# Patient Record
Sex: Male | Born: 1945 | Race: Black or African American | Hispanic: No | Marital: Single | Smoking: Former smoker
Health system: Southern US, Community
[De-identification: ages and names within clinical notes are randomized; demographics above are authoritative.]

## PROBLEM LIST (undated history)

## (undated) DIAGNOSIS — C801 Malignant (primary) neoplasm, unspecified: Secondary | ICD-10-CM

## (undated) DIAGNOSIS — E119 Type 2 diabetes mellitus without complications: Secondary | ICD-10-CM

## (undated) DIAGNOSIS — I10 Essential (primary) hypertension: Secondary | ICD-10-CM

## (undated) HISTORY — PX: HERNIA REPAIR: SHX51

---

## 2003-11-15 ENCOUNTER — Ambulatory Visit: Payer: Self-pay | Admitting: Family Medicine

## 2003-11-16 ENCOUNTER — Ambulatory Visit: Payer: Self-pay | Admitting: *Deleted

## 2003-12-10 ENCOUNTER — Ambulatory Visit: Payer: Self-pay | Admitting: *Deleted

## 2003-12-10 ENCOUNTER — Ambulatory Visit: Payer: Self-pay | Admitting: Family Medicine

## 2003-12-12 ENCOUNTER — Ambulatory Visit: Payer: Self-pay | Admitting: Family Medicine

## 2005-08-20 ENCOUNTER — Emergency Department (HOSPITAL_COMMUNITY): Admission: EM | Admit: 2005-08-20 | Discharge: 2005-08-20 | Payer: Self-pay | Admitting: Emergency Medicine

## 2006-01-07 ENCOUNTER — Emergency Department (HOSPITAL_COMMUNITY): Admission: EM | Admit: 2006-01-07 | Discharge: 2006-01-07 | Payer: Self-pay | Admitting: Emergency Medicine

## 2007-09-18 ENCOUNTER — Emergency Department (HOSPITAL_COMMUNITY): Admission: EM | Admit: 2007-09-18 | Discharge: 2007-09-18 | Payer: Self-pay | Admitting: Emergency Medicine

## 2014-11-23 ENCOUNTER — Emergency Department (HOSPITAL_COMMUNITY): Payer: No Typology Code available for payment source

## 2014-11-23 ENCOUNTER — Emergency Department (HOSPITAL_COMMUNITY)
Admission: EM | Admit: 2014-11-23 | Discharge: 2014-11-23 | Disposition: A | Discharge status: Home / Self Care | Payer: No Typology Code available for payment source | Attending: Emergency Medicine | Admitting: Emergency Medicine

## 2014-11-23 ENCOUNTER — Encounter (HOSPITAL_COMMUNITY): Payer: Self-pay | Admitting: Emergency Medicine

## 2014-11-23 DIAGNOSIS — Y998 Other external cause status: Secondary | ICD-10-CM | POA: Diagnosis not present

## 2014-11-23 DIAGNOSIS — E119 Type 2 diabetes mellitus without complications: Secondary | ICD-10-CM | POA: Diagnosis not present

## 2014-11-23 DIAGNOSIS — Y92410 Unspecified street and highway as the place of occurrence of the external cause: Secondary | ICD-10-CM | POA: Insufficient documentation

## 2014-11-23 DIAGNOSIS — S6991XA Unspecified injury of right wrist, hand and finger(s), initial encounter: Secondary | ICD-10-CM | POA: Diagnosis not present

## 2014-11-23 DIAGNOSIS — S161XXA Strain of muscle, fascia and tendon at neck level, initial encounter: Secondary | ICD-10-CM | POA: Diagnosis not present

## 2014-11-23 DIAGNOSIS — S199XXA Unspecified injury of neck, initial encounter: Secondary | ICD-10-CM | POA: Diagnosis present

## 2014-11-23 DIAGNOSIS — Z87891 Personal history of nicotine dependence: Secondary | ICD-10-CM | POA: Diagnosis not present

## 2014-11-23 DIAGNOSIS — Y9389 Activity, other specified: Secondary | ICD-10-CM | POA: Insufficient documentation

## 2014-11-23 DIAGNOSIS — S39012A Strain of muscle, fascia and tendon of lower back, initial encounter: Secondary | ICD-10-CM | POA: Insufficient documentation

## 2014-11-23 DIAGNOSIS — I10 Essential (primary) hypertension: Secondary | ICD-10-CM | POA: Insufficient documentation

## 2014-11-23 HISTORY — DX: Type 2 diabetes mellitus without complications: E11.9

## 2014-11-23 HISTORY — DX: Essential (primary) hypertension: I10

## 2014-11-23 MED ORDER — NAPROXEN 375 MG PO TABS: 375.0000 mg | ORAL_TABLET | Freq: Two times a day (BID) | ORAL | Status: DC

## 2014-11-23 MED ORDER — HYDROCODONE-ACETAMINOPHEN 5-325 MG PO TABS: 1.0000 | ORAL_TABLET | Freq: Four times a day (QID) | ORAL | Status: DC | PRN

## 2014-11-23 MED ORDER — CYCLOBENZAPRINE HCL 5 MG PO TABS: 5.0000 mg | ORAL_TABLET | Freq: Two times a day (BID) | ORAL | Status: DC | PRN
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
## 2014-11-23 NOTE — Discharge Instructions (Signed)
Do not drive when taking the muscle relaxant because it will make you sleepy

## 2014-11-23 NOTE — ED Notes (Signed)
Pt transported to xray 

## 2014-11-28 ENCOUNTER — Encounter (HOSPITAL_COMMUNITY): Payer: Self-pay | Admitting: Neurology

## 2014-11-28 ENCOUNTER — Emergency Department (HOSPITAL_COMMUNITY)
Admission: EM | Admit: 2014-11-28 | Discharge: 2014-11-28 | Disposition: A | Discharge status: Home / Self Care | Payer: No Typology Code available for payment source | Attending: Emergency Medicine | Admitting: Emergency Medicine

## 2014-11-28 DIAGNOSIS — E119 Type 2 diabetes mellitus without complications: Secondary | ICD-10-CM | POA: Diagnosis not present

## 2014-11-28 DIAGNOSIS — M542 Cervicalgia: Secondary | ICD-10-CM | POA: Diagnosis not present

## 2014-11-28 DIAGNOSIS — Z760 Encounter for issue of repeat prescription: Secondary | ICD-10-CM | POA: Diagnosis present

## 2014-11-28 DIAGNOSIS — I10 Essential (primary) hypertension: Secondary | ICD-10-CM | POA: Insufficient documentation

## 2014-11-28 NOTE — ED Notes (Signed)
Pt walked out saying to send him the bill.  left
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
## 2014-11-28 NOTE — ED Notes (Signed)
Not happy about waiting

## 2017-05-30 ENCOUNTER — Emergency Department (HOSPITAL_COMMUNITY): Payer: Non-veteran care

## 2017-05-30 ENCOUNTER — Inpatient Hospital Stay (HOSPITAL_COMMUNITY)
Admission: EM | Admit: 2017-05-30 | Discharge: 2017-07-06 | DRG: 011 | Disposition: A | Discharge status: Home Health | Payer: Non-veteran care | Attending: Otolaryngology | Admitting: Otolaryngology

## 2017-05-30 ENCOUNTER — Encounter (HOSPITAL_COMMUNITY)
Admission: EM | Disposition: A | Discharge status: Home Health | Payer: Self-pay | Source: Home / Self Care | Attending: Otolaryngology

## 2017-05-30 ENCOUNTER — Encounter (HOSPITAL_COMMUNITY): Payer: Self-pay | Admitting: Certified Registered Nurse Anesthetist

## 2017-05-30 ENCOUNTER — Emergency Department (HOSPITAL_COMMUNITY): Payer: Non-veteran care | Admitting: Certified Registered Nurse Anesthetist

## 2017-05-30 ENCOUNTER — Other Ambulatory Visit: Payer: Self-pay

## 2017-05-30 ENCOUNTER — Inpatient Hospital Stay (HOSPITAL_COMMUNITY): Payer: Non-veteran care

## 2017-05-30 ENCOUNTER — Encounter (HOSPITAL_COMMUNITY): Payer: Self-pay

## 2017-05-30 DIAGNOSIS — Z9002 Acquired absence of larynx: Secondary | ICD-10-CM

## 2017-05-30 DIAGNOSIS — Z87891 Personal history of nicotine dependence: Secondary | ICD-10-CM

## 2017-05-30 DIAGNOSIS — J387 Other diseases of larynx: Secondary | ICD-10-CM | POA: Diagnosis not present

## 2017-05-30 DIAGNOSIS — R451 Restlessness and agitation: Secondary | ICD-10-CM | POA: Diagnosis not present

## 2017-05-30 DIAGNOSIS — E876 Hypokalemia: Secondary | ICD-10-CM | POA: Diagnosis not present

## 2017-05-30 DIAGNOSIS — Z79891 Long term (current) use of opiate analgesic: Secondary | ICD-10-CM

## 2017-05-30 DIAGNOSIS — R1312 Dysphagia, oropharyngeal phase: Secondary | ICD-10-CM | POA: Diagnosis present

## 2017-05-30 DIAGNOSIS — R933 Abnormal findings on diagnostic imaging of other parts of digestive tract: Secondary | ICD-10-CM

## 2017-05-30 DIAGNOSIS — J9509 Other tracheostomy complication: Secondary | ICD-10-CM | POA: Diagnosis not present

## 2017-05-30 DIAGNOSIS — E1165 Type 2 diabetes mellitus with hyperglycemia: Secondary | ICD-10-CM | POA: Diagnosis not present

## 2017-05-30 DIAGNOSIS — K0530 Chronic periodontitis, unspecified: Secondary | ICD-10-CM | POA: Diagnosis present

## 2017-05-30 DIAGNOSIS — N400 Enlarged prostate without lower urinary tract symptoms: Secondary | ICD-10-CM | POA: Diagnosis present

## 2017-05-30 DIAGNOSIS — R440 Auditory hallucinations: Secondary | ICD-10-CM | POA: Diagnosis present

## 2017-05-30 DIAGNOSIS — R443 Hallucinations, unspecified: Secondary | ICD-10-CM | POA: Diagnosis present

## 2017-05-30 DIAGNOSIS — Z008 Encounter for other general examination: Secondary | ICD-10-CM | POA: Diagnosis not present

## 2017-05-30 DIAGNOSIS — K5900 Constipation, unspecified: Secondary | ICD-10-CM | POA: Diagnosis not present

## 2017-05-30 DIAGNOSIS — K029 Dental caries, unspecified: Secondary | ICD-10-CM | POA: Diagnosis present

## 2017-05-30 DIAGNOSIS — Z6820 Body mass index (BMI) 20.0-20.9, adult: Secondary | ICD-10-CM

## 2017-05-30 DIAGNOSIS — R441 Visual hallucinations: Secondary | ICD-10-CM | POA: Diagnosis present

## 2017-05-30 DIAGNOSIS — F4310 Post-traumatic stress disorder, unspecified: Secondary | ICD-10-CM | POA: Diagnosis present

## 2017-05-30 DIAGNOSIS — Y838 Other surgical procedures as the cause of abnormal reaction of the patient, or of later complication, without mention of misadventure at the time of the procedure: Secondary | ICD-10-CM | POA: Diagnosis not present

## 2017-05-30 DIAGNOSIS — Z930 Tracheostomy status: Secondary | ICD-10-CM | POA: Diagnosis not present

## 2017-05-30 DIAGNOSIS — J9690 Respiratory failure, unspecified, unspecified whether with hypoxia or hypercapnia: Secondary | ICD-10-CM

## 2017-05-30 DIAGNOSIS — J432 Centrilobular emphysema: Secondary | ICD-10-CM | POA: Diagnosis not present

## 2017-05-30 DIAGNOSIS — T819XXA Unspecified complication of procedure, initial encounter: Secondary | ICD-10-CM | POA: Diagnosis present

## 2017-05-30 DIAGNOSIS — J9601 Acute respiratory failure with hypoxia: Secondary | ICD-10-CM | POA: Diagnosis present

## 2017-05-30 DIAGNOSIS — J9691 Respiratory failure, unspecified with hypoxia: Secondary | ICD-10-CM | POA: Diagnosis present

## 2017-05-30 DIAGNOSIS — R1313 Dysphagia, pharyngeal phase: Secondary | ICD-10-CM | POA: Diagnosis not present

## 2017-05-30 DIAGNOSIS — R0902 Hypoxemia: Secondary | ICD-10-CM

## 2017-05-30 DIAGNOSIS — F602 Antisocial personality disorder: Secondary | ICD-10-CM | POA: Diagnosis present

## 2017-05-30 DIAGNOSIS — E119 Type 2 diabetes mellitus without complications: Secondary | ICD-10-CM | POA: Diagnosis not present

## 2017-05-30 DIAGNOSIS — R0603 Acute respiratory distress: Secondary | ICD-10-CM

## 2017-05-30 DIAGNOSIS — I10 Essential (primary) hypertension: Secondary | ICD-10-CM | POA: Diagnosis present

## 2017-05-30 DIAGNOSIS — E440 Moderate protein-calorie malnutrition: Secondary | ICD-10-CM | POA: Diagnosis present

## 2017-05-30 DIAGNOSIS — Z7401 Bed confinement status: Secondary | ICD-10-CM

## 2017-05-30 DIAGNOSIS — Z01818 Encounter for other preprocedural examination: Secondary | ICD-10-CM | POA: Diagnosis not present

## 2017-05-30 DIAGNOSIS — T17390A Other foreign object in larynx causing asphyxiation, initial encounter: Secondary | ICD-10-CM | POA: Diagnosis not present

## 2017-05-30 DIAGNOSIS — Z79899 Other long term (current) drug therapy: Secondary | ICD-10-CM

## 2017-05-30 DIAGNOSIS — E11649 Type 2 diabetes mellitus with hypoglycemia without coma: Secondary | ICD-10-CM | POA: Diagnosis not present

## 2017-05-30 DIAGNOSIS — Z430 Encounter for attention to tracheostomy: Secondary | ICD-10-CM | POA: Diagnosis present

## 2017-05-30 DIAGNOSIS — R0600 Dyspnea, unspecified: Secondary | ICD-10-CM

## 2017-05-30 DIAGNOSIS — J449 Chronic obstructive pulmonary disease, unspecified: Secondary | ICD-10-CM | POA: Diagnosis present

## 2017-05-30 DIAGNOSIS — J9600 Acute respiratory failure, unspecified whether with hypoxia or hypercapnia: Secondary | ICD-10-CM

## 2017-05-30 DIAGNOSIS — Z972 Presence of dental prosthetic device (complete) (partial): Secondary | ICD-10-CM

## 2017-05-30 DIAGNOSIS — Z923 Personal history of irradiation: Secondary | ICD-10-CM

## 2017-05-30 DIAGNOSIS — Z781 Physical restraint status: Secondary | ICD-10-CM

## 2017-05-30 DIAGNOSIS — E870 Hyperosmolality and hypernatremia: Secondary | ICD-10-CM | POA: Diagnosis present

## 2017-05-30 DIAGNOSIS — K449 Diaphragmatic hernia without obstruction or gangrene: Secondary | ICD-10-CM

## 2017-05-30 DIAGNOSIS — R34 Anuria and oliguria: Secondary | ICD-10-CM

## 2017-05-30 DIAGNOSIS — C329 Malignant neoplasm of larynx, unspecified: Secondary | ICD-10-CM | POA: Diagnosis present

## 2017-05-30 DIAGNOSIS — R1310 Dysphagia, unspecified: Secondary | ICD-10-CM

## 2017-05-30 DIAGNOSIS — J9500 Unspecified tracheostomy complication: Secondary | ICD-10-CM

## 2017-05-30 HISTORY — PX: TRACHEOSTOMY TUBE PLACEMENT: SHX814

## 2017-05-30 LAB — COMPREHENSIVE METABOLIC PANEL
ALBUMIN: 4.3 g/dL (ref 3.5–5.0)
ALT: 43 U/L (ref 17–63)
AST: 61 U/L — AB (ref 15–41)
Alkaline Phosphatase: 86 U/L (ref 38–126)
Anion gap: 11 (ref 5–15)
BUN: 17 mg/dL (ref 6–20)
CHLORIDE: 95 mmol/L — AB (ref 101–111)
CO2: 34 mmol/L — ABNORMAL HIGH (ref 22–32)
Calcium: 10.2 mg/dL (ref 8.9–10.3)
Creatinine, Ser: 1.45 mg/dL — ABNORMAL HIGH (ref 0.61–1.24)
GFR calc Af Amer: 54 mL/min — ABNORMAL LOW (ref >60)
GFR calc non Af Amer: 47 mL/min — ABNORMAL LOW (ref >60)
GLUCOSE: 240 mg/dL — AB (ref 65–99)
POTASSIUM: 3.9 mmol/L (ref 3.5–5.1)
SODIUM: 140 mmol/L (ref 135–145)
Total Bilirubin: 0.6 mg/dL (ref 0.3–1.2)
Total Protein: 8 g/dL (ref 6.5–8.1)

## 2017-05-30 LAB — RAPID URINE DRUG SCREEN, HOSP PERFORMED
Amphetamines: NOT DETECTED
Barbiturates: NOT DETECTED
Benzodiazepines: NOT DETECTED
Cocaine: NOT DETECTED
OPIATES: POSITIVE — AB
TETRAHYDROCANNABINOL: NOT DETECTED

## 2017-05-30 LAB — CBC WITH DIFFERENTIAL/PLATELET
BASOS ABS: 0 10*3/uL (ref 0.0–0.1)
Basophils Relative: 0 %
EOS ABS: 0 10*3/uL (ref 0.0–0.7)
Eosinophils Relative: 0 %
HCT: 42.1 % (ref 39.0–52.0)
HEMOGLOBIN: 14.3 g/dL (ref 13.0–17.0)
LYMPHS ABS: 0.6 10*3/uL — AB (ref 0.7–4.0)
LYMPHS PCT: 8 %
MCH: 27.7 pg (ref 26.0–34.0)
MCHC: 34 g/dL (ref 30.0–36.0)
MCV: 81.4 fL (ref 78.0–100.0)
Monocytes Absolute: 0.7 10*3/uL (ref 0.1–1.0)
Monocytes Relative: 9 %
NEUTROS PCT: 83 %
Neutro Abs: 6.5 10*3/uL (ref 1.7–7.7)
Platelets: 271 10*3/uL (ref 150–400)
RBC: 5.17 MIL/uL (ref 4.22–5.81)
RDW: 13.1 % (ref 11.5–15.5)
WBC: 7.8 10*3/uL (ref 4.0–10.5)

## 2017-05-30 LAB — GLUCOSE, CAPILLARY
Glucose-Capillary: 311 mg/dL — ABNORMAL HIGH (ref 65–99)
Glucose-Capillary: 248 mg/dL — ABNORMAL HIGH (ref 65–99)

## 2017-05-30 LAB — BLOOD GAS, ARTERIAL
Acid-Base Excess: 2.3 mmol/L — ABNORMAL HIGH (ref 0.0–2.0)
BICARBONATE: 28.7 mmol/L — AB (ref 20.0–28.0)
Drawn by: 232811
FIO2: 60
MECHVT: 620 mL
O2 SAT: 94.1 %
PATIENT TEMPERATURE: 98.1
PCO2 ART: 54.1 mmHg — AB (ref 32.0–48.0)
PEEP: 5 cmH2O
PH ART: 7.342 — AB (ref 7.350–7.450)
PO2 ART: 76 mmHg — AB (ref 83.0–108.0)
RATE: 18 resp/min

## 2017-05-30 LAB — ACETAMINOPHEN LEVEL: Acetaminophen (Tylenol), Serum: <10 ug/mL — ABNORMAL LOW (ref 10–30)

## 2017-05-30 LAB — URINALYSIS, ROUTINE W REFLEX MICROSCOPIC
Bilirubin Urine: NEGATIVE
Glucose, UA: >=500 mg/dL — AB
KETONES UR: 5 mg/dL — AB
NITRITE: NEGATIVE
Specific Gravity, Urine: 1.02 (ref 1.005–1.030)
pH: 5 (ref 5.0–8.0)

## 2017-05-30 LAB — I-STAT CG4 LACTIC ACID, ED: LACTIC ACID, VENOUS: 1.11 mmol/L (ref 0.5–1.9)

## 2017-05-30 LAB — SALICYLATE LEVEL

## 2017-05-30 LAB — I-STAT TROPONIN, ED: TROPONIN I, POC: 0.01 ng/mL (ref 0.00–0.08)

## 2017-05-30 LAB — ETHANOL: Alcohol, Ethyl (B): <10 mg/dL (ref <10)

## 2017-05-30 LAB — BRAIN NATRIURETIC PEPTIDE: B NATRIURETIC PEPTIDE 5: 56.8 pg/mL (ref 0.0–100.0)

## 2017-05-30 LAB — TRIGLYCERIDES: Triglycerides: 64 mg/dL (ref <150)

## 2017-05-30 LAB — MRSA PCR SCREENING: MRSA by PCR: NEGATIVE

## 2017-05-30 SURGERY — CREATION, TRACHEOSTOMY
Anesthesia: General | Site: Neck

## 2017-05-30 MED ORDER — FENTANYL CITRATE (PF) 100 MCG/2ML IJ SOLN: 50.0000 ug | INTRAMUSCULAR | Status: DC | PRN

## 2017-05-30 MED ORDER — DEXMEDETOMIDINE HCL 200 MCG/2ML IV SOLN
INTRAVENOUS | Status: DC | PRN
  Administered 2017-05-30: 6 ug via INTRAVENOUS
  Administered 2017-05-30: 2 ug via INTRAVENOUS

## 2017-05-30 MED ORDER — LACTATED RINGERS IV SOLN
INTRAVENOUS | Status: DC | PRN
  Administered 2017-05-30: 18:00:00 via INTRAVENOUS

## 2017-05-30 MED ORDER — CHLORHEXIDINE GLUCONATE 0.12% ORAL RINSE (MEDLINE KIT)
15.0000 mL | Freq: Two times a day (BID) | OROMUCOSAL | Status: DC
  Administered 2017-05-30 – 2017-06-23 (×43): 15 mL via OROMUCOSAL

## 2017-05-30 MED ORDER — LIDOCAINE-EPINEPHRINE 1 %-1:100000 IJ SOLN
INTRAMUSCULAR | Status: AC
  Filled 2017-05-30: qty 1

## 2017-05-30 MED ORDER — METHYLPREDNISOLONE SODIUM SUCC 125 MG IJ SOLR
125.0000 mg | Freq: Once | INTRAMUSCULAR | Status: AC
  Administered 2017-05-30: 125 mg via INTRAVENOUS
  Filled 2017-05-30: qty 2

## 2017-05-30 MED ORDER — PANTOPRAZOLE SODIUM 40 MG IV SOLR
40.0000 mg | INTRAVENOUS | Status: DC
  Administered 2017-05-30 – 2017-06-10 (×12): 40 mg via INTRAVENOUS
  Filled 2017-05-30 (×12): qty 40

## 2017-05-30 MED ORDER — ROCURONIUM BROMIDE 10 MG/ML (PF) SYRINGE
PREFILLED_SYRINGE | INTRAVENOUS | Status: AC
  Filled 2017-05-30: qty 10

## 2017-05-30 MED ORDER — KETAMINE HCL 10 MG/ML IJ SOLN: 1.0000 mg/kg | INTRAMUSCULAR | Status: DC | PRN

## 2017-05-30 MED ORDER — RACEPINEPHRINE HCL 2.25 % IN NEBU
INHALATION_SOLUTION | RESPIRATORY_TRACT | Status: AC
  Filled 2017-05-30: qty 0.5

## 2017-05-30 MED ORDER — LIDOCAINE-EPINEPHRINE 1 %-1:100000 IJ SOLN
INTRAMUSCULAR | Status: DC | PRN
  Administered 2017-05-30: 6 mL

## 2017-05-30 MED ORDER — IPRATROPIUM-ALBUTEROL 0.5-2.5 (3) MG/3ML IN SOLN
3.0000 mL | Freq: Once | RESPIRATORY_TRACT | Status: AC
  Administered 2017-05-30: 3 mL via RESPIRATORY_TRACT
  Filled 2017-05-30: qty 3

## 2017-05-30 MED ORDER — PHENYLEPHRINE HCL 10 MG/ML IJ SOLN
INTRAMUSCULAR | Status: DC | PRN
  Administered 2017-05-30: 100 ug via INTRAVENOUS
  Administered 2017-05-30: 80 ug via INTRAVENOUS
  Administered 2017-05-30: 100 ug via INTRAVENOUS

## 2017-05-30 MED ORDER — IPRATROPIUM-ALBUTEROL 0.5-2.5 (3) MG/3ML IN SOLN
RESPIRATORY_TRACT | Status: AC
  Filled 2017-05-30: qty 3

## 2017-05-30 MED ORDER — HALOPERIDOL LACTATE 5 MG/ML IJ SOLN
2.0000 mg | Freq: Once | INTRAMUSCULAR | Status: AC
  Administered 2017-05-30: 2 mg via INTRAVENOUS
  Filled 2017-05-30: qty 1

## 2017-05-30 MED ORDER — RACEPINEPHRINE HCL 2.25 % IN NEBU
0.5000 mL | INHALATION_SOLUTION | Freq: Once | RESPIRATORY_TRACT | Status: AC
  Administered 2017-05-30: 0.5 mL via RESPIRATORY_TRACT

## 2017-05-30 MED ORDER — PROPOFOL 1000 MG/100ML IV EMUL
5.0000 ug/kg/min | INTRAVENOUS | Status: DC
  Administered 2017-05-30: 20 ug/kg/min via INTRAVENOUS
  Administered 2017-05-31: 25 ug/kg/min via INTRAVENOUS
  Filled 2017-05-30 (×3): qty 100

## 2017-05-30 MED ORDER — IOPAMIDOL (ISOVUE-370) INJECTION 76%
INTRAVENOUS | Status: AC
  Administered 2017-05-30: 100 mL via INTRAVENOUS
  Filled 2017-05-30: qty 100

## 2017-05-30 MED ORDER — BUDESONIDE 0.5 MG/2ML IN SUSP
0.5000 mg | Freq: Two times a day (BID) | RESPIRATORY_TRACT | Status: DC
  Administered 2017-05-30 – 2017-06-23 (×48): 0.5 mg via RESPIRATORY_TRACT
  Filled 2017-05-30 (×48): qty 2

## 2017-05-30 MED ORDER — ARFORMOTEROL TARTRATE 15 MCG/2ML IN NEBU
15.0000 ug | INHALATION_SOLUTION | Freq: Two times a day (BID) | RESPIRATORY_TRACT | Status: DC
  Administered 2017-05-30 – 2017-06-23 (×48): 15 ug via RESPIRATORY_TRACT
  Filled 2017-05-30 (×48): qty 2

## 2017-05-30 MED ORDER — IPRATROPIUM-ALBUTEROL 0.5-2.5 (3) MG/3ML IN SOLN
3.0000 mL | Freq: Four times a day (QID) | RESPIRATORY_TRACT | Status: DC
  Administered 2017-05-30 – 2017-05-31 (×3): 3 mL via RESPIRATORY_TRACT
  Filled 2017-05-30 (×2): qty 3

## 2017-05-30 MED ORDER — KETAMINE HCL 10 MG/ML IJ SOLN
INTRAMUSCULAR | Status: DC | PRN
  Administered 2017-05-30: 10 mg via INTRAVENOUS
  Administered 2017-05-30: 5 mg via INTRAVENOUS

## 2017-05-30 MED ORDER — ROCURONIUM BROMIDE 100 MG/10ML IV SOLN
INTRAVENOUS | Status: DC | PRN
  Administered 2017-05-30: 30 mg via INTRAVENOUS
  Administered 2017-05-30: 20 mg via INTRAVENOUS

## 2017-05-30 MED ORDER — PROPOFOL 10 MG/ML IV BOLUS
INTRAVENOUS | Status: AC
  Filled 2017-05-30: qty 40

## 2017-05-30 MED ORDER — FENTANYL CITRATE (PF) 100 MCG/2ML IJ SOLN
50.0000 ug | INTRAMUSCULAR | Status: DC | PRN
  Administered 2017-05-31: 50 ug via INTRAVENOUS
  Filled 2017-05-30: qty 2

## 2017-05-30 MED ORDER — DEXMEDETOMIDINE HCL IN NACL 200 MCG/50ML IV SOLN
INTRAVENOUS | Status: AC
  Filled 2017-05-30: qty 50

## 2017-05-30 MED ORDER — MAGNESIUM SULFATE 2 GM/50ML IV SOLN
2.0000 g | Freq: Once | INTRAVENOUS | Status: AC
  Administered 2017-05-30: 2 g via INTRAVENOUS
  Filled 2017-05-30: qty 50

## 2017-05-30 MED ORDER — INSULIN ASPART 100 UNIT/ML ~~LOC~~ SOLN
0.0000 [IU] | SUBCUTANEOUS | Status: DC
  Administered 2017-05-30: 5 [IU] via SUBCUTANEOUS
  Administered 2017-05-30: 11 [IU] via SUBCUTANEOUS
  Administered 2017-05-31 – 2017-06-01 (×3): 2 [IU] via SUBCUTANEOUS
  Administered 2017-06-02: 3 [IU] via SUBCUTANEOUS
  Administered 2017-06-02 – 2017-06-04 (×7): 2 [IU] via SUBCUTANEOUS
  Administered 2017-06-04: 3 [IU] via SUBCUTANEOUS
  Administered 2017-06-04 – 2017-06-05 (×3): 2 [IU] via SUBCUTANEOUS
  Administered 2017-06-05 – 2017-06-06 (×8): 3 [IU] via SUBCUTANEOUS
  Administered 2017-06-06: 2 [IU] via SUBCUTANEOUS
  Administered 2017-06-06 – 2017-06-07 (×2): 3 [IU] via SUBCUTANEOUS
  Administered 2017-06-07: 2 [IU] via SUBCUTANEOUS
  Administered 2017-06-07: 5 [IU] via SUBCUTANEOUS
  Administered 2017-06-07: 3 [IU] via SUBCUTANEOUS
  Administered 2017-06-07: 5 [IU] via SUBCUTANEOUS
  Administered 2017-06-08: 2 [IU] via SUBCUTANEOUS
  Administered 2017-06-08 (×2): 3 [IU] via SUBCUTANEOUS
  Administered 2017-06-08: 2 [IU] via SUBCUTANEOUS
  Administered 2017-06-08: 3 [IU] via SUBCUTANEOUS
  Administered 2017-06-09: 2 [IU] via SUBCUTANEOUS
  Administered 2017-06-09 – 2017-06-10 (×7): 3 [IU] via SUBCUTANEOUS
  Administered 2017-06-10: 2 [IU] via SUBCUTANEOUS
  Administered 2017-06-10 (×2): 3 [IU] via SUBCUTANEOUS
  Administered 2017-06-10: 2 [IU] via SUBCUTANEOUS
  Administered 2017-06-11 (×5): 3 [IU] via SUBCUTANEOUS
  Administered 2017-06-11 – 2017-06-12 (×2): 2 [IU] via SUBCUTANEOUS
  Administered 2017-06-12: 3 [IU] via SUBCUTANEOUS
  Administered 2017-06-12 (×3): 2 [IU] via SUBCUTANEOUS
  Administered 2017-06-12: 3 [IU] via SUBCUTANEOUS
  Administered 2017-06-13 (×3): 5 [IU] via SUBCUTANEOUS
  Administered 2017-06-13 (×2): 3 [IU] via SUBCUTANEOUS
  Administered 2017-06-13 – 2017-06-14 (×2): 5 [IU] via SUBCUTANEOUS
  Administered 2017-06-14: 3 [IU] via SUBCUTANEOUS
  Administered 2017-06-14 (×2): 5 [IU] via SUBCUTANEOUS
  Administered 2017-06-14 (×2): 3 [IU] via SUBCUTANEOUS
  Administered 2017-06-14 – 2017-06-15 (×2): 5 [IU] via SUBCUTANEOUS
  Administered 2017-06-15: 3 [IU] via SUBCUTANEOUS
  Administered 2017-06-15: 2 [IU] via SUBCUTANEOUS
  Administered 2017-06-15 (×2): 3 [IU] via SUBCUTANEOUS
  Administered 2017-06-16: 2 [IU] via SUBCUTANEOUS
  Administered 2017-06-16 (×3): 3 [IU] via SUBCUTANEOUS
  Administered 2017-06-16: 5 [IU] via SUBCUTANEOUS
  Administered 2017-06-17: 2 [IU] via SUBCUTANEOUS
  Administered 2017-06-17: 3 [IU] via SUBCUTANEOUS
  Administered 2017-06-17: 2 [IU] via SUBCUTANEOUS
  Administered 2017-06-17: 3 [IU] via SUBCUTANEOUS
  Administered 2017-06-18: 2 [IU] via SUBCUTANEOUS
  Administered 2017-06-18: 3 [IU] via SUBCUTANEOUS
  Administered 2017-06-18: 2 [IU] via SUBCUTANEOUS
  Administered 2017-06-18: 3 [IU] via SUBCUTANEOUS
  Administered 2017-06-18: 8 [IU] via SUBCUTANEOUS
  Administered 2017-06-18: 3 [IU] via SUBCUTANEOUS
  Administered 2017-06-19: 5 [IU] via SUBCUTANEOUS
  Administered 2017-06-19: 8 [IU] via SUBCUTANEOUS
  Administered 2017-06-19: 5 [IU] via SUBCUTANEOUS
  Administered 2017-06-19 (×2): 3 [IU] via SUBCUTANEOUS
  Administered 2017-06-20: 8 [IU] via SUBCUTANEOUS
  Administered 2017-06-20 (×2): 5 [IU] via SUBCUTANEOUS
  Administered 2017-06-20: 8 [IU] via SUBCUTANEOUS
  Administered 2017-06-20: 3 [IU] via SUBCUTANEOUS
  Administered 2017-06-21: 5 [IU] via SUBCUTANEOUS
  Administered 2017-06-21: 8 [IU] via SUBCUTANEOUS
  Administered 2017-06-21: 5 [IU] via SUBCUTANEOUS
  Administered 2017-06-21: 3 [IU] via SUBCUTANEOUS
  Administered 2017-06-21: 11 [IU] via SUBCUTANEOUS
  Administered 2017-06-21: 8 [IU] via SUBCUTANEOUS
  Administered 2017-06-21: 3 [IU] via SUBCUTANEOUS
  Administered 2017-06-22: 5 [IU] via SUBCUTANEOUS
  Administered 2017-06-22: 2 [IU] via SUBCUTANEOUS
  Administered 2017-06-22: 3 [IU] via SUBCUTANEOUS
  Administered 2017-06-22: 5 [IU] via SUBCUTANEOUS
  Administered 2017-06-22: 8 [IU] via SUBCUTANEOUS
  Administered 2017-06-23: 2 [IU] via SUBCUTANEOUS
  Administered 2017-06-23: 3 [IU] via SUBCUTANEOUS
  Administered 2017-06-23 (×2): 8 [IU] via SUBCUTANEOUS
  Administered 2017-06-23: 5 [IU] via SUBCUTANEOUS
  Administered 2017-06-23: 3 [IU] via SUBCUTANEOUS
  Administered 2017-06-24: 8 [IU] via SUBCUTANEOUS
  Administered 2017-06-24: 15 [IU] via SUBCUTANEOUS
  Administered 2017-06-24 (×2): 3 [IU] via SUBCUTANEOUS
  Administered 2017-06-25 (×2): 8 [IU] via SUBCUTANEOUS
  Administered 2017-06-25 (×2): 3 [IU] via SUBCUTANEOUS
  Administered 2017-06-25: 8 [IU] via SUBCUTANEOUS
  Administered 2017-06-26 (×2): 5 [IU] via SUBCUTANEOUS
  Administered 2017-06-26: 2 [IU] via SUBCUTANEOUS
  Administered 2017-06-26 – 2017-06-27 (×4): 5 [IU] via SUBCUTANEOUS
  Administered 2017-06-28: 8 [IU] via SUBCUTANEOUS

## 2017-05-30 MED ORDER — SODIUM CHLORIDE 0.9 % IV SOLN
INTRAVENOUS | Status: DC | PRN
  Administered 2017-05-30: 18:00:00 via INTRAVENOUS

## 2017-05-30 MED ORDER — KETAMINE HCL 10 MG/ML IJ SOLN
INTRAMUSCULAR | Status: AC
  Filled 2017-05-30: qty 1

## 2017-05-30 MED ORDER — ALBUTEROL (5 MG/ML) CONTINUOUS INHALATION SOLN
15.0000 mg/h | INHALATION_SOLUTION | RESPIRATORY_TRACT | Status: DC
  Administered 2017-05-30: 15 mg/h via RESPIRATORY_TRACT
  Filled 2017-05-30: qty 20

## 2017-05-30 MED ORDER — PHENYLEPHRINE 40 MCG/ML (10ML) SYRINGE FOR IV PUSH (FOR BLOOD PRESSURE SUPPORT)
PREFILLED_SYRINGE | INTRAVENOUS | Status: AC
  Filled 2017-05-30: qty 10

## 2017-05-30 MED ORDER — ORAL CARE MOUTH RINSE
15.0000 mL | Freq: Four times a day (QID) | OROMUCOSAL | Status: DC
  Administered 2017-05-30 – 2017-06-30 (×102): 15 mL via OROMUCOSAL

## 2017-05-30 SURGICAL SUPPLY — 32 items
ATTRACTOMAT 16X20 MAGNETIC DRP (DRAPES) IMPLANT
BAG SPEC THK2 15X12 ZIP CLS (MISCELLANEOUS) ×1
BAG ZIPLOCK 12X15 (MISCELLANEOUS) ×3 IMPLANT
BLADE CLIPPER SURG (BLADE) IMPLANT
BLADE MINI RND TIP GREEN BEAV (BLADE) ×3 IMPLANT
BLADE SURG 15 STRL LF DISP TIS (BLADE) ×1 IMPLANT
BLADE SURG 15 STRL SS (BLADE) ×2
BLADE SURG SZ11 CARB STEEL (BLADE) ×3 IMPLANT
CLEANER TIP ELECTROSURG 2X2 (MISCELLANEOUS) ×3 IMPLANT
COVER SURGICAL LIGHT HANDLE (MISCELLANEOUS) ×3 IMPLANT
DISSECTOR ROUND CHERRY 3/8 STR (MISCELLANEOUS) IMPLANT
ELECT COATED BLADE 2.86 ST (ELECTRODE) ×3 IMPLANT
ELECT REM PT RETURN 15FT ADLT (MISCELLANEOUS) ×3 IMPLANT
GAUZE SPONGE 4X4 16PLY XRAY LF (GAUZE/BANDAGES/DRESSINGS) ×3 IMPLANT
GAUZE XEROFORM 5X9 LF (GAUZE/BANDAGES/DRESSINGS) IMPLANT
GLOVE ECLIPSE 7.5 STRL STRAW (GLOVE) ×3 IMPLANT
HOLDER TRACH TUBE VELCRO 19.5 (MISCELLANEOUS) ×3 IMPLANT
KIT BASIN OR (CUSTOM PROCEDURE TRAY) ×3 IMPLANT
NEEDLE HYPO 25X1 1.5 SAFETY (NEEDLE) ×3 IMPLANT
NS IRRIG 1000ML POUR BTL (IV SOLUTION) ×3 IMPLANT
PACK EENT SPLIT (PACKS) ×3 IMPLANT
PENCIL FOOT CONTROL (ELECTRODE) ×3 IMPLANT
SPONGE DRAIN TRACH 4X4 STRL 2S (GAUZE/BANDAGES/DRESSINGS) ×6 IMPLANT
SUT CHROMIC 2 0 SH (SUTURE) IMPLANT
SUT ETHILON 3 0 PS 1 (SUTURE) ×6 IMPLANT
SUT SILK 4-0 12X30IN (SUTURE) ×3 IMPLANT
SYR 10ML LL (SYRINGE) ×3 IMPLANT
SYR 20CC LL (SYRINGE) IMPLANT
SYR CONTROL 10ML LL (SYRINGE) ×3 IMPLANT
TUBE TRACH SHILEY  6 DIST  CUF (TUBING) ×3 IMPLANT
TUBE TRACH SHILEY 4 DIST CUF (TUBING) IMPLANT
YANKAUER SUCT BULB TIP 10FT TU (MISCELLANEOUS) ×3 IMPLANT

## 2017-05-30 NOTE — Consult Note (Signed)
  Asked to see this patient with stridor and respiratory distress.  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  There is not much other history available.  On examination, he has a facemask with oxygen in place.  He is sitting up having severe inspiratory stridor and airway obstruction.  He is unable to talk.  He is maintaining saturations but he is in extremis.  We will taken to the operating room immediately for emergent tracheostomy, laryngoscopy and biopsy.
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
## 2017-05-30 NOTE — Consult Note (Addendum)
Name: Vincent Hansen MRN: [id number] DOB: 06/17/1945    ADMISSION DATE:  05/30/2017 CONSULTATION DATE:  05/30/2017  REFERRING MD :  Dr. Constance Holster   CHIEF COMPLAINT:  Laryngeal Mass   HISTORY OF PRESENT ILLNESS:   72 year old male with PMH of HTN, COPD, DM  Presents to ED on 7/20 with Police. In the last 24 hours patient had called Police to the home 3-4 times with active hallucinations believing people were in the home trying to harm him. Upon arrival to the ED patient is noted to be hypoxic with inspiratory stridor. Given Solu-medrol, DuoNeb, and racemic epi with no improvement. CT neck with severe narrowing of the supraglottic airway related to mass-like circumferential soft tissue suspicious for laryngeal neoplasm. ENT consulted. Patient taken to OR for emergent tracheostomy. PCCM consulted.   SIGNIFICANT EVENTS  3/17 > Presents to ED   STUDIES:  CT Head 3/17 > . Normal for age non contrast CT appearance of the brain allowing for some motion artifact. 2. Mild left posterior convexity scalp hematoma suspected. No underlying skull fracture. CTA Chest 3/17 > 1. Supraglottic airway narrowing related to mass-like soft tissue which is circumferential but greater on the right. No lower cervical or thoracic inlet lymphadenopathy is evident. Recommend ENT consultation and see also CT Neck reported separately. 2. No acute pulmonary embolus identified although the bilateral lower lobe pulmonary arteries are not well evaluated due to motion. 3. Small volume of retained or aspirated secretions in the trachea and both mainstem bronchi, but the major airways remain patent. 4. Confluent dependent bilateral lower lobe opacity which is beyond that typical of atelectasis. In light of #3, pulmonary aspiration is possible. No pleural effusion. 5. Underlying Emphysema (ICD10-J43.9). CT Neck 3/17 > 1. Moderate to severe narrowing of the supraglottic airway (numerically estimated at 85%) appears related  to masslike circumferential soft tissue. The appearance is suspicious for a laryngeal neoplasm but supraglottic inflammation or other etiology remains possible. Motion artifact on this study is such that the finding may have been better demonstrated on the chest CTA today. 2. No lymphadenopathy is evident. And other neck soft tissues appear within normal limits when allowing for motion artifact.  PAST MEDICAL HISTORY :   has a past medical history of Diabetes mellitus without complication ([place]) and Hypertension.  has a past surgical history that includes Hernia repair. Prior to Admission medications   Medication Sig Start Date End Date Taking? Authorizing Provider  albuterol (PROVENTIL HFA;VENTOLIN HFA) 108 (90 Base) MCG/ACT inhaler Inhale 1 puff into the lungs every 4 (four) hours as needed for wheezing or shortness of breath.   Yes [provider]  HYDROcodone-acetaminophen (NORCO) 5-325 MG per tablet Take 1 tablet by mouth every 6 (six) hours as needed. Patient not taking: Reported on 05/30/2017 11/23/14   Ashley Murrain, NP   No Known Allergies  FAMILY HISTORY:  family history is not on file. SOCIAL HISTORY:  reports that he has quit smoking. he has never used smokeless tobacco. He reports that he does not drink alcohol.  REVIEW OF SYSTEMS:   Unable to review as patient is intubated and sedated   SUBJECTIVE:   VITAL SIGNS: Temp:  [98.2 F (36.8 C)] 98.2 F (36.8 C) (03/17 1256) Pulse Rate:  [85-134] 124 (03/17 1846) Resp:  [12-34] 18 (03/17 1846) BP: (78-177)/(51-102) 105/72 (03/17 1835) SpO2:  [84 %-100 %] 100 % (03/17 1846) FiO2 (%):  [60 %] 60 % (03/17 1846) Weight:  [91 kg (200 lb 9.9  oz)] 91 kg (200 lb 9.9 oz) (03/17 1713)  PHYSICAL EXAMINATION: General:  Sedated, not in acute distress Neuro:  No focal deficits, awake and responsive to painful stimuli HEENT:  Normocephalic, tracheostomy in place no active bleeding or swelling around surgical  site Cardiovascular:  S1 and S2 appreciated Lungs:  Coarse breath sounds bilaterally Abdomen:  Soft non distended abdomen with BS Musculoskeletal:  No gross deformities Skin:  Grossly intact  Recent Labs  Lab 05/30/17 1330  NA 140  K 3.9  CL 95*  CO2 34*  BUN 17  CREATININE 1.45*  GLUCOSE 240*   Recent Labs  Lab 05/30/17 1330  HGB 14.3  HCT 42.1  WBC 7.8  PLT 271   Ct Head Wo Contrast  Result Date: 05/30/2017 CLINICAL DATA:  72 year old male with hallucinations, shortness of breath, stridor. EXAM: CT HEAD WITHOUT CONTRAST TECHNIQUE: Contiguous axial images were obtained from the base of the skull through the vertex without intravenous contrast. COMPARISON:  No prior head CT. FINDINGS: Study is mildly degraded by motion artifact despite repeated imaging attempts. Brain: Cerebral volume is within normal limits for age. Incidental dural calcifications. No midline shift, ventriculomegaly, mass effect, evidence of mass lesion, intracranial hemorrhage or evidence of cortically based acute infarction. Gray-white matter differentiation is within normal limits throughout the brain. Vascular: Calcified atherosclerosis at the skull base. Skull: Negative. Sinuses/Orbits: Clear. Other: Visualized orbits soft tissues are within normal limits. Possible broad-based left posterior convexity scalp hematoma measuring up to 7 millimeters in thickness (series 8, image 19). Underlying calvarium appears intact. Other scalp soft tissues appear negative. IMPRESSION: 1. Normal for age non contrast CT appearance of the brain allowing for some motion artifact. 2. Mild left posterior convexity scalp hematoma suspected. No underlying skull fracture. Electronically Signed   By: Genevie Ann M.D.   On: 05/30/2017 17:12   Ct Soft Tissue Neck W Contrast  Addendum Date: 05/30/2017   ADDENDUM REPORT: 05/30/2017 17:44 ADDENDUM: Study discussed by telephone with Dr. Laverta Baltimore in the ED on 05/30/2017 at 1734 hours. Electronically  Signed   By: Genevie Ann M.D.   On: 05/30/2017 17:44   Result Date: 05/30/2017 CLINICAL DATA:  72 year old male with shortness of breath, stridor. EXAM: CT NECK WITH CONTRAST TECHNIQUE: Multidetector CT imaging of the neck was performed using the standard protocol following the bolus administration of intravenous contrast. CONTRAST:  100 milliliters ISOVUE-370 IOPAMIDOL (ISOVUE-370) INJECTION 76% in conjunction with contrast enhanced imaging of the chest reported separately. COMPARISON:  Chest CTA and noncontrast head CT today reported separately. FINDINGS: Study is moderately degraded by motion. Pharynx and larynx: There is pronounced motion artifact at the glottis, supraglottic larynx, hypopharynx and oropharynx. Subsequently, the supraglottic larynx was probably better demonstrated on the chest CTA today. Masslike circumferential intermediate density in the supraglottic larynx with an estimated 85% narrowing of the airway when considering cross-sectional area. As on the chest CT today this appears somewhat eccentric to the right. The laryngeal cartilages are not overtly destroyed. No definite para-laryngeal space invasion. There is only mild generalized thickening of the epiglottis. The hypopharynx and oropharynx are patent. The nasopharyngeal contour is within normal limits. Grossly negative parapharyngeal and retropharyngeal spaces. Salivary glands: Negative Lau in for motion artifact. Thyroid: Negative. Lymph nodes: No cervical lymphadenopathy is evident. Vascular: The bilateral carotid arteries and jugular veins appear patent to the skull base. The bilateral vertebral arteries are patent. There is calcified atherosclerosis in the neck and at the skull base. Limited intracranial: Negative. Visualized orbits: Negative.  Mastoids and visualized paranasal sinuses: Mild sinus mucosal thickening. Skeleton: Widespread severe cervical spine degeneration. No acute osseous abnormality identified. Upper chest: Reported  separately today. IMPRESSION: 1. Moderate to severe narrowing of the supraglottic airway (numerically estimated at 85%) appears related to masslike circumferential soft tissue. The appearance is suspicious for a laryngeal neoplasm but supraglottic inflammation or other etiology remains possible. Motion artifact on this study is such that the finding may have been better demonstrated on the chest CTA today. 2. No lymphadenopathy is evident. And other neck soft tissues appear within normal limits when allowing for motion artifact. Electronically Signed: By: Genevie Ann M.D. On: 05/30/2017 17:32   Ct Angio Chest Pe W/cm &/or Wo Cm  Result Date: 05/30/2017 CLINICAL DATA:  72 year old male with shortness of breath, stridor. EXAM: CT ANGIOGRAPHY CHEST WITH CONTRAST TECHNIQUE: Multidetector CT imaging of the chest was performed using the standard protocol during bolus administration of intravenous contrast. Multiplanar CT image reconstructions and MIPs were obtained to evaluate the vascular anatomy. CONTRAST:  100 milliliters ISOVUE-370 IOPAMIDOL (ISOVUE-370) INJECTION 76% COMPARISON:  Portable chest x-ray 1327 hours today. FINDINGS: Cardiovascular: Suboptimal contrast bolus timing in the pulmonary arterial tree. Superimposed lower lung respiratory motion artifact. There is no central or hilar pulmonary artery filling defect. The upper lobe pulmonary arteries also appear patent. The lower lobe pulmonary arteries are not well evaluated. Borderline to mild cardiomegaly. No pericardial effusion. Mild Calcified aortic atherosclerosis. Calcified coronary artery atherosclerosis is suspected. Mediastinum/Nodes: No mediastinal lymphadenopathy. Lungs/Pleura: The trachea is patent with globular retained secretions along the right lateral wall of the trachea and tracking into the right mainstem bronchus, but the right side airways remain patent. There is mild retained secretions in the left mainstem bronchus. Bilateral centrilobular  emphysema. The upper lobes are clear. Right middle lobe is clear aside from mild medial segment scarring or atelectasis. There is confluent bilateral dependent/peripheral lower lobe opacity which appears atypical for atelectasis. There is mild similar confluent opacity in the dependent lingula. No pleural effusion. Upper Abdomen: Negative visible liver, spleen, pancreas, and adrenal glands. The visible kidneys appear normal aside from upper pole simple appearing cyst. The visible stomach is distended. There is intermittent gas in the thoracic esophagus, but overall the esophagus remains within normal limits. Musculoskeletal: Advanced cervical spine degeneration. No acute osseous abnormality identified. Other findings: The subglottic larynx and vocal cord level appear within normal limits (series 2, image 21). There is masslike soft tissue thickening in the supraglottic larynx which is somewhat circumferential but more pronounced on the right (series 3, image 24). There is subsequent mild to moderate narrowing of the supraglottic airway. No lower cervical lymphadenopathy is evident. Review of the MIP images confirms the above findings. IMPRESSION: 1. Supraglottic airway narrowing related to mass-like soft tissue which is circumferential but greater on the right. No lower cervical or thoracic inlet lymphadenopathy is evident. Recommend ENT consultation and see also CT Neck reported separately. 2. No acute pulmonary embolus identified although the bilateral lower lobe pulmonary arteries are not well evaluated due to motion. 3. Small volume of retained or aspirated secretions in the trachea and both mainstem bronchi, but the major airways remain patent. 4. Confluent dependent bilateral lower lobe opacity which is beyond that typical of atelectasis. In light of #3, pulmonary aspiration is possible. No pleural effusion. 5. Underlying Emphysema (ICD10-J43.9). Electronically Signed   By: Genevie Ann M.D.   On: 05/30/2017 17:22    Dg Chest Port 1 View  Result Date: 05/30/2017 CLINICAL DATA:  Hypoxemia.  Ex smoker. EXAM: PORTABLE CHEST 1 VIEW COMPARISON:  None. FINDINGS: The heart is mildly enlarged. There is volume loss at the LEFT base, elevated hemidiaphragm, likely subsegmental atelectasis with small effusion. Early consolidation not excluded. RIGHT lung is clear. No visible bony abnormality. IMPRESSION: LEFT base process of uncertain etiology. Elevated LEFT hemidiaphragm, with LEFT effusion. Subsegmental atelectasis with or without consolidation is possible. Cardiomegaly. Electronically Signed   By: Staci Righter M.D.   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  The post cricoid mucosa was edematous but there is no obvious tumor.  The cords were not involved.  The tumor did not appear to cross the midline.  The subglottic larynx was completely clear.  Multiple biopsies were taken from the tumor and sent for pathologic evaluation. -F/U Pathology on Biopsies -Vent Support: FiO2 60% PEEP 5  -Trend CXR/ABG  -Pulmonary Hygiene  -? Emphysematous changes on imagine started on Duonebs  Hyperglycemia  H/O DM Plan  -Trend Glucose  -SSI  - Goal BG 180mg /dl  Acute Delirium with Hallucinations secondary to hypoxia   Sedative Needs  Plan  -RASS goal 0/-1 -not currently on Propofol  - start Propofol to achieve RASS and keep pt comfortable post procedure -PRN Fentanyl   CC TIME: 45 mins CODE STATUS: Full DISPOSITION; ICU PROGNOSIS: Guarded FAMILY: none at bedside at time of evaluation  Kandice Hams MD Pulmonary and [place]  05/30/2017, 7:27 PM

## 2017-05-30 NOTE — ED Provider Notes (Signed)
Blood pressure (!) 177/102, pulse (!) 120, temperature 98.2 F (36.8 C), temperature source Oral, resp. rate 18, SpO2 92 %.  Assuming care from Dr. Rex Kras. In short, Vincent Hansen is a 72 y.o. male with a chief complaint of Mental Health Problem .  Refer to the original H&P for additional details.  The current plan of care is to the ED under IVC from [facility] with confusion. Notable stridor here not responding to medical therapy. CT neck, chest, and head ordered by Dr. Rex Kras. Hypoxemia on arrival. Lower lip swelling found to be patient's baseline.   05:08 PM Spoke with Dr. Constance Holster regarding the CT neck findings. Radiology read pending. He will look over CT and call back. Patient is delirious but maintaining airway at this time. He is pulling out IV with some confusion so have ordered soft wrist restraints. Nursing is able to redirect. Continues to have stridor. Will not provide additional sedation given concern for airway.   05:35 PM Spoke with Radiology regarding the CT results. These correlate well clinically. Explained results to patient. ENT on the way to the ED. Airway equipment to bedside. Ketamine at bedside as needed. Plan to go to OR for definitive airway mgmt.   05:45 PM Patient rolling up with ENT and anesthesia. Appreciate assistance with this difficult case.   CRITICAL CARE Performed by: Margette Fast Total critical care time: 35 minutes Critical care time was exclusive of separately billable procedures and treating other patients. Critical care was necessary to treat or prevent imminent or life-threatening deterioration. Critical care was time spent personally by me on the following activities: development of treatment plan with patient and/or surrogate as well as nursing, discussions with consultants, evaluation of patient's response to treatment, examination of patient, obtaining history from patient or surrogate, ordering and performing treatments and interventions, ordering  and review of laboratory studies, ordering and review of radiographic studies, pulse oximetry and re-evaluation of patient's condition.  Nanda Quinton, MD Emergency Medicine    Mallory Schaad, [facility], MD 05/30/17 [phone]

## 2017-05-30 NOTE — ED Provider Notes (Addendum)
[facility] DEPT Provider Note   CSN: [id number] Arrival date & time: 05/30/17  1222     History   Chief Complaint Chief Complaint  Patient presents with  . Mental Health Problem    HPI Vincent Hansen is a 72 y.o. male.  72yo M w/ PMH including HTN, COPD and T2DM who p/w sedations in IVC.  Police report that the patient call them to the home 3-4 times in the past 24 hours each time, they noted that he seemed to be responding to internal stimuli, talking to people who were not actually there and he appeared to be having hallucinations.  He referred to a "Mr. Wynetta Emery who is trying to harm me" and was up all night.  Today at the last call out, they contacted the crisis line, who did assessment and IVC'd patient for auditory and visual hallucinations, concern for safety.   On arrival here, pt noted to be hypoxic. He states he has been short of breath for a while. He denies cough or chest pain. He states he always has a prominent lower lip and denies any sensation of facial swelling.  LEVEL 5 CAVEAT DUE TO PSYCHIATRIC ILLNESS   The history is provided by the police.  Mental Health Problem    Past Medical History:  Diagnosis Date  . Diabetes mellitus without complication ([place])   . Hypertension     There are no active problems to display for this patient.   Past Surgical History:  Procedure Laterality Date  . HERNIA REPAIR         Home Medications    Prior to Admission medications   Medication Sig Start Date End Date Taking? Authorizing Provider  albuterol (PROVENTIL HFA;VENTOLIN HFA) 108 (90 Base) MCG/ACT inhaler Inhale 1 puff into the lungs every 4 (four) hours as needed for wheezing or shortness of breath.   Yes [provider]  HYDROcodone-acetaminophen (NORCO) 5-325 MG per tablet Take 1 tablet by mouth every 6 (six) hours as needed. Patient not taking: Reported on 05/30/2017 11/23/14   Ashley Murrain, NP    Family History No  family history on file.  Social History Social History   Tobacco Use  . Smoking status: Former Research scientist (life sciences)  . Smokeless tobacco: Never Used  Substance Use Topics  . Alcohol use: No  . Drug use: Not on file     Allergies   Patient has no known allergies.   Review of Systems Review of Systems  Unable to perform ROS: Psychiatric disorder     Physical Exam Updated Vital Signs BP (!) 154/92 (BP Location: Left Arm)   Pulse (!) 128   Temp 98.2 F (36.8 C) (Oral)   Resp (!) 30   SpO2 95%   Physical Exam  Constitutional: He appears well-developed and well-nourished.  Stridulous and in mild respiratory distress  HENT:  Head: Normocephalic and atraumatic.  Moist mucous membranes Prominent lower lip, no tongue or oropharyngeal swelling  Eyes: Conjunctivae are normal.  Neck: Neck supple. No tracheal deviation present.  Cardiovascular: Regular rhythm and normal heart sounds. Tachycardia present.  No murmur heard. Pulmonary/Chest:  Increased WOB with inspiratory stridor, difficult to appreciate lung sounds due to referred noise from upper airways  Abdominal: Soft. Bowel sounds are normal. He exhibits no distension. There is no tenderness.  Musculoskeletal: He exhibits no edema.  Neurological: He is alert.  Fluent speech, moving all 4 ext  Skin: Skin is warm and dry.  Psychiatric:  Bizarre affect, occasionally  making hand motions which appear to be response to visual hallucinations  Nursing note and vitals reviewed.    ED Treatments / Results  Labs (all labs ordered are listed, but only abnormal results are displayed) Labs Reviewed  COMPREHENSIVE METABOLIC PANEL - Abnormal; Notable for the following components:      Result Value   Chloride 95 (*)    CO2 34 (*)    Glucose, Bld 240 (*)    Creatinine, Ser 1.45 (*)    AST 61 (*)    GFR calc non Af Amer 47 (*)    GFR calc Af Amer 54 (*)    All other components within normal limits  ACETAMINOPHEN LEVEL - Abnormal; Notable  for the following components:   Acetaminophen (Tylenol), Serum <10 (*)    All other components within normal limits  CBC WITH DIFFERENTIAL/PLATELET - Abnormal; Notable for the following components:   Lymphs Abs 0.6 (*)    All other components within normal limits  BLOOD GAS, VENOUS - Abnormal; Notable for the following components:   pCO2, Ven 66.9 (*)    Bicarbonate 33.9 (*)    Acid-Base Excess 5.9 (*)    All other components within normal limits  RAPID URINE DRUG SCREEN, HOSP PERFORMED - Abnormal; Notable for the following components:   Opiates POSITIVE (*)    All other components within normal limits  URINALYSIS, ROUTINE W REFLEX MICROSCOPIC - Abnormal; Notable for the following components:   Glucose, UA >=500 (*)    Hgb urine dipstick MODERATE (*)    Ketones, ur 5 (*)    Protein, ur >=300 (*)    Leukocytes, UA TRACE (*)    Bacteria, UA FEW (*)    Squamous Epithelial / LPF 0-5 (*)    All other components within normal limits  ETHANOL  SALICYLATE LEVEL  BRAIN NATRIURETIC PEPTIDE  BLOOD GAS, VENOUS  I-STAT TROPONIN, ED  I-STAT CG4 LACTIC ACID, ED    EKG  EKG Interpretation None       Radiology Ct Head Wo Contrast  Result Date: 05/30/2017 CLINICAL DATA:  72 year old male with hallucinations, shortness of breath, stridor. EXAM: CT HEAD WITHOUT CONTRAST TECHNIQUE: Contiguous axial images were obtained from the base of the skull through the vertex without intravenous contrast. COMPARISON:  No prior head CT. FINDINGS: Study is mildly degraded by motion artifact despite repeated imaging attempts. Brain: Cerebral volume is within normal limits for age. Incidental dural calcifications. No midline shift, ventriculomegaly, mass effect, evidence of mass lesion, intracranial hemorrhage or evidence of cortically based acute infarction. Gray-white matter differentiation is within normal limits throughout the brain. Vascular: Calcified atherosclerosis at the skull base. Skull: Negative.  Sinuses/Orbits: Clear. Other: Visualized orbits soft tissues are within normal limits. Possible broad-based left posterior convexity scalp hematoma measuring up to 7 millimeters in thickness (series 8, image 19). Underlying calvarium appears intact. Other scalp soft tissues appear negative. IMPRESSION: 1. Normal for age non contrast CT appearance of the brain allowing for some motion artifact. 2. Mild left posterior convexity scalp hematoma suspected. No underlying skull fracture. Electronically Signed   By: Genevie Ann M.D.   On: 05/30/2017 17:12   Dg Chest Port 1 View  Result Date: 05/30/2017 CLINICAL DATA:  Hypoxemia.  Ex smoker. EXAM: PORTABLE CHEST 1 VIEW COMPARISON:  None. FINDINGS: The heart is mildly enlarged. There is volume loss at the LEFT base, elevated hemidiaphragm, likely subsegmental atelectasis with small effusion. Early consolidation not excluded. RIGHT lung is clear. No visible bony abnormality.  IMPRESSION: LEFT base process of uncertain etiology. Elevated LEFT hemidiaphragm, with LEFT effusion. Subsegmental atelectasis with or without consolidation is possible. Cardiomegaly. Electronically Signed   By: Staci Righter M.D.   On: 05/30/2017 14:29    Procedures .Critical Care Performed by: Sharlett Iles, MD Authorized by: Sharlett Iles, MD   Critical care provider statement:    Critical care time (minutes):  45   Critical care time was exclusive of:  Separately billable procedures and treating other patients   Critical care was necessary to treat or prevent imminent or life-threatening deterioration of the following conditions:  Respiratory failure   Critical care was time spent personally by me on the following activities:  Development of treatment plan with patient or surrogate, evaluation of patient's response to treatment, examination of patient, obtaining history from patient or surrogate, ordering and performing treatments and interventions, ordering and review of  laboratory studies, ordering and review of radiographic studies, re-evaluation of patient's condition and review of old charts   (including critical care time)  Medications Ordered in ED Medications  Racepinephrine HCl 2.25 % nebulizer solution (  Not Given 05/30/17 1414)  albuterol (PROVENTIL,VENTOLIN) solution continuous neb (0 mg/hr Nebulization Stopped 05/30/17 1544)  ipratropium-albuterol (DUONEB) 0.5-2.5 (3) MG/3ML nebulizer solution 3 mL (3 mLs Nebulization Given 05/30/17 1330)  methylPREDNISolone sodium succinate (SOLU-MEDROL) 125 mg/2 mL injection 125 mg (125 mg Intravenous Given 05/30/17 1331)  Racepinephrine HCl 2.25 % nebulizer solution 0.5 mL (0.5 mLs Nebulization Given 05/30/17 1358)  magnesium sulfate IVPB 2 g 50 mL (0 g Intravenous Stopped 05/30/17 1558)  iopamidol (ISOVUE-370) 76 % injection (100 mLs Intravenous Contrast Given 05/30/17 1638)  haloperidol lactate (HALDOL) injection 2 mg (2 mg Intravenous Given 05/30/17 1631)     Initial Impression / Assessment and Plan / ED Course  I have reviewed the triage vital signs and the nursing notes.  Pertinent labs & imaging results that were available during my care of the patient were reviewed by me and considered in my medical decision making (see chart for details).     PT hypoxic to 84% on arrival. He had inspiratory stridor, no V is swelling in mouth.  No rash or other symptoms suggestive of anaphylaxis.  He does have history of COPD.  Gave Solu-Medrol with DuoNeb, no improvement initially.  Tried a racemic epinephrine breathing treatment which did not change symptoms.  On reassessment, he seemed to have some subtle wheezes thus placed on continuous albuterol. Pt does not appear to be bothered by his breathing problems. I have considered the possibility that his upper airway noises are volitional but given hypoxia, obtained CTA chest after CXR showed L pleural effusion w/ cardiomegaly.   V pH 7.33 CO2 67; normal CBC, AST 61, glucose  240, Cr 1.45, BUN 17, normal AG. Lactate and trop normal. Obtained soft tissue neck CT to evaluate for mass and head CT to r/o intracranial process.  I have signed the patient out to the oncoming provider, Dr. Laverta Baltimore.  Patient remains on supplemental oxygen and will need ENT evaluation, Dr. Constance Holster has been contacted and is reviewing scans. I anticipate admission. Final Clinical Impressions(s) / ED Diagnoses   Final diagnoses:  None    ED Discharge Orders    None       Damari Hiltz, Wenda Overland, MD 05/30/17 1719    Gwyndolyn Guilford, Wenda Overland, MD 05/30/17 1719

## 2017-05-30 NOTE — ED Triage Notes (Signed)
He is brought to [place] by [facility] With IVC papers indication pt. Is hallucinating and is paranoid; repeatedly stating that people "are coming out of my walls to get me", etc. He arrives here in no distress, somewhat short of breath with a ProAir (albuterol) inhaler, which he states is his only medication. When I ask why he is here, he tells me "I guess 'cause I'm scary-lookin'" [sic]. His initial SPO2 is 86%.

## 2017-05-30 NOTE — Progress Notes (Signed)
eLink Physician-Brief Progress Note Patient Name: Vincent Hansen DOB: [birth year] MRN: [id number]   Date of Service  05/30/2017  HPI/Events of Note  72 yo male with unknown PMH admitted to ICU s/p emergent trach for airway obstruction with #6 Shiley trach by ENT. VSS. Ground team notified of admission.   eICU Interventions  Will order: 1. Ventilator orders: 60%/PRVC 18/TV 620/P 5. 2. ABG at 7:30 PM. 3. Portable CXR STAT. 4. Propofol IV infusion. Titrate to RASS = 0 to -1.      Intervention Category Evaluation Type: New Patient Evaluation  Lysle Dingwall 05/30/2017, 7:25 PM

## 2017-05-30 NOTE — ED Notes (Signed)
Walked into pt's room to find pt had unhooked himself from monitor, IV, and breathing treatment. Pt redirected and placed back on monitor and IV restarted. MD informed .

## 2017-05-30 NOTE — ED Notes (Signed)
After breathing treatment, no change noted. Pt continues with srtidor

## 2017-05-30 NOTE — ED Notes (Signed)
pts belongings in cabinet for RES A while pt went to The OR!

## 2017-05-30 NOTE — ED Notes (Signed)
Bed: [facility] Expected date:  Expected time:  Means of arrival:  Comments: 

## 2017-05-30 NOTE — Op Note (Signed)
05/30/2017 6:28 PM   PATIENT:  Vincent Hansen, 72 y.o. male  PRE-OPERATIVE DIAGNOSIS:  AIRWAY DISTRESS  POST-OPERATIVE DIAGNOSIS:  AIRWAY DISTRESS AND LARYNGEAL MASS   PROCEDURE:  Procedure(s): Awake tracheostomy, emergency Direct laryngoscopy with biopsy  SURGEON:  Surgeon(s): Beckie Salts, MD  ASSISTANTS: none   ANESTHESIA:   Local, followed by general  EBL: 20 cc  DRAINS: none   LOCAL MEDICATIONS USED: 1% Xylocaine with epinephrine  COUNTS CORRECT:  YES  PROCEDURE DETAILS: Patient was taken to the operating room and placed on the operating table in the supine position.  Intravenous sedation was administered.  The neck was prepped and draped in a standard fashion. A vertical incision was created just above the sternal notch using electrocautery. The midline fascia was divided. The isthmus of the thyroid was reflected superiorly and the upper trachea was exposed. A tracheotomy was created between the first and second tracheal rings in a horizontal fashion.  A #6 Shiley tracheostomy tube was placed without difficulty and the cuff was inflated. The shield was secured to the neck using a Velcro straps and nylon suture.  Once the airway was established, general anesthesia was administered.  The table was turned 90 degrees.  A Jako laryngoscope was entered into the oral cavity and used to inspect the larynx.  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  The post cricoid mucosa was edematous but there is no obvious tumor.  The cords were not involved.  The tumor did not appear to cross the midline.  The subglottic larynx was completely clear.  Multiple biopsies were taken from the tumor and sent for pathologic evaluation.  The patient was then transferred to the intensive care unit in critical condition.  PLAN OF CARE: Transfer to ICU  PATIENT DISPOSITION:  ICU - hemodynamically stable.

## 2017-05-31 ENCOUNTER — Other Ambulatory Visit: Payer: Self-pay

## 2017-05-31 ENCOUNTER — Encounter (HOSPITAL_COMMUNITY): Payer: Self-pay

## 2017-05-31 DIAGNOSIS — J432 Centrilobular emphysema: Secondary | ICD-10-CM

## 2017-05-31 DIAGNOSIS — J9691 Respiratory failure, unspecified with hypoxia: Secondary | ICD-10-CM | POA: Diagnosis present

## 2017-05-31 DIAGNOSIS — J387 Other diseases of larynx: Secondary | ICD-10-CM

## 2017-05-31 LAB — CBC
HEMATOCRIT: 36.2 % — AB (ref 39.0–52.0)
Hemoglobin: 11.9 g/dL — ABNORMAL LOW (ref 13.0–17.0)
MCH: 27 pg (ref 26.0–34.0)
MCHC: 32.9 g/dL (ref 30.0–36.0)
MCV: 82.3 fL (ref 78.0–100.0)
PLATELETS: 242 10*3/uL (ref 150–400)
RBC: 4.4 MIL/uL (ref 4.22–5.81)
RDW: 13.3 % (ref 11.5–15.5)
WBC: 6.3 10*3/uL (ref 4.0–10.5)

## 2017-05-31 LAB — BASIC METABOLIC PANEL
Anion gap: 11 (ref 5–15)
BUN: 22 mg/dL — ABNORMAL HIGH (ref 6–20)
CHLORIDE: 101 mmol/L (ref 101–111)
CO2: 28 mmol/L (ref 22–32)
CREATININE: 1.48 mg/dL — AB (ref 0.61–1.24)
Calcium: 9.3 mg/dL (ref 8.9–10.3)
GFR calc non Af Amer: 46 mL/min — ABNORMAL LOW (ref >60)
GFR, EST AFRICAN AMERICAN: 53 mL/min — AB (ref >60)
Glucose, Bld: 142 mg/dL — ABNORMAL HIGH (ref 65–99)
POTASSIUM: 3.9 mmol/L (ref 3.5–5.1)
Sodium: 140 mmol/L (ref 135–145)

## 2017-05-31 LAB — GLUCOSE, CAPILLARY
GLUCOSE-CAPILLARY: 112 mg/dL — AB (ref 65–99)
GLUCOSE-CAPILLARY: 140 mg/dL — AB (ref 65–99)
GLUCOSE-CAPILLARY: 74 mg/dL (ref 65–99)
Glucose-Capillary: 116 mg/dL — ABNORMAL HIGH (ref 65–99)
Glucose-Capillary: 133 mg/dL — ABNORMAL HIGH (ref 65–99)

## 2017-05-31 LAB — BLOOD GAS, VENOUS
Acid-Base Excess: 5.9 mmol/L — ABNORMAL HIGH (ref 0.0–2.0)
Bicarbonate: 33.9 mmol/L — ABNORMAL HIGH (ref 20.0–28.0)
O2 SAT: 71.5 %
PCO2 VEN: 66.9 mmHg — AB (ref 44.0–60.0)
PO2 VEN: 43.2 mmHg (ref 32.0–45.0)
Patient temperature: 37
pH, Ven: 7.325 (ref 7.250–7.430)

## 2017-05-31 LAB — MAGNESIUM: Magnesium: 1.4 mg/dL — ABNORMAL LOW (ref 1.7–2.4)

## 2017-05-31 LAB — PHOSPHORUS: Phosphorus: 2.3 mg/dL — ABNORMAL LOW (ref 2.5–4.6)

## 2017-05-31 MED ORDER — FENTANYL CITRATE (PF) 100 MCG/2ML IJ SOLN
50.0000 ug | INTRAMUSCULAR | Status: DC | PRN
  Administered 2017-05-31 – 2017-06-02 (×6): 100 ug via INTRAVENOUS
  Administered 2017-06-03 – 2017-06-04 (×3): 50 ug via INTRAVENOUS
  Administered 2017-06-05 (×2): 100 ug via INTRAVENOUS
  Administered 2017-06-05 (×2): 50 ug via INTRAVENOUS
  Administered 2017-06-06 (×2): 100 ug via INTRAVENOUS
  Administered 2017-06-06: 50 ug via INTRAVENOUS
  Administered 2017-06-06 (×3): 100 ug via INTRAVENOUS
  Administered 2017-06-06: 50 ug via INTRAVENOUS
  Administered 2017-06-07: 100 ug via INTRAVENOUS
  Administered 2017-06-07: 50 ug via INTRAVENOUS
  Administered 2017-06-08 (×3): 100 ug via INTRAVENOUS
  Administered 2017-06-08: 50 ug via INTRAVENOUS
  Administered 2017-06-09 – 2017-06-11 (×11): 100 ug via INTRAVENOUS
  Filled 2017-05-31 (×38): qty 2

## 2017-05-31 MED ORDER — IPRATROPIUM-ALBUTEROL 0.5-2.5 (3) MG/3ML IN SOLN: 3.0000 mL | RESPIRATORY_TRACT | Status: DC | PRN

## 2017-05-31 NOTE — Progress Notes (Signed)
Name: Vincent Hansen MRN: [id number] DOB: Oct 31, 1945    ADMISSION DATE:  05/30/2017 CONSULTATION DATE:  05/30/2017  REFERRING MD :  Dr. Constance Holster   CHIEF COMPLAINT:  Laryngeal Mass   HISTORY OF PRESENT ILLNESS:   72 yo male former smoker brought to ER by police after being found hallucinating of people trying to harm him.  Noted to have stridor in ER.  CT neck showed supraglottic narrowing from circumferential mass.  Had emergent trach by ENT.  UDS positive for opiates.  PMHx of COPD, HTN, DM.  SUBJECTIVE:  Doesn't remember what happened yesterday.  Wants to eat.  VITAL SIGNS: BP 120/88   Pulse (!) 105   Temp 97.9 F (36.6 C) (Oral)   Resp 18   Ht 6' (1.829 m)   Wt 168 lb 10.4 oz (76.5 kg)   SpO2 99%   BMI 22.87 kg/m   INTAKE/OUPT: I/O last 3 completed shifts: In: 1361.7 [I.V.:1311.7; IV Piggyback:50] Out: 400 [Urine:400]  VENT SETTINGS: Vent Mode: PRVC FiO2 (%):  [40 %-60 %] 40 % Set Rate:  [18 bmp] 18 bmp Vt Set:  [620 mL] 620 mL PEEP:  [5 cmH20] 5 cmH20 Plateau Pressure:  [11 cmH20-14 cmH20] 11 cmH20  PHYSICAL EXAMINATION:  General - alert Eyes - pupils reactive ENT - trach site clean Cardiac - regular, tachycardic no murmur Chest - no wheeze, rales Abd - soft, non tender Ext - no edema Skin - no rashes Neuro - follows simple commands   LABS: CBC Recent Labs    05/30/17 1330 05/31/17 0404  WBC 7.8 6.3  HGB 14.3 11.9*  HCT 42.1 36.2*  PLT 271 242    Coag's No results for input(s): APTT, INR in the last 72 hours.  BMET Recent Labs    05/30/17 1330 05/31/17 0404  NA 140 140  K 3.9 3.9  CL 95* 101  CO2 34* 28  BUN 17 22*  CREATININE 1.45* 1.48*  GLUCOSE 240* 142*    Electrolytes Recent Labs    05/30/17 1330 05/31/17 0404  CALCIUM 10.2 9.3  MG  --  1.4*  PHOS  --  2.3*    Sepsis Markers No results for input(s): PROCALCITON, O2SATVEN in the last 72 hours.  Invalid input(s): LACTICACIDVEN  ABG Recent Labs    05/30/17 1946    PHART 7.342*  PCO2ART 54.1*  PO2ART 76.0*    Liver Enzymes Recent Labs    05/30/17 1330  AST 61*  ALT 43  ALKPHOS 86  BILITOT 0.6  ALBUMIN 4.3    Cardiac Enzymes No results for input(s): TROPONINI, PROBNP in the last 72 hours.  Glucose Recent Labs    05/30/17 1949 05/30/17 2316 05/31/17 0315 05/31/17 0736  GLUCAP 311* 248* 140* 74    Imaging Ct Head Wo Contrast  Result Date: 05/30/2017 CLINICAL DATA:  72 year old male with hallucinations, shortness of breath, stridor. EXAM: CT HEAD WITHOUT CONTRAST TECHNIQUE: Contiguous axial images were obtained from the base of the skull through the vertex without intravenous contrast. COMPARISON:  No prior head CT. FINDINGS: Study is mildly degraded by motion artifact despite repeated imaging attempts. Brain: Cerebral volume is within normal limits for age. Incidental dural calcifications. No midline shift, ventriculomegaly, mass effect, evidence of mass lesion, intracranial hemorrhage or evidence of cortically based acute infarction. Gray-white matter differentiation is within normal limits throughout the brain. Vascular: Calcified atherosclerosis at the skull base. Skull: Negative. Sinuses/Orbits: Clear. Other: Visualized orbits soft tissues are within normal limits. Possible broad-based left posterior  convexity scalp hematoma measuring up to 7 millimeters in thickness (series 8, image 19). Underlying calvarium appears intact. Other scalp soft tissues appear negative. IMPRESSION: 1. Normal for age non contrast CT appearance of the brain allowing for some motion artifact. 2. Mild left posterior convexity scalp hematoma suspected. No underlying skull fracture. Electronically Signed   By: Genevie Ann M.D.   On: 05/30/2017 17:12   Ct Soft Tissue Neck W Contrast  Addendum Date: 05/30/2017   ADDENDUM REPORT: 05/30/2017 17:44 ADDENDUM: Study discussed by telephone with Dr. Laverta Baltimore in the ED on 05/30/2017 at 1734 hours. Electronically Signed   By: Genevie Ann  M.D.   On: 05/30/2017 17:44   Result Date: 05/30/2017 CLINICAL DATA:  72 year old male with shortness of breath, stridor. EXAM: CT NECK WITH CONTRAST TECHNIQUE: Multidetector CT imaging of the neck was performed using the standard protocol following the bolus administration of intravenous contrast. CONTRAST:  100 milliliters ISOVUE-370 IOPAMIDOL (ISOVUE-370) INJECTION 76% in conjunction with contrast enhanced imaging of the chest reported separately. COMPARISON:  Chest CTA and noncontrast head CT today reported separately. FINDINGS: Study is moderately degraded by motion. Pharynx and larynx: There is pronounced motion artifact at the glottis, supraglottic larynx, hypopharynx and oropharynx. Subsequently, the supraglottic larynx was probably better demonstrated on the chest CTA today. Masslike circumferential intermediate density in the supraglottic larynx with an estimated 85% narrowing of the airway when considering cross-sectional area. As on the chest CT today this appears somewhat eccentric to the right. The laryngeal cartilages are not overtly destroyed. No definite para-laryngeal space invasion. There is only mild generalized thickening of the epiglottis. The hypopharynx and oropharynx are patent. The nasopharyngeal contour is within normal limits. Grossly negative parapharyngeal and retropharyngeal spaces. Salivary glands: Negative Lau in for motion artifact. Thyroid: Negative. Lymph nodes: No cervical lymphadenopathy is evident. Vascular: The bilateral carotid arteries and jugular veins appear patent to the skull base. The bilateral vertebral arteries are patent. There is calcified atherosclerosis in the neck and at the skull base. Limited intracranial: Negative. Visualized orbits: Negative. Mastoids and visualized paranasal sinuses: Mild sinus mucosal thickening. Skeleton: Widespread severe cervical spine degeneration. No acute osseous abnormality identified. Upper chest: Reported separately today.  IMPRESSION: 1. Moderate to severe narrowing of the supraglottic airway (numerically estimated at 85%) appears related to masslike circumferential soft tissue. The appearance is suspicious for a laryngeal neoplasm but supraglottic inflammation or other etiology remains possible. Motion artifact on this study is such that the finding may have been better demonstrated on the chest CTA today. 2. No lymphadenopathy is evident. And other neck soft tissues appear within normal limits when allowing for motion artifact. Electronically Signed: By: Genevie Ann M.D. On: 05/30/2017 17:32   Ct Angio Chest Pe W/cm &/or Wo Cm  Result Date: 05/30/2017 CLINICAL DATA:  72 year old male with shortness of breath, stridor. EXAM: CT ANGIOGRAPHY CHEST WITH CONTRAST TECHNIQUE: Multidetector CT imaging of the chest was performed using the standard protocol during bolus administration of intravenous contrast. Multiplanar CT image reconstructions and MIPs were obtained to evaluate the vascular anatomy. CONTRAST:  100 milliliters ISOVUE-370 IOPAMIDOL (ISOVUE-370) INJECTION 76% COMPARISON:  Portable chest x-ray 1327 hours today. FINDINGS: Cardiovascular: Suboptimal contrast bolus timing in the pulmonary arterial tree. Superimposed lower lung respiratory motion artifact. There is no central or hilar pulmonary artery filling defect. The upper lobe pulmonary arteries also appear patent. The lower lobe pulmonary arteries are not well evaluated. Borderline to mild cardiomegaly. No pericardial effusion. Mild Calcified aortic atherosclerosis. Calcified coronary artery  atherosclerosis is suspected. Mediastinum/Nodes: No mediastinal lymphadenopathy. Lungs/Pleura: The trachea is patent with globular retained secretions along the right lateral wall of the trachea and tracking into the right mainstem bronchus, but the right side airways remain patent. There is mild retained secretions in the left mainstem bronchus. Bilateral centrilobular emphysema. The  upper lobes are clear. Right middle lobe is clear aside from mild medial segment scarring or atelectasis. There is confluent bilateral dependent/peripheral lower lobe opacity which appears atypical for atelectasis. There is mild similar confluent opacity in the dependent lingula. No pleural effusion. Upper Abdomen: Negative visible liver, spleen, pancreas, and adrenal glands. The visible kidneys appear normal aside from upper pole simple appearing cyst. The visible stomach is distended. There is intermittent gas in the thoracic esophagus, but overall the esophagus remains within normal limits. Musculoskeletal: Advanced cervical spine degeneration. No acute osseous abnormality identified. Other findings: The subglottic larynx and vocal cord level appear within normal limits (series 2, image 21). There is masslike soft tissue thickening in the supraglottic larynx which is somewhat circumferential but more pronounced on the right (series 3, image 24). There is subsequent mild to moderate narrowing of the supraglottic airway. No lower cervical lymphadenopathy is evident. Review of the MIP images confirms the above findings. IMPRESSION: 1. Supraglottic airway narrowing related to mass-like soft tissue which is circumferential but greater on the right. No lower cervical or thoracic inlet lymphadenopathy is evident. Recommend ENT consultation and see also CT Neck reported separately. 2. No acute pulmonary embolus identified although the bilateral lower lobe pulmonary arteries are not well evaluated due to motion. 3. Small volume of retained or aspirated secretions in the trachea and both mainstem bronchi, but the major airways remain patent. 4. Confluent dependent bilateral lower lobe opacity which is beyond that typical of atelectasis. In light of #3, pulmonary aspiration is possible. No pleural effusion. 5. Underlying Emphysema (ICD10-J43.9). Electronically Signed   By: Genevie Ann M.D.   On: 05/30/2017 17:22   Dg Chest  Port 1 View  Result Date: 05/30/2017 CLINICAL DATA:  72 year old male with history of acute respiratory failure. Former smoker. EXAM: PORTABLE CHEST 1 VIEW COMPARISON:  Chest x-ray 05/30/2017. FINDINGS: Tracheostomy tube in position with tip terminating 7 cm above the carina. Lung volumes are low. Bibasilar linear opacities favored to reflect areas of subsegmental atelectasis and/or scarring. No acute consolidative airspace disease. No pleural effusions. No suspicious appearing pulmonary nodules or masses. Heart size is normal. The patient is rotated to the right on today's exam, resulting in distortion of the mediastinal contours and reduced diagnostic sensitivity and specificity for mediastinal pathology. IMPRESSION: 1. Low lung volumes with areas of scarring and/or subsegmental atelectasis in the lower lobes of the lungs bilaterally. Electronically Signed   By: Vinnie Langton M.D.   On: 05/30/2017 20:33   Dg Chest Port 1 View  Result Date: 05/30/2017 CLINICAL DATA:  Hypoxemia.  Ex smoker. EXAM: PORTABLE CHEST 1 VIEW COMPARISON:  None. FINDINGS: The heart is mildly enlarged. There is volume loss at the LEFT base, elevated hemidiaphragm, likely subsegmental atelectasis with small effusion. Early consolidation not excluded. RIGHT lung is clear. No visible bony abnormality. IMPRESSION: LEFT base process of uncertain etiology. Elevated LEFT hemidiaphragm, with LEFT effusion. Subsegmental atelectasis with or without consolidation is possible. Cardiomegaly. Electronically Signed   By: Staci Righter M.D.   On: 05/30/2017 14:29    STUDIES: CT head 3/17 >> Lt posterior scalp hematome CT neck 3/17 >> mass in supraglottic larynx with narrowing of airway  CT angio chest 3/17 >> emphysema, atelectasis  EVENTS: 3/17 Admit, To OR for tracheostomy and direct laryngoscopy with biopsy  LINES/TUBES: Lurline Idol 3/17 >>   DISCUSSION: Will transition off vent and try to maintain him off vent.  Will get speech to  assess.  F/u biopsy results.  Try to mobilize out of bed also.  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 - tracheostomy care per ENT - wean off vent and to trach collar - speech therapy to assess for PM valve and swallowing - f/u Bx results from 3/17  COPD with emphysema. - pulmicort, duoneb  Hallucinations with UDS positive for opiates. - could also be related to hypoxia - monitor mental status  DM type II. - SSI  DVT prophylaxis - SCDs SUP - protonix Nutrition - NPO Goals of care - full code  Chesley Mires, MD [place] 05/31/2017, 10:48 AM Pager:  [phone] After 3pm call: [phone]

## 2017-05-31 NOTE — Progress Notes (Signed)
Patient ID: Vincent Hansen, male   DOB: [birth year], 72 y.o.   MRN: [id number]  Patient seen on rounds today.  He is breathing a lot better.  He is awake and alert and able to communicate by writing.  He has a significant amount of thick discolored secretions.  I was able to obtain additional information today.   He has had trouble with his throat for a few months.    According to the patient, he has been evaluated at both [facility] and [facility] for this but I cannot find anything in the computer system to verify that.  We will await the results of the biopsy and discuss further plans following that.

## 2017-05-31 NOTE — Progress Notes (Signed)
Initial Nutrition Assessment  DOCUMENTATION CODES:   Non-severe (moderate) malnutrition in context of chronic illness  INTERVENTION:  - If pt unable to consume PO nutrition within the next 24-48 hours, recommend small bore NGT placement. - If TF needed, recommend Vital AF 1.2 @ 20 mL/hr to advance by 10 mL every 12 hours to reach goal rate of Vital AF 1.2 @ 60 mL/hr. At goal rate, this regimen will provide 1728 kcal, 108 grams of protein, and 1168 mL free water.  - Will monitor Propofol and will adjust TF recommendations if needed based on this.   Monitor magnesium, potassium, and phosphorus daily for at least 3 days, MD to replete as needed, as pt is at risk for refeeding syndrome given moderate malnutrition, unknown PO intakes PTA, positive UDS, current hypophosphatemia and hypomagnesemia.   NUTRITION DIAGNOSIS:   Moderate Malnutrition related to chronic illness(COPD, Type 2 DM) as evidenced by moderate muscle depletion, moderate fat depletion.  GOAL:   Patient will meet greater than or equal to 90% of their needs  MONITOR:   Vent status, Weight trends, Labs, I & O's  REASON FOR ASSESSMENT:   Ventilator, Low Braden, Malnutrition Screening Tool  ASSESSMENT:   72yo M w/ PMH including HTN, COPD and T2DM. Pt brough in by police who report that the patient called them to the home 3-4 times in the 24 hours PTA.  Each time they noted that he seemed to be responding to internal stimuli, talking to people who were not actually there and he appeared to be having hallucinations. On 4/09 the police contacted the crisis line, who did assessment and IVC'd patient for auditory and visual hallucinations, concern for safety.   BMI indicates normal weight. No family/visitors present at bedside. Lurline Idol was done yesterday at around 6:30 PM and pt is current on the vent via trach. No OGT/NGT in place. Unable to obtain relevant information from pt. Pt states several times that "there is a body inside of  me. You think I am crazy." No recent weight hx available in the chart.  Per Dr. Juanetta Gosling note this AM: CT neck showed supraglottic narrowing d/t a mass and he had emergent trach by ENT, biopsy taken and pending results, USD positive for opiates, plan to wean to trach collar and SLP to evaluate for [facility] and swallowing.  Patient is currently intubated on ventilator support via trach. MV: 10.6 L/min Temp (24hrs), Avg:97.8 F (36.6 C), Min:97.6 F (36.4 C), Max:98.2 F (36.8 C) Propofol: 9.2 ml/hr (243 kcal) BP: 120/69 and MAP: 85   Medications reviewed; sliding scale Novolog, 2 g IV Mg sulfate x1 run yesterday, 125 mg Solu-medrol x1 dose yesterday. Labs reviewed; CBG: 140 mg/dL this AM, BUN: 22 mg/dL, creatinine: 1.48 mg/dL, Phos: 2.3 mg/dL, Mg: 1.4 mg/dL, GFR: 53 mL/min.  Drip: Propofol @ 20 mcg/kg/min.     NUTRITION - FOCUSED PHYSICAL EXAM:    Most Recent Value  Orbital Region  Unable to assess  Upper Arm Region  Moderate depletion  Thoracic and Lumbar Region  Unable to assess  Buccal Region  Unable to assess  Temple Region  Mild depletion  Clavicle Bone Region  Mild depletion  Clavicle and Acromion Bone Region  Moderate depletion  Scapular Bone Region  Unable to assess  Dorsal Hand  Unable to assess  Patellar Region  Moderate depletion  Anterior Thigh Region  Moderate depletion  Posterior Calf Region  Moderate depletion  Edema (RD Assessment)  None  Hair  Reviewed  Eyes  Reviewed  Mouth  Reviewed  Skin  Reviewed  Nails  Unable to assess       Diet Order:  No diet orders on file  EDUCATION NEEDS:   No education needs have been identified at this time  Skin:  Skin Assessment: Reviewed RN Assessment  Last BM:  PTA/unknown  Height:   Ht Readings from Last 1 Encounters:  05/30/17 6' (1.829 m)    Weight:   Wt Readings from Last 1 Encounters:  05/30/17 168 lb 10.4 oz (76.5 kg)    Ideal Body Weight:  80.91 kg  BMI:  Body mass index is 22.87  kg/m.  Estimated Nutritional Needs:   Kcal:  1763  Protein:  92-115 grams (1.2-1.5 grams/kg)  Fluid:  >/= 1.8 L/day      Jarome Matin, MS, RD, LDN, [facility] Inpatient Clinical Dietitian Pager # [phone] After hours/weekend pager # [phone]

## 2017-05-31 NOTE — Progress Notes (Signed)
Placed Pt on ATC at 40% per doctors orders, SATS 98, HR 102 and BP 120/69. Pt has a lot of dark thick blood coming from his trach. The Pt has a strong cough and is able to clear his secretions. RT will continue to monitor.

## 2017-05-31 NOTE — Progress Notes (Signed)
Called emergency contact number and it stated the number is no longer in service. Also called the pts home number and received his answering machine. Informed day nurse of these events. Will also update charge nurse so someone can try to get a contact person.

## 2017-06-01 ENCOUNTER — Inpatient Hospital Stay (HOSPITAL_COMMUNITY): Payer: Non-veteran care

## 2017-06-01 ENCOUNTER — Encounter (HOSPITAL_COMMUNITY): Payer: Self-pay | Admitting: Otolaryngology

## 2017-06-01 DIAGNOSIS — E440 Moderate protein-calorie malnutrition: Secondary | ICD-10-CM | POA: Diagnosis present

## 2017-06-01 LAB — BASIC METABOLIC PANEL
Anion gap: 13 (ref 5–15)
BUN: 20 mg/dL (ref 6–20)
CHLORIDE: 100 mmol/L — AB (ref 101–111)
CO2: 30 mmol/L (ref 22–32)
Calcium: 9 mg/dL (ref 8.9–10.3)
Creatinine, Ser: 1.37 mg/dL — ABNORMAL HIGH (ref 0.61–1.24)
GFR calc Af Amer: 58 mL/min — ABNORMAL LOW (ref >60)
GFR calc non Af Amer: 50 mL/min — ABNORMAL LOW (ref >60)
Glucose, Bld: 143 mg/dL — ABNORMAL HIGH (ref 65–99)
POTASSIUM: 3.8 mmol/L (ref 3.5–5.1)
Sodium: 143 mmol/L (ref 135–145)

## 2017-06-01 LAB — GLUCOSE, CAPILLARY
Glucose-Capillary: 105 mg/dL — ABNORMAL HIGH (ref 65–99)
Glucose-Capillary: 112 mg/dL — ABNORMAL HIGH (ref 65–99)
Glucose-Capillary: 112 mg/dL — ABNORMAL HIGH (ref 65–99)
Glucose-Capillary: 122 mg/dL — ABNORMAL HIGH (ref 65–99)
Glucose-Capillary: 134 mg/dL — ABNORMAL HIGH (ref 65–99)
Glucose-Capillary: 143 mg/dL — ABNORMAL HIGH (ref 65–99)

## 2017-06-01 LAB — CBC
HEMATOCRIT: 39.9 % (ref 39.0–52.0)
Hemoglobin: 12.6 g/dL — ABNORMAL LOW (ref 13.0–17.0)
MCH: 26.8 pg (ref 26.0–34.0)
MCHC: 31.6 g/dL (ref 30.0–36.0)
MCV: 84.7 fL (ref 78.0–100.0)
Platelets: 228 10*3/uL (ref 150–400)
RBC: 4.71 MIL/uL (ref 4.22–5.81)
RDW: 13.8 % (ref 11.5–15.5)
WBC: 10.3 10*3/uL (ref 4.0–10.5)

## 2017-06-01 MED ORDER — VITAMINS A & D EX OINT
TOPICAL_OINTMENT | CUTANEOUS | Status: AC
  Administered 2017-06-01: 1
  Filled 2017-06-01: qty 5

## 2017-06-01 MED ORDER — HYDROGEN PEROXIDE 3 % EX SOLN
CUTANEOUS | Status: AC
  Filled 2017-06-01: qty 473

## 2017-06-01 MED ORDER — GLYCOPYRROLATE 0.2 MG/ML IJ SOLN
0.1000 mg | Freq: Once | INTRAMUSCULAR | Status: AC
  Administered 2017-06-01: 0.1 mg via INTRAVENOUS
  Filled 2017-06-01: qty 1

## 2017-06-01 NOTE — Addendum Note (Signed)
Addendum  created 06/01/17 0828 by Suzette Battiest, MD   Intraprocedure Event edited, Review and Sign - Ready for Procedure, Sign clinical note, SmartForm saved

## 2017-06-01 NOTE — Evaluation (Signed)
SLP Cancellation Note  Patient Details Name: Jamario Colina MRN: [id number] DOB: 10/20/1945   Cancelled treatment:       Reason Eval/Treat Not Completed: Other (comment)(pt now with excessive secretions, asked RN to page me when/if secretions better to allow evaluation)   Luanna Salk, [facility] SLP [phone]

## 2017-06-01 NOTE — Evaluation (Addendum)
Passy-Muir Speaking Valve - Evaluation Patient Details  Name: Vincent Hansen MRN: [id number] Date of Birth: 03-22-1945  Today's Date: 06/01/2017 Time: 4098-1191 SLP Time Calculation (min) (ACUTE ONLY): 24 min  Past Medical History:  Past Medical History:  Diagnosis Date  . Diabetes mellitus without complication ([place])   . Hypertension    Past Surgical History:  Past Surgical History:  Procedure Laterality Date  . HERNIA REPAIR    . 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  Pt is s/p emergent trach and order for swallow/PMSV evaluations received. CXR showed Low lung volumes with areas of scarring and/or subsegmental atelectasis in the lower lobes of the lungs bilaterally.     Pt denies h/o dysphagia and admits to dysphonia.    Assessment / Plan / Recommendation Clinical Impression  Pt presents with clinical indications concerning for pharyngeal dysphagia given his supraglottic mass s/p trach tube placement and copious secretions.  He was able to orally manipulate water - to swish and expectorate- which provides him comfort with maximal airway protection.  When given ice chips to swallow with PMSV in place, pt with immediate and delayed coughing with tracheal expectoration of viscous yellow/blood tinged secretions.  Pt will benefit from instrumental swallow evaluation prior to po administration due to multiple risk factors for silent nature of dysphagia.  Recommend pt be allowed to swish and expectorate water for comfort and proceed with MBS next am *with PMSV in place.  Pt agreeable to plan.  SLP Visit Diagnosis: Dysphagia, pharyngeal phase (R13.13)    SLP Assessment  Patient needs  continued Speech Lanaguage Pathology Services    Follow Up Recommendations  Home health SLP;Outpatient SLP    Frequency and Duration min 2x/week  2 weeks    PMSV Trial PMSV was placed for: phonation abilities Able to redirect subglottic air through upper airway: Yes Able to Attain Phonation: Yes Voice Quality: Hoarse;Low vocal intensity Able to Expectorate Secretions: Yes Level of Secretion Expectoration with PMSV: Tracheal Breath Support for Phonation: Mildly decreased Intelligibility: Intelligibility reduced(pt with rapid rate of imprecise speech, cues to slow rate effective to improve clarity) Word: 75-100% accurate Phrase: 75-100% accurate Sentence: 75-100% accurate Conversation: 50-74% accurate Respirations During Trial: 26(22-27) SpO2 During Trial: 95 %(94-98) Pulse During Trial: 478([phone]) Behavior: Alert;Cooperative;Expresses self well;Good eye contact   Tracheostomy Tube  Additional Tracheostomy Tube Assessment Level of Secretion Expectoration: Tracheal    Vent Dependency  Vent Dependent: No FiO2 (%): 40 %    Cuff Deflation Trial  GO Tolerated Cuff Deflation: Yes Length of Time for Cuff Deflation Trial: approximately 55 minutes Behavior: Alert;Expresses self well;Other (comment);Cooperative;Responsive to questions(rapid rate of speech)        [name] 06/01/2017, 4:40 PM Luanna Salk, [facility] SLP [phone]

## 2017-06-01 NOTE — Evaluation (Signed)
Clinical/Bedside Swallow Evaluation Patient Details  Name: Vincent Hansen MRN: [id number] Date of Birth: November 30, 1945  Today's Date: 06/01/2017 Time: SLP Start Time (ACUTE ONLY): 1536 SLP Stop Time (ACUTE ONLY): 1600 SLP Time Calculation (min) (ACUTE ONLY): 24 min  Past Medical History:  Past Medical History:  Diagnosis Date  . Diabetes mellitus without complication ([place])   . Hypertension    Past Surgical History:  Past Surgical History:  Procedure Laterality Date  . HERNIA REPAIR    . 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  Pt is s/p emergent trach and order for swallow/PMSV evaluations received. CXR showed Low lung volumes with areas of scarring and/or subsegmental atelectasis in the lower lobes of the lungs bilaterally.     Pt denies h/o dysphagia and admits to dysphonia.    Assessment / Plan / Recommendation Clinical Impression  Pt presents with clinical indications concerning for pharyngeal dysphagia given his supraglottic mass s/p trach tube placement and copious secretions.  He was able to orally manipulate water - to swish and expectorate- which provides him comfort with maximal airway protection.  When given ice chips to swallow with PMSV in place, pt with immediate and delayed coughing with tracheal expectoration of viscous yellow/blood tinged secretions.  Pt will benefit from instrumental swallow evaluation prior to po administration due to multiple risk factors for silent nature of dysphagia.  Recommend pt be allowed to swish and expectorate water for comfort and proceed with MBS next am *with PMSV in place.  Pt agreeable to plan.  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  Pt is s/p emergent trach and order for swallow/PMSV evaluations received. CXR showed Low lung volumes with areas of scarring and/or subsegmental atelectasis in the lower lobes of the lungs bilaterally.     Pt denies h/o dysphagia and admits to dysphonia.  Type of Study: Bedside Swallow Evaluation Previous Swallow Assessment: none Diet Prior to this Study: NPO Temperature Spikes Noted: (99.1) Respiratory Status: Trach;Trach Collar Trach Size and Type: Cuff;#6;With PMSV in place;Deflated History of Recent Intubation: No Behavior/Cognition: Alert;Cooperative;Pleasant mood Oral Cavity Assessment: Dry Oral Care Completed by SLP: Yes(assisted pt) Oral Cavity - Dentition: Dentures, top;Other (Comment)(lower dentition) Vision: Functional for self-feeding Self-Feeding Abilities: Able to feed self Patient Positioning: Upright in bed Baseline Vocal Quality: Hoarse;Low vocal intensity Volitional Cough: Strong Volitional Swallow: Able to elicit    Oral/Motor/Sensory Function Overall Oral Motor/Sensory Function: Within functional limits   Ice Chips Ice chips: Impaired Presentation: Spoon Pharyngeal Phase Impairments: Cough -  Immediate;Cough - Delayed Other Comments: both immediate and delayed coughing with tracheal expectoration of viscous yellow tinged  secretions mixed with blood  (SLP has to remove PMSV due to pt's respiratory issues)   Thin Liquid Thin Liquid: Not tested    Nectar Thick Nectar Thick Liquid: Not tested   Honey Thick Honey Thick Liquid: Not tested   Puree Puree: Not tested   Solid   GO   Solid: Not tested        [name] 06/01/2017,4:38 PM  Luanna Salk, [facility] SLP [phone]

## 2017-06-01 NOTE — Transfer of Care (Signed)
Immediate Anesthesia Transfer of Care Note  Patient: Vincent Hansen  Procedure(s) Performed: TRACHEOSTOMY, LARYNGEAL MASS BIOPSY (N/A Neck)  Patient Location: ICU  Anesthesia Type:MAC and General  Level of Consciousness: sedated  Airway & Oxygen Therapy: Patient placed on Ventilator (see vital sign flow sheet for setting)  Post-op Assessment: Report given to RN  Post vital signs: Reviewed  Last Vitals:  Vitals:   06/01/17 0600 06/01/17 0800  BP: 125/72   Pulse: 94   Resp: 19   Temp:  36.6 C  SpO2: 98%     Last Pain:  Vitals:   06/01/17 0800  TempSrc: Oral  PainSc:          Complications: No apparent anesthesia complications

## 2017-06-01 NOTE — Anesthesia Preprocedure Evaluation (Signed)
Anesthesia Evaluation  Patient identified by MRN, date of birth, ID band Patient confused    Reviewed: Unable to perform ROS - Chart review onlyPreop documentation limited or incomplete due to emergent nature of procedure.  Airway Mallampati: II  TM Distance: >3 FB     Dental  (+) Dental Advisory Given   Pulmonary former smoker,  Subglottic mass- airway distress    + decreased breath sounds      Cardiovascular hypertension,  Rhythm:Regular Rate:Tachycardia     Neuro/Psych    GI/Hepatic   Endo/Other  diabetes  Renal/GU      Musculoskeletal   Abdominal   Peds  Hematology   Anesthesia Other Findings   Reproductive/Obstetrics                             Anesthesia Physical Anesthesia Plan  ASA: V and emergent  Anesthesia Plan: MAC   Post-op Pain Management:    Induction: Intravenous  PONV Risk Score and Plan: 1 and Ondansetron and Treatment may vary due to age or medical condition  Airway Management Planned: Natural Airway and Mask  Additional Equipment:   Intra-op Plan:   Post-operative Plan: Post-operative intubation/ventilation  Informed Consent: I have reviewed the patients History and Physical, chart, labs and discussed the procedure including the risks, benefits and alternatives for the proposed anesthesia with the patient or authorized representative who has indicated his/her understanding and acceptance.   Dental advisory given  Plan Discussed with: CRNA  Anesthesia Plan Comments: (Pt brought emergently to OR for emergency trach. Pt in respiratory distress from obstructing airway mass. Plan for awake trach with Dr. Constance Holster.)        Anesthesia Quick Evaluation

## 2017-06-01 NOTE — Progress Notes (Signed)
Name: Vincent Hansen MRN: [id number] DOB: [birth year]    ADMISSION DATE:  05/30/2017 CONSULTATION DATE:  05/30/2017  REFERRING MD :  Dr. Constance Holster   CHIEF COMPLAINT:  Laryngeal Mass   HISTORY OF PRESENT ILLNESS:   72 yo male former smoker brought to ER by police after being found hallucinating of people trying to harm him.  Noted to have stridor in ER.  CT neck showed supraglottic narrowing from circumferential mass.  Had emergent trach by ENT.  UDS positive for opiates.  PMHx of COPD, HTN, DM.  SUBJECTIVE:  Off vent since yesterday morning.  Still has respiratory secretions.  VITAL SIGNS: BP 137/75   Pulse (!) 101   Temp 97.8 F (36.6 C) (Oral)   Resp (!) 27   Ht 6' (1.829 m)   Wt 168 lb 10.4 oz (76.5 kg)   SpO2 97%   BMI 22.87 kg/m   INTAKE/OUPT: I/O last 3 completed shifts: In: 317.2 [I.V.:207.2; Other:110] Out: 1000 [Urine:1000]  VENT SETTINGS: FiO2 (%):  [40 %] 40 %  PHYSICAL EXAMINATION:  General - pleasant Eyes - pupils reactive ENT - trach site with clear to yellow secretions Cardiac - regular, no murmur Chest - no wheeze, rales Abd - soft, non tender Ext - no edema Skin - no rashes Neuro - normal strength Psych - normal mood   LABS: CBC Recent Labs    05/30/17 1330 05/31/17 0404 06/01/17 0342  WBC 7.8 6.3 10.3  HGB 14.3 11.9* 12.6*  HCT 42.1 36.2* 39.9  PLT 271 242 228    Coag's No results for input(s): APTT, INR in the last 72 hours.  BMET Recent Labs    05/30/17 1330 05/31/17 0404 06/01/17 0342  NA 140 140 143  K 3.9 3.9 3.8  CL 95* 101 100*  CO2 34* 28 30  BUN 17 22* 20  CREATININE 1.45* 1.48* 1.37*  GLUCOSE 240* 142* 143*    Electrolytes Recent Labs    05/30/17 1330 05/31/17 0404 06/01/17 0342  CALCIUM 10.2 9.3 9.0  MG  --  1.4*  --   PHOS  --  2.3*  --     Sepsis Markers No results for input(s): PROCALCITON, O2SATVEN in the last 72 hours.  Invalid input(s): LACTICACIDVEN  ABG Recent Labs    05/30/17 1946    PHART 7.342*  PCO2ART 54.1*  PO2ART 76.0*    Liver Enzymes Recent Labs    05/30/17 1330  AST 61*  ALT 43  ALKPHOS 86  BILITOT 0.6  ALBUMIN 4.3    Cardiac Enzymes No results for input(s): TROPONINI, PROBNP in the last 72 hours.  Glucose Recent Labs    05/31/17 0736 05/31/17 1258 05/31/17 1743 05/31/17 2331 06/01/17 0319 06/01/17 0808  GLUCAP 74 116* 133* 112* 143* 112*    Imaging Ct Head Wo Contrast  Result Date: 05/30/2017 CLINICAL DATA:  72 year old male with hallucinations, shortness of breath, stridor. EXAM: CT HEAD WITHOUT CONTRAST TECHNIQUE: Contiguous axial images were obtained from the base of the skull through the vertex without intravenous contrast. COMPARISON:  No prior head CT. FINDINGS: Study is mildly degraded by motion artifact despite repeated imaging attempts. Brain: Cerebral volume is within normal limits for age. Incidental dural calcifications. No midline shift, ventriculomegaly, mass effect, evidence of mass lesion, intracranial hemorrhage or evidence of cortically based acute infarction. Gray-white matter differentiation is within normal limits throughout the brain. Vascular: Calcified atherosclerosis at the skull base. Skull: Negative. Sinuses/Orbits: Clear. Other: Visualized orbits soft tissues are within normal  limits. Possible broad-based left posterior convexity scalp hematoma measuring up to 7 millimeters in thickness (series 8, image 19). Underlying calvarium appears intact. Other scalp soft tissues appear negative. IMPRESSION: 1. Normal for age non contrast CT appearance of the brain allowing for some motion artifact. 2. Mild left posterior convexity scalp hematoma suspected. No underlying skull fracture. Electronically Signed   By: Genevie Ann M.D.   On: 05/30/2017 17:12   Ct Soft Tissue Neck W Contrast  Addendum Date: 05/30/2017   ADDENDUM REPORT: 05/30/2017 17:44 ADDENDUM: Study discussed by telephone with Dr. Laverta Baltimore in the ED on 05/30/2017 at 1734  hours. Electronically Signed   By: Genevie Ann M.D.   On: 05/30/2017 17:44   Result Date: 05/30/2017 CLINICAL DATA:  72 year old male with shortness of breath, stridor. EXAM: CT NECK WITH CONTRAST TECHNIQUE: Multidetector CT imaging of the neck was performed using the standard protocol following the bolus administration of intravenous contrast. CONTRAST:  100 milliliters ISOVUE-370 IOPAMIDOL (ISOVUE-370) INJECTION 76% in conjunction with contrast enhanced imaging of the chest reported separately. COMPARISON:  Chest CTA and noncontrast head CT today reported separately. FINDINGS: Study is moderately degraded by motion. Pharynx and larynx: There is pronounced motion artifact at the glottis, supraglottic larynx, hypopharynx and oropharynx. Subsequently, the supraglottic larynx was probably better demonstrated on the chest CTA today. Masslike circumferential intermediate density in the supraglottic larynx with an estimated 85% narrowing of the airway when considering cross-sectional area. As on the chest CT today this appears somewhat eccentric to the right. The laryngeal cartilages are not overtly destroyed. No definite para-laryngeal space invasion. There is only mild generalized thickening of the epiglottis. The hypopharynx and oropharynx are patent. The nasopharyngeal contour is within normal limits. Grossly negative parapharyngeal and retropharyngeal spaces. Salivary glands: Negative Lau in for motion artifact. Thyroid: Negative. Lymph nodes: No cervical lymphadenopathy is evident. Vascular: The bilateral carotid arteries and jugular veins appear patent to the skull base. The bilateral vertebral arteries are patent. There is calcified atherosclerosis in the neck and at the skull base. Limited intracranial: Negative. Visualized orbits: Negative. Mastoids and visualized paranasal sinuses: Mild sinus mucosal thickening. Skeleton: Widespread severe cervical spine degeneration. No acute osseous abnormality identified.  Upper chest: Reported separately today. IMPRESSION: 1. Moderate to severe narrowing of the supraglottic airway (numerically estimated at 85%) appears related to masslike circumferential soft tissue. The appearance is suspicious for a laryngeal neoplasm but supraglottic inflammation or other etiology remains possible. Motion artifact on this study is such that the finding may have been better demonstrated on the chest CTA today. 2. No lymphadenopathy is evident. And other neck soft tissues appear within normal limits when allowing for motion artifact. Electronically Signed: By: Genevie Ann M.D. On: 05/30/2017 17:32   Ct Angio Chest Pe W/cm &/or Wo Cm  Result Date: 05/30/2017 CLINICAL DATA:  72 year old male with shortness of breath, stridor. EXAM: CT ANGIOGRAPHY CHEST WITH CONTRAST TECHNIQUE: Multidetector CT imaging of the chest was performed using the standard protocol during bolus administration of intravenous contrast. Multiplanar CT image reconstructions and MIPs were obtained to evaluate the vascular anatomy. CONTRAST:  100 milliliters ISOVUE-370 IOPAMIDOL (ISOVUE-370) INJECTION 76% COMPARISON:  Portable chest x-ray 1327 hours today. FINDINGS: Cardiovascular: Suboptimal contrast bolus timing in the pulmonary arterial tree. Superimposed lower lung respiratory motion artifact. There is no central or hilar pulmonary artery filling defect. The upper lobe pulmonary arteries also appear patent. The lower lobe pulmonary arteries are not well evaluated. Borderline to mild cardiomegaly. No pericardial effusion. Mild Calcified  aortic atherosclerosis. Calcified coronary artery atherosclerosis is suspected. Mediastinum/Nodes: No mediastinal lymphadenopathy. Lungs/Pleura: The trachea is patent with globular retained secretions along the right lateral wall of the trachea and tracking into the right mainstem bronchus, but the right side airways remain patent. There is mild retained secretions in the left mainstem bronchus.  Bilateral centrilobular emphysema. The upper lobes are clear. Right middle lobe is clear aside from mild medial segment scarring or atelectasis. There is confluent bilateral dependent/peripheral lower lobe opacity which appears atypical for atelectasis. There is mild similar confluent opacity in the dependent lingula. No pleural effusion. Upper Abdomen: Negative visible liver, spleen, pancreas, and adrenal glands. The visible kidneys appear normal aside from upper pole simple appearing cyst. The visible stomach is distended. There is intermittent gas in the thoracic esophagus, but overall the esophagus remains within normal limits. Musculoskeletal: Advanced cervical spine degeneration. No acute osseous abnormality identified. Other findings: The subglottic larynx and vocal cord level appear within normal limits (series 2, image 21). There is masslike soft tissue thickening in the supraglottic larynx which is somewhat circumferential but more pronounced on the right (series 3, image 24). There is subsequent mild to moderate narrowing of the supraglottic airway. No lower cervical lymphadenopathy is evident. Review of the MIP images confirms the above findings. IMPRESSION: 1. Supraglottic airway narrowing related to mass-like soft tissue which is circumferential but greater on the right. No lower cervical or thoracic inlet lymphadenopathy is evident. Recommend ENT consultation and see also CT Neck reported separately. 2. No acute pulmonary embolus identified although the bilateral lower lobe pulmonary arteries are not well evaluated due to motion. 3. Small volume of retained or aspirated secretions in the trachea and both mainstem bronchi, but the major airways remain patent. 4. Confluent dependent bilateral lower lobe opacity which is beyond that typical of atelectasis. In light of #3, pulmonary aspiration is possible. No pleural effusion. 5. Underlying Emphysema (ICD10-J43.9). Electronically Signed   By: Genevie Ann M.D.    On: 05/30/2017 17:22   Dg Chest Port 1 View  Result Date: 06/01/2017 CLINICAL DATA:  Respiratory failure. EXAM: PORTABLE CHEST 1 VIEW COMPARISON:  05/30/2017. FINDINGS: Tracheostomy tube in stable position. Heart size stable. Improved aeration right lung base. Persistent mild atelectasis/pleuroparenchymal scarring left lung base. Small left pleural effusion no pneumothorax. IMPRESSION: 1.  Tracheostomy tube in stable position. 2. Improved aeration right lung base. Persistent mild atelectasis/pleuroparenchymal scarring left lung base. Electronically Signed   By: Marcello Moores  Register   On: 06/01/2017 07:07   Dg Chest Port 1 View  Result Date: 05/30/2017 CLINICAL DATA:  72 year old male with history of acute respiratory failure. Former smoker. EXAM: PORTABLE CHEST 1 VIEW COMPARISON:  Chest x-ray 05/30/2017. FINDINGS: Tracheostomy tube in position with tip terminating 7 cm above the carina. Lung volumes are low. Bibasilar linear opacities favored to reflect areas of subsegmental atelectasis and/or scarring. No acute consolidative airspace disease. No pleural effusions. No suspicious appearing pulmonary nodules or masses. Heart size is normal. The patient is rotated to the right on today's exam, resulting in distortion of the mediastinal contours and reduced diagnostic sensitivity and specificity for mediastinal pathology. IMPRESSION: 1. Low lung volumes with areas of scarring and/or subsegmental atelectasis in the lower lobes of the lungs bilaterally. Electronically Signed   By: Vinnie Langton M.D.   On: 05/30/2017 20:33   Dg Chest Port 1 View  Result Date: 05/30/2017 CLINICAL DATA:  Hypoxemia.  Ex smoker. EXAM: PORTABLE CHEST 1 VIEW COMPARISON:  None. FINDINGS: The heart is  mildly enlarged. There is volume loss at the LEFT base, elevated hemidiaphragm, likely subsegmental atelectasis with small effusion. Early consolidation not excluded. RIGHT lung is clear. No visible bony abnormality. IMPRESSION: LEFT  base process of uncertain etiology. Elevated LEFT hemidiaphragm, with LEFT effusion. Subsegmental atelectasis with or without consolidation is possible. Cardiomegaly. Electronically Signed   By: Staci Righter M.D.   On: 05/30/2017 14:29    STUDIES: CT head 3/17 >> Lt posterior scalp hematome CT neck 3/17 >> mass in supraglottic larynx with narrowing of airway CT angio chest 3/17 >> emphysema, atelectasis  EVENTS: 3/17 Admit, To OR for tracheostomy and direct laryngoscopy with biopsy 3/18 Off vent 3/19 Change to SDU status  LINES/TUBES: Trach 3/17 >>   DISCUSSION: Respiratory secretions barrier to PM valve.  Awaiting bx results.  Has maintained himself off vent.  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 - tracheostomy care per ENT - trach collar - f/u with speech therapy - add robinul for respiratory secretions - f/u Bx results from 3/17  COPD with emphysema. - pulmicort, brovana, and prn duoneb  Hallucinations from hypoxia and opiate use. - much improved - monitor mental status  DM type II. - SSI  DVT prophylaxis - SCDs SUP - protonix Nutrition - NPO Goals of care - full code  PCCM will sign off.  Can consult hospitalist if additional medical management needed while he is in hospital.  Chesley Mires, MD [place] 06/01/2017, 9:23 AM Pager:  [phone] After 3pm call: [phone]

## 2017-06-01 NOTE — Anesthesia Postprocedure Evaluation (Signed)
Anesthesia Post Note  Patient: Vincent Hansen  Procedure(s) Performed: TRACHEOSTOMY, LARYNGEAL MASS BIOPSY (N/A Neck)     Patient location during evaluation: PACU Anesthesia Type: General Level of consciousness: awake and alert Pain management: pain level controlled Vital Signs Assessment: post-procedure vital signs reviewed and stable Respiratory status: spontaneous breathing, nonlabored ventilation, respiratory function stable and patient connected to nasal cannula oxygen Cardiovascular status: blood pressure returned to baseline and stable Postop Assessment: no apparent nausea or vomiting Anesthetic complications: no    Last Vitals:  Vitals:   06/01/17 0600 06/01/17 0800  BP: 125/72   Pulse: 94   Resp: 19   Temp:  36.6 C  SpO2: 98%     Last Pain:  Vitals:   06/01/17 0800  TempSrc: Oral  PainSc:                  Tiajuana Amass

## 2017-06-02 ENCOUNTER — Ambulatory Visit
Admit: 2017-06-02 | Discharge: 2017-06-02 | Disposition: A | Discharge status: Home / Self Care | Payer: Self-pay | Attending: Radiation Oncology | Admitting: Radiation Oncology

## 2017-06-02 ENCOUNTER — Inpatient Hospital Stay (HOSPITAL_COMMUNITY): Payer: Non-veteran care

## 2017-06-02 LAB — GLUCOSE, CAPILLARY
GLUCOSE-CAPILLARY: 104 mg/dL — AB (ref 65–99)
GLUCOSE-CAPILLARY: 124 mg/dL — AB (ref 65–99)
GLUCOSE-CAPILLARY: 138 mg/dL — AB (ref 65–99)
Glucose-Capillary: 173 mg/dL — ABNORMAL HIGH (ref 65–99)
Glucose-Capillary: 113 mg/dL — ABNORMAL HIGH (ref 65–99)
Glucose-Capillary: 117 mg/dL — ABNORMAL HIGH (ref 65–99)

## 2017-06-02 MED ORDER — LIP MEDEX EX OINT
TOPICAL_OINTMENT | CUTANEOUS | Status: AC
  Administered 2017-06-02: 1
  Filled 2017-06-02: qty 7

## 2017-06-02 NOTE — Progress Notes (Addendum)
Nutrition Follow-up  DOCUMENTATION CODES:   Non-severe (moderate) malnutrition in context of chronic illness  INTERVENTION:  - Will monitor for results of MBS. - Will provide appropriate interventions at follow-up.   *Based on addendum: recommend PEG placement with 30 mL Prostat once/day Jevity 1.5 @ 15 mL/hr to advance by 10 mL every 12 hours to reach goal rate of Jevity 1.5 @ 55 mL/hr. At goal rate, this regimen will provide 2080 kcal, 99 grams of protein, and 1008 mL free water.  *Noted hx of DM, current CBGs, and need for sliding scale Novolog. Will need to trial standard TF formula and monitor CBGs on this before being able to order specialty formula (such as Glucerna) for home use for insurance purposes.   NUTRITION DIAGNOSIS:   Moderate Malnutrition related to chronic illness(COPD, Type 2 DM) as evidenced by moderate muscle depletion, moderate fat depletion. -ongoing  GOAL:   Patient will meet greater than or equal to 90% of their needs -unmet at this time.   MONITOR:   Diet advancement, Weight trends, Labs, Skin  ASSESSMENT:   72yo M w/ PMH including HTN, COPD and T2DM. Pt brough in by police who report that the patient called them to the home 3-4 times in the 24 hours PTA.  Each time they noted that he seemed to be responding to internal stimuli, talking to people who were not actually there and he appeared to be having hallucinations. On 8/85 the police contacted the crisis line, who did assessment and IVC'd patient for auditory and visual hallucinations, concern for safety.   Weight trending down (-9 lbs/4.3 kg) since admission on 3/17. Estimated nutrition needs updated based on current weight. Pt with trach, no NGT/OGT during admission. Transitioned from vent via trach to trach collar yesterday.  SLP saw pt yesterday. Note states "concern for pharyngeal dysphagia d/t supraglottic mass s/p trach and copious secretions." Plan for MBS this AM with PMSV in place. Pt currently  out of the room in diagnostic radiology for this test.  Medications reviewed; sliding scale Novolog, 40 mg Protonix/day. Labs reviewed; CBGs: 173 and 113 mg/dL this AM, Cl: 100 mmol/L, creatinine: 1.37 mg/dL, GFR: 58 mL/min.     ADDENDUM: Spoke with SLP on the phone a few minutes ago. She alerted RD to the fact that pt is grossly aspirating and will require TF (recommendation for PEG). Outlined TF recommendations above. Will continue to follow per protocol.     Diet Order:  No diet orders on file  EDUCATION NEEDS:   No education needs have been identified at this time  Skin:  Skin Assessment: Reviewed RN Assessment  Last BM:  PTA/unknown  Height:   Ht Readings from Last 1 Encounters:  05/30/17 6' (1.829 m)    Weight:   Wt Readings from Last 1 Encounters:  06/02/17 159 lb 2.8 oz (72.2 kg)    Ideal Body Weight:  80.91 kg  BMI:  Body mass index is 21.59 kg/m.  Estimated Nutritional Needs:   Kcal:  1805-2025 (25-28 kcal/kg)  Protein:  87-101 grams (1.2-1.4 grams/kg)  Fluid:  >/= 1.8 L/day      Jarome Matin, MS, RD, LDN, [facility] Inpatient Clinical Dietitian Pager # [phone] After hours/weekend pager # [phone]

## 2017-06-02 NOTE — Consult Note (Signed)
Radiation Oncology         [phone] ________________________________  Name: Vincent Hansen        MRN: [id number]  Date of Service: 06/02/17              DOB: [birth year]  [id number], No Pcp Per  No ref. provider found     REFERRING PHYSICIAN: Dr. Constance Holster  DIAGNOSIS: The primary encounter diagnosis was Acute respiratory failure with hypoxia ([place]). Diagnoses of Hallucinations, Acute respiratory failure ([place]), and Respiratory failure ([place]) were also pertinent to this visit.   HISTORY OF PRESENT ILLNESS: Vincent Hansen is a 72 y.o. male seen at the request of Dr. Constance Holster for a newly diagnosed laryngeal cancer. The patient has a difficult history due to probable psychosis at the time of presenting to the ED after being taken by police in for evaluation. Apparently he was having hallucinations and when brought to the ED was found to be in respiratory distress with stridor. Additional imaging revealed a concern for a mass in the supraglottic larynx. His case has been difficult to fully outline due to the need to proceed to emergent tracheostomy. In reviewing the records and in discussing his case with Dr. 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 He was found to have invasive disease in 2012 and received radiotherapy at the [facility]. He has been followed by ENT since and was recently found to have in situ disease again and was being set up for a laser ablation procedure that he missed an appointment for this week. While undergoing emergent tracheostomy, Dr. Constance Holster noted a large friable mass in the supraglottic region and a biopsy was obtained, this returned invasive squamous cell carcinoma. His case was discussed in Head and Neck Oncology Conference this morning. He needs additional work up with a repeat CT neck due to motion artifact when this was performed on 05/30/17, as well as an outpatient PET scan. We're asked to see him to discuss options  of possible radiotherapy treatment.     PREVIOUS RADIATION THERAPY: Yes   Details unknown, but call is in to [facility].   PAST MEDICAL HISTORY:  Past Medical History:  Diagnosis Date  . Diabetes mellitus without complication ([place])   . Hypertension        PAST SURGICAL HISTORY: Past Surgical History:  Procedure Laterality Date  . HERNIA REPAIR    . TRACHEOSTOMY TUBE PLACEMENT N/A 05/30/2017   Procedure: TRACHEOSTOMY, LARYNGEAL MASS BIOPSY;  Surgeon: [name], MD;  Location: [facility];  Service: ENT;  Laterality: N/A;      FAMILY HISTORY:  Family History  Problem Relation Age of Onset  . Cancer Neg Hx      SOCIAL HISTORY:  reports that he has quit smoking. He has never used smokeless tobacco. He reports that he does not drink alcohol. The patient is single. He lives in [place], and reports he does not have any family members who he's close with that he would consider sharing his medical information with. He denies any friendships or relationships he'd share information with. 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 He also reports he's from [place], [place], and enjoys making moonshine.    ALLERGIES: Patient has  no known allergies.   MEDICATIONS:  Current Facility-Administered Medications  Medication Dose Route Frequency Provider Last Rate Last Dose  . arformoterol (BROVANA) nebulizer solution 15 mcg  15 mcg Nebulization BID Omar Person, NP   15 mcg at 06/02/17 2002  . budesonide (PULMICORT) nebulizer solution 0.5 mg  0.5 mg Nebulization BID Hayden Pedro M, NP   0.5 mg at 06/02/17 2002  . chlorhexidine gluconate (MEDLINE KIT) (PERIDEX) 0.12 % solution 15 mL  15 mL Mouth Rinse BID Anders Simmonds, MD   15 mL at 06/02/17 2110  . fentaNYL  (SUBLIMAZE) injection 50-100 mcg  50-100 mcg Intravenous Q2H PRN Chesley Mires, MD   100 mcg at 06/02/17 1817  . insulin aspart (novoLOG) injection 0-15 Units  0-15 Units Subcutaneous Q4H Omar Person, NP   2 Units at 06/03/17 0030  . ipratropium-albuterol (DUONEB) 0.5-2.5 (3) MG/3ML nebulizer solution 3 mL  3 mL Nebulization Q4H PRN Chesley Mires, MD      . MEDLINE mouth rinse  15 mL Mouth Rinse QID Anders Simmonds, MD   15 mL at 06/02/17 1700  . pantoprazole (PROTONIX) injection 40 mg  40 mg Intravenous Q24H Hayden Pedro M, NP   40 mg at 06/02/17 2230     REVIEW OF SYSTEMS: On review of systems, the patient is unable to audibly speak. He tries but his secretions limit his ability to speak. He writes all of his responses. We spent greater than 40 minutes trying to coordinate his care by calling the [facility] on his cell phone. He was irritated with me when we didn't get his PCP on the line at that moment. I assured him I would communicate with her. No other complaints were noted at the time.   PHYSICAL EXAM:  Wt Readings from Last 3 Encounters:  06/02/17 159 lb 2.8 oz (72.2 kg)  11/28/14 202 lb (91.6 kg)  11/23/14 202 lb (91.6 kg)   Temp Readings from Last 3 Encounters:  06/03/17 97.6 F (36.4 C) (Oral)  11/28/14 98.1 F (36.7 C) (Oral)  11/23/14 97.3 F (36.3 C) (Oral)   BP Readings from Last 3 Encounters:  06/03/17 (!) 148/91  11/28/14 110/78  11/23/14 149/92   Pulse Readings from Last 3 Encounters:  06/03/17 (!) 41  11/28/14 89  11/23/14 71   Pain Assessment Pain Score: 0-No pain/10  In general this is a stable but still ill appearing African American male in no acute distress. He is alert and oriented x4 and appropriate throughout the examination. HEENT reveals that the patient is normocephalic, atraumatic. EOMs are intact. PERRLA. Skin is intact without any evidence of gross lesions. His tracheostomy site is intact and with coughing he brings up thick rope like phlegm.  Cardiopulmonary assessment is negative for acute distress and he exhibits normal effort.     ECOG = 4  0 - Asymptomatic (Fully active, able to carry on all predisease activities without restriction)  1 - Symptomatic but completely ambulatory (Restricted in physically strenuous activity but ambulatory and able to carry out work of a light or sedentary nature. For example, light housework, office work)  2 - Symptomatic, <50% in bed during the day (Ambulatory and capable of all self care but unable to carry out any work activities. Up and about more than 50% of waking hours)  3 - Symptomatic, >50% in bed, but not bedbound (Capable of only limited self-care, confined to bed or chair 50% or more of waking hours)  4 - Bedbound (  Completely disabled. Cannot carry on any self-care. Totally confined to bed or chair)  5 - Death   Eustace Pen MM, Creech RH, Tormey DC, et al. [phone]). "Toxicity and response criteria of the [facility]". [place] Oncol. 5 (6): 649-55    LABORATORY DATA:  Lab Results  Component Value Date   WBC 10.3 06/01/2017   HGB 12.6 (L) 06/01/2017   HCT 39.9 06/01/2017   MCV 84.7 06/01/2017   PLT 228 06/01/2017   Lab Results  Component Value Date   NA 143 06/01/2017   K 3.8 06/01/2017   CL 100 (L) 06/01/2017   CO2 30 06/01/2017   Lab Results  Component Value Date   ALT 43 05/30/2017   AST 61 (H) 05/30/2017   ALKPHOS 86 05/30/2017   BILITOT 0.6 05/30/2017      RADIOGRAPHY: Ct Head Wo Contrast  Result Date: 05/30/2017 CLINICAL DATA:  72 year old male with hallucinations, shortness of breath, stridor. EXAM: CT HEAD WITHOUT CONTRAST TECHNIQUE: Contiguous axial images were obtained from the base of the skull through the vertex without intravenous contrast. COMPARISON:  No prior head CT. FINDINGS: Study is mildly degraded by motion artifact despite repeated imaging attempts. Brain: Cerebral volume is within normal limits for age. Incidental dural  calcifications. No midline shift, ventriculomegaly, mass effect, evidence of mass lesion, intracranial hemorrhage or evidence of cortically based acute infarction. Gray-white matter differentiation is within normal limits throughout the brain. Vascular: Calcified atherosclerosis at the skull base. Skull: Negative. Sinuses/Orbits: Clear. Other: Visualized orbits soft tissues are within normal limits. Possible broad-based left posterior convexity scalp hematoma measuring up to 7 millimeters in thickness (series 8, image 19). Underlying calvarium appears intact. Other scalp soft tissues appear negative. IMPRESSION: 1. Normal for age non contrast CT appearance of the brain allowing for some motion artifact. 2. Mild left posterior convexity scalp hematoma suspected. No underlying skull fracture. Electronically Signed   By: Genevie Ann M.D.   On: 05/30/2017 17:12   Ct Soft Tissue Neck W Contrast  Addendum Date: 05/30/2017   ADDENDUM REPORT: 05/30/2017 17:44 ADDENDUM: Study discussed by telephone with Dr. Laverta Baltimore in the ED on 05/30/2017 at 1734 hours. Electronically Signed   By: Genevie Ann M.D.   On: 05/30/2017 17:44   Result Date: 05/30/2017 CLINICAL DATA:  72 year old male with shortness of breath, stridor. EXAM: CT NECK WITH CONTRAST TECHNIQUE: Multidetector CT imaging of the neck was performed using the standard protocol following the bolus administration of intravenous contrast. CONTRAST:  100 milliliters ISOVUE-370 IOPAMIDOL (ISOVUE-370) INJECTION 76% in conjunction with contrast enhanced imaging of the chest reported separately. COMPARISON:  Chest CTA and noncontrast head CT today reported separately. FINDINGS: Study is moderately degraded by motion. Pharynx and larynx: There is pronounced motion artifact at the glottis, supraglottic larynx, hypopharynx and oropharynx. Subsequently, the supraglottic larynx was probably better demonstrated on the chest CTA today. Masslike circumferential intermediate density in the  supraglottic larynx with an estimated 85% narrowing of the airway when considering cross-sectional area. As on the chest CT today this appears somewhat eccentric to the right. The laryngeal cartilages are not overtly destroyed. No definite para-laryngeal space invasion. There is only mild generalized thickening of the epiglottis. The hypopharynx and oropharynx are patent. The nasopharyngeal contour is within normal limits. Grossly negative parapharyngeal and retropharyngeal spaces. Salivary glands: Negative Lau in for motion artifact. Thyroid: Negative. Lymph nodes: No cervical lymphadenopathy is evident. Vascular: The bilateral carotid arteries and jugular veins appear patent to the skull base.  The bilateral vertebral arteries are patent. There is calcified atherosclerosis in the neck and at the skull base. Limited intracranial: Negative. Visualized orbits: Negative. Mastoids and visualized paranasal sinuses: Mild sinus mucosal thickening. Skeleton: Widespread severe cervical spine degeneration. No acute osseous abnormality identified. Upper chest: Reported separately today. IMPRESSION: 1. Moderate to severe narrowing of the supraglottic airway (numerically estimated at 85%) appears related to masslike circumferential soft tissue. The appearance is suspicious for a laryngeal neoplasm but supraglottic inflammation or other etiology remains possible. Motion artifact on this study is such that the finding may have been better demonstrated on the chest CTA today. 2. No lymphadenopathy is evident. And other neck soft tissues appear within normal limits when allowing for motion artifact. Electronically Signed: By: Genevie Ann M.D. On: 05/30/2017 17:32   Ct Angio Chest Pe W/cm &/or Wo Cm  Result Date: 05/30/2017 CLINICAL DATA:  72 year old male with shortness of breath, stridor. EXAM: CT ANGIOGRAPHY CHEST WITH CONTRAST TECHNIQUE: Multidetector CT imaging of the chest was performed using the standard protocol during bolus  administration of intravenous contrast. Multiplanar CT image reconstructions and MIPs were obtained to evaluate the vascular anatomy. CONTRAST:  100 milliliters ISOVUE-370 IOPAMIDOL (ISOVUE-370) INJECTION 76% COMPARISON:  Portable chest x-ray 1327 hours today. FINDINGS: Cardiovascular: Suboptimal contrast bolus timing in the pulmonary arterial tree. Superimposed lower lung respiratory motion artifact. There is no central or hilar pulmonary artery filling defect. The upper lobe pulmonary arteries also appear patent. The lower lobe pulmonary arteries are not well evaluated. Borderline to mild cardiomegaly. No pericardial effusion. Mild Calcified aortic atherosclerosis. Calcified coronary artery atherosclerosis is suspected. Mediastinum/Nodes: No mediastinal lymphadenopathy. Lungs/Pleura: The trachea is patent with globular retained secretions along the right lateral wall of the trachea and tracking into the right mainstem bronchus, but the right side airways remain patent. There is mild retained secretions in the left mainstem bronchus. Bilateral centrilobular emphysema. The upper lobes are clear. Right middle lobe is clear aside from mild medial segment scarring or atelectasis. There is confluent bilateral dependent/peripheral lower lobe opacity which appears atypical for atelectasis. There is mild similar confluent opacity in the dependent lingula. No pleural effusion. Upper Abdomen: Negative visible liver, spleen, pancreas, and adrenal glands. The visible kidneys appear normal aside from upper pole simple appearing cyst. The visible stomach is distended. There is intermittent gas in the thoracic esophagus, but overall the esophagus remains within normal limits. Musculoskeletal: Advanced cervical spine degeneration. No acute osseous abnormality identified. Other findings: The subglottic larynx and vocal cord level appear within normal limits (series 2, image 21). There is masslike soft tissue thickening in the  supraglottic larynx which is somewhat circumferential but more pronounced on the right (series 3, image 24). There is subsequent mild to moderate narrowing of the supraglottic airway. No lower cervical lymphadenopathy is evident. Review of the MIP images confirms the above findings. IMPRESSION: 1. Supraglottic airway narrowing related to mass-like soft tissue which is circumferential but greater on the right. No lower cervical or thoracic inlet lymphadenopathy is evident. Recommend ENT consultation and see also CT Neck reported separately. 2. No acute pulmonary embolus identified although the bilateral lower lobe pulmonary arteries are not well evaluated due to motion. 3. Small volume of retained or aspirated secretions in the trachea and both mainstem bronchi, but the major airways remain patent. 4. Confluent dependent bilateral lower lobe opacity which is beyond that typical of atelectasis. In light of #3, pulmonary aspiration is possible. No pleural effusion. 5. Underlying Emphysema (ICD10-J43.9). Electronically Signed  By: Genevie Ann M.D.   On: 05/30/2017 17:22   Dg Chest Port 1 View  Result Date: 06/01/2017 CLINICAL DATA:  Respiratory failure. EXAM: PORTABLE CHEST 1 VIEW COMPARISON:  05/30/2017. FINDINGS: Tracheostomy tube in stable position. Heart size stable. Improved aeration right lung base. Persistent mild atelectasis/pleuroparenchymal scarring left lung base. Small left pleural effusion no pneumothorax. IMPRESSION: 1.  Tracheostomy tube in stable position. 2. Improved aeration right lung base. Persistent mild atelectasis/pleuroparenchymal scarring left lung base. Electronically Signed   By: Marcello Moores  Register   On: 06/01/2017 07:07   Dg Chest Port 1 View  Result Date: 05/30/2017 CLINICAL DATA:  72 year old male with history of acute respiratory failure. Former smoker. EXAM: PORTABLE CHEST 1 VIEW COMPARISON:  Chest x-ray 05/30/2017. FINDINGS: Tracheostomy tube in position with tip terminating 7 cm  above the carina. Lung volumes are low. Bibasilar linear opacities favored to reflect areas of subsegmental atelectasis and/or scarring. No acute consolidative airspace disease. No pleural effusions. No suspicious appearing pulmonary nodules or masses. Heart size is normal. The patient is rotated to the right on today's exam, resulting in distortion of the mediastinal contours and reduced diagnostic sensitivity and specificity for mediastinal pathology. IMPRESSION: 1. Low lung volumes with areas of scarring and/or subsegmental atelectasis in the lower lobes of the lungs bilaterally. Electronically Signed   By: Vinnie Langton M.D.   On: 05/30/2017 20:33   Dg Chest Port 1 View  Result Date: 05/30/2017 CLINICAL DATA:  Hypoxemia.  Ex smoker. EXAM: PORTABLE CHEST 1 VIEW COMPARISON:  None. FINDINGS: The heart is mildly enlarged. There is volume loss at the LEFT base, elevated hemidiaphragm, likely subsegmental atelectasis with small effusion. Early consolidation not excluded. RIGHT lung is clear. No visible bony abnormality. IMPRESSION: LEFT base process of uncertain etiology. Elevated LEFT hemidiaphragm, with LEFT effusion. Subsegmental atelectasis with or without consolidation is possible. Cardiomegaly. Electronically Signed   By: Staci Righter M.D.   On: 05/30/2017 14:29   Dg Swallowing Func-speech Pathology  Result Date: 06/02/2017 Objective Swallowing Evaluation: Type of Study: Bedside Swallow Evaluation  Patient Details Name: Deondrick Searls MRN: [id number] Date of Birth: Oct 03, 1945 Today's Date: 06/02/2017 Time: SLP Start Time (ACUTE ONLY): 0815 -SLP Stop Time (ACUTE ONLY): 0845 SLP Time Calculation (min) (ACUTE ONLY): 30 min Past Medical History: Past Medical History: Diagnosis Date . Diabetes mellitus without complication ([place])  . Hypertension  Past Surgical History: Past Surgical History: Procedure Laterality Date . HERNIA REPAIR   . TRACHEOSTOMY TUBE PLACEMENT N/A 05/30/2017  Procedure: TRACHEOSTOMY,  LARYNGEAL MASS BIOPSY;  Surgeon: [name], MD;  Location: [facility];  Service: ENT;  Laterality: N/A; HPI: pt is a 72 yo adm to hospital with AMS, found to have a supraglottic laryngeal mass with narrowing up to 85%, mild thickening of epiglottis but other structures patent.  Pt is s/p emergent trach and order for swallow/PMSV evaluations received. CXR showed Low lung volumes with areas of scarring and/or subsegmental atelectasis in the lower lobes of the lungs bilaterally.     Pt denies h/o dysphagia and admits to dysphonia.  Subjective: pt awake in chair Assessment / Plan / Recommendation CHL IP CLINICAL IMPRESSIONS 06/02/2017 Clinical Impression  Patient presents with gross pharyngo-cervical esophageal dysphagia with very poor motility and absent clearance of barium into esophagus.  Pt is overtly aspirating barium with secretions with poor clearance despite cued coughing.  Head turn left nor right with cued dry swallow not helpful and head turn left actually worsened airway protection and dumped pyriform sinus residuals  into open airway.  Pt did cough and expectorate small portion of barium mixed with secretions via trach without pmsv and orally with pmsv in place with cues. However he did not adequately clear pharynx/larynx/trachea.   Did not test beyond 2 tsps of liquids total *1 thin, 1 nectar* due to severity of dysphagia.   Recommend npo and anticipate pt will benefit from consideration for long term alternative means of nutrition given severity of dysphagia.   Recommend to allow pt to swish and expectorate with water.  Using live video, educated pt to findings/recommendations.    SLP Visit Diagnosis Dysphagia, pharyngoesophageal phase (R13.14) Attention and concentration deficit following -- Frontal lobe and executive function deficit following -- Impact on safety and function Severe aspiration risk;Risk for inadequate nutrition/hydration   CHL IP TREATMENT RECOMMENDATION 06/02/2017 Treatment  Recommendations Therapy as outlined in treatment plan below   Prognosis 06/02/2017 Prognosis for Safe Diet Advancement Guarded Barriers to Reach Goals Severity of deficits Barriers/Prognosis Comment -- CHL IP DIET RECOMMENDATION 06/02/2017 SLP Diet Recommendations Other (Comment);NPO Liquid Administration via -- Medication Administration Via alternative means Compensations -- Postural Changes Remain semi-upright after after feeds/meals (Comment);Seated upright at 90 degrees   CHL IP OTHER RECOMMENDATIONS 06/02/2017 Recommended Consults -- Oral Care Recommendations Oral care QID Other Recommendations --   CHL IP FOLLOW UP RECOMMENDATIONS 06/01/2017 Follow up Recommendations Home health SLP;Outpatient SLP   CHL IP FREQUENCY AND DURATION 06/01/2017 Speech Therapy Frequency (ACUTE ONLY) min 2x/week Treatment Duration 2 weeks      CHL IP ORAL PHASE 06/02/2017 Oral Phase WFL Oral - Pudding Teaspoon -- Oral - Pudding Cup -- Oral - Honey Teaspoon -- Oral - Honey Cup -- Oral - Nectar Teaspoon -- Oral - Nectar Cup WFL Oral - Nectar Straw -- Oral - Thin Teaspoon WFL Oral - Thin Cup -- Oral - Thin Straw -- Oral - Puree -- Oral - Mech Soft -- Oral - Regular -- Oral - Multi-Consistency -- Oral - Pill -- Oral Phase - Comment --  CHL IP PHARYNGEAL PHASE 06/02/2017 Pharyngeal Phase Impaired Pharyngeal- Pudding Teaspoon -- Pharyngeal -- Pharyngeal- Pudding Cup -- Pharyngeal -- Pharyngeal- Honey Teaspoon -- Pharyngeal -- Pharyngeal- Honey Cup -- Pharyngeal -- Pharyngeal- Nectar Teaspoon Reduced epiglottic inversion;Reduced tongue base retraction;Significant aspiration (Amount);Pharyngeal residue - pyriform;Pharyngeal residue - valleculae;Penetration/Aspiration during swallow;Penetration/Apiration after swallow;Reduced laryngeal elevation;Reduced airway/laryngeal closure;Reduced pharyngeal peristalsis;Lateral channel residue Pharyngeal Material enters airway, passes BELOW cords without attempt by patient to eject out (silent  aspiration);Material enters airway, passes BELOW cords and not ejected out despite cough attempt by patient Pharyngeal- Nectar Cup -- Pharyngeal -- Pharyngeal- Nectar Straw -- Pharyngeal -- Pharyngeal- Thin Teaspoon Significant aspiration (Amount);Reduced epiglottic inversion;Reduced anterior laryngeal mobility;Reduced laryngeal elevation;Reduced airway/laryngeal closure;Reduced tongue base retraction;Penetration/Aspiration during swallow;Penetration/Apiration after swallow;Reduced pharyngeal peristalsis;Lateral channel residue Pharyngeal Material enters airway, passes BELOW cords without attempt by patient to eject out (silent aspiration);Material enters airway, passes BELOW cords and not ejected out despite cough attempt by patient Pharyngeal- Thin Cup -- Pharyngeal -- Pharyngeal- Thin Straw -- Pharyngeal -- Pharyngeal- Puree -- Pharyngeal -- Pharyngeal- Mechanical Soft -- Pharyngeal -- Pharyngeal- Regular -- Pharyngeal -- Pharyngeal- Multi-consistency -- Pharyngeal -- Pharyngeal- Pill -- Pharyngeal -- Pharyngeal Comment head turn right/left, cued dry swallows not effective to help clear residuals, pt did clear portion of secretions/barium through tracheostomy tube without pmsv in place and in oral cavity with pmsv in place  CHL IP CERVICAL ESOPHAGEAL PHASE 06/02/2017 Cervical Esophageal Phase Impaired Pudding Teaspoon -- Pudding Cup -- Honey Teaspoon -- Honey Cup --  Nectar Teaspoon Reduced cricopharyngeal relaxation Nectar Cup -- Nectar Straw -- Thin Teaspoon Reduced cricopharyngeal relaxation Thin Cup -- Thin Straw -- Puree -- Mechanical Soft -- Regular -- Multi-consistency -- Pill -- Cervical Esophageal Comment -- No flowsheet data found. [name] 06/02/2017, 9:07 AM Luanna Salk, MS [facility] SLP [phone]                  IMPRESSION/PLAN: 1. 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 This is a very difficult situation given the  clinical scenario as well as with his prior history. His PCP, Dr. 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 He has not had recent radiotherapy but it appears he was recently found to have in situ disease again and was being set up for laser ablation of some type. This was supposed to happen this week or next. I have a call into the radiation department at the [facility] to obtain his records, and after talking with Dr. Katheran James, she indicated that there are two social workers at the [place], [facility], and [place] (last name unknown) who will be trying to reach out to the [facility] staff on the ICU floor. The patient is in agreement for SNF placement at the time of discharge as he does not have family or friends he would trust to help care for him or provide transportation for his care. At this point it is difficult to say what treatment he would receive without knowing his prior therapy. We will follow along, and anticipate he would come back to see Dr. Isidore Moos as an outpatient. 2. Possible psychosis at admission. I'm not certain he has any underlying issues suggesting psychosis. His train of thought in our interaction seemed to be that he was just desperate to share his concerns with [place]. I'll follow up with him as well. I spoke with his PCP regarding this, and she's cared for him since 2009 and does not recall a time where he's ever exhibited concerns for this. She does note that he's had PTSD from his time in the [place] as well as a prior history of antisocial personality disorder documented by another provider in 2005, but not any episodes she's witnessed to document this herself.   In a visit lasting 120 minutes, greater than 50% of the time was spent face to face and in floor time coordinating  the patient's history and care.    Carola Rhine, PAC

## 2017-06-02 NOTE — Progress Notes (Signed)
Patient ID: Vincent Hansen, male   DOB: 09-Nov-1945, 72 y.o.   MRN: [id number] Patient discussed at the head and neck tumor board this morning.  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  Staging is either T2, T3, or T4.  I was unable to assess the mobility of the vocal cord during the procedure because of the anesthetic.  There was too much motion artifact on the CT scan that was done in the emergency department to identify paraglottic, preepiglottic, or extralaryngeal involvement.  We talked about an additional CT scan to further stage.  We also discussed what treatment options are available given his psychiatric problems.  Treatment would either involve a total laryngectomy, partial laryngectomy, radiation therapy, or chemotherapy/radiation therapy, all depending on the staging.  Medical and radiation oncology are going to evaluate the patient in the next day or 2.

## 2017-06-02 NOTE — Progress Notes (Signed)
Modified Barium Swallow Progress Note  Patient Details  Name: Vincent Hansen MRN: [id number] Date of Birth: [birth year]  Today's Date: 06/02/2017  Modified Barium Swallow completed.  Full report located under Chart Review in the Imaging Section.  Brief recommendations include the following:  Clinical Impression  Patient presents with gross pharyngo-cervical esophageal dysphagia with very poor motility and absent clearance of barium into esophagus.  Pt is overtly aspirating barium with secretions with poor clearance despite cued coughing.  Head turn left nor right with cued dry swallow not helpful and head turn left actually worsened airway protection and dumped pyriform sinus residuals into open airway.  Pt did cough and expectorate small portion of barium mixed with secretions via trach without pmsv and orally with pmsv in place with cues. However he did not adequately clear pharynx/larynx/trachea.   Did not test beyond 2 tsps of liquids total *1 thin, 1 nectar* due to severity of dysphagia.    Recommend npo and anticipate pt will benefit from consideration for long term alternative means of nutrition given severity of dysphagia.   Recommend to allow pt to swish and expectorate with water.  Using live video, educated pt to findings/recommendations.     Swallow Evaluation Recommendations       SLP Diet Recommendations: Other (Comment);NPO(swish and expectorate with water)  Frequent oral care as pt is grossly aspirating secretions without ability to adequately clear       Medication Administration: Via alternative means           Postural Changes: Remain semi-upright after after feeds/meals (Comment);Seated upright at 90 degrees   Oral Care Recommendations: Oral care QID       Luanna Salk, MS [facility] SLP [phone]  [name] 06/02/2017,9:04 AM

## 2017-06-03 ENCOUNTER — Encounter: Payer: Self-pay | Admitting: *Deleted

## 2017-06-03 ENCOUNTER — Ambulatory Visit
Admit: 2017-06-03 | Discharge: 2017-06-03 | Disposition: A | Discharge status: Home / Self Care | Payer: Self-pay | Attending: Radiation Oncology | Admitting: Radiation Oncology

## 2017-06-03 ENCOUNTER — Other Ambulatory Visit: Payer: Self-pay | Admitting: Hematology and Oncology

## 2017-06-03 ENCOUNTER — Encounter (HOSPITAL_COMMUNITY): Payer: Self-pay | Admitting: Radiation Oncology

## 2017-06-03 DIAGNOSIS — R0902 Hypoxemia: Secondary | ICD-10-CM

## 2017-06-03 DIAGNOSIS — C329 Malignant neoplasm of larynx, unspecified: Secondary | ICD-10-CM | POA: Insufficient documentation

## 2017-06-03 DIAGNOSIS — E119 Type 2 diabetes mellitus without complications: Secondary | ICD-10-CM

## 2017-06-03 DIAGNOSIS — Z87891 Personal history of nicotine dependence: Secondary | ICD-10-CM

## 2017-06-03 DIAGNOSIS — I10 Essential (primary) hypertension: Secondary | ICD-10-CM

## 2017-06-03 LAB — GLUCOSE, CAPILLARY
GLUCOSE-CAPILLARY: 138 mg/dL — AB (ref 65–99)
GLUCOSE-CAPILLARY: 109 mg/dL — AB (ref 65–99)
GLUCOSE-CAPILLARY: 134 mg/dL — AB (ref 65–99)
GLUCOSE-CAPILLARY: 127 mg/dL — AB (ref 65–99)
Glucose-Capillary: 120 mg/dL — ABNORMAL HIGH (ref 65–99)
Glucose-Capillary: 133 mg/dL — ABNORMAL HIGH (ref 65–99)

## 2017-06-03 NOTE — Progress Notes (Signed)
06/03/17  1355  Per Ebony Hail, [place] Notified Juliann Pulse, CM that patient has CM at the [facility] and their names are [facility] and [facility], so she can follow up with them as needed.

## 2017-06-03 NOTE — Progress Notes (Addendum)
Oncology Nurse Navigator Documentation  Met with New Referral pt Vincent Hansen, [facility] 1230. Introduced self as H&N Navigator that works with Drs. Ginny Forth to whom he has been referred by ENT Dr. Constance Holster.   I explained I will be following him during this admission and as he proceeds with tmt at [facility], provided contact information.   We discussed benefit of PEG, I explained I will be offering him PEG education/support as he proceeds with tmt.   He voiced understanding of information provided. Will continue to follow during this admission.  Gayleen Orem, RN, BSN Head & Neck Oncology Nurse [place] [facility] [place] [phone]

## 2017-06-03 NOTE — Care Management Note (Signed)
Case Management Note  Patient Details  Name: Vincent Hansen MRN: [id number] Date of Birth: [birth year]  Subjective/Objective:  Received call about CM needs-after several calls to find out where the patient was following for pcp-confirmed [facility]([place])-tel#[phone]-provided our CSW(Nicole) with info to f/u on SNF-will need PT cons-nsg notified.                   Action/Plan:d/c plan SNF.   Expected Discharge Date:  (unknown)               Expected Discharge Plan:  Skilled Nursing Facility  In-House Referral:  Clinical Social Work  Discharge planning Services  CM Consult  Post Acute Care Choice:    Choice offered to:     DME Arranged:    DME Agency:     HH Arranged:    [place] Agency:     Status of Service:  In process, will continue to follow  If discussed at Long Length of Stay Meetings, dates discussed:    Additional Comments:  Dessa Phi, RN 06/03/2017, 3:24 PM

## 2017-06-03 NOTE — Plan of Care (Signed)
  Problem: Pain Managment: Goal: General experience of comfort will improve Outcome: Progressing   Problem: Safety: Goal: Ability to remain free from injury will improve Outcome: Progressing   Problem: Skin Integrity: Goal: Risk for impaired skin integrity will decrease Outcome: Progressing   

## 2017-06-03 NOTE — Consult Note (Signed)
New Hematology/Oncology Consult   Referral MD: Dr [name]    Reason for Referral: Squamous cell carcinoma of the larynx  HPI:  Vincent Hansen is a 72 y.o. male who was brought to the hospital on 33/54/56 by [employer] due to confusional state while being at home.  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  In the emergency room, patient found to be hypoxic with accompanying symptoms of shortness of breath for a long period of time without associated chest pain, or cough.  Cerumen examination revealed presence of stridor and imaging of the neck was obtained demonstrating a large laryngeal mass obstructing the airway.  Patient underwent emergency placement of tracheostomy by Dr Constance Holster on 05/30/17.  Tissue sample obtained during the procedure returned back positive for presence of squamous cell carcinoma.  Subsequent to tracheostomy placement, patient's respiratory status has been gradually improving.  At this time, patient finds it difficult to speak secondary to tracheostomy, but reports minimal amount of pain following the procedure and no pain in the upper neck.  Denies nausea, vomiting, abdominal pain, diarrhea, or constipation.  He reports that he has been unable to take any food for the previous 6-8 days.  Patient reports being hungry and is inquiring whether he can start eating again.       Past Medical History:  Diagnosis Date  . Diabetes mellitus without complication ([place])   . Hypertension   :  Past Surgical History:  Procedure Laterality Date  . HERNIA REPAIR    . TRACHEOSTOMY TUBE PLACEMENT N/A 05/30/2017   Procedure: TRACHEOSTOMY, LARYNGEAL MASS BIOPSY;  Surgeon: [name], MD;  Location: [facility];  Service: ENT;  Laterality: N/A;  :   Current Facility-Administered Medications:  .  arformoterol (BROVANA) nebulizer  solution 15 mcg, 15 mcg, Nebulization, BID, Dewaine Oats, Katalina M, NP, 15 mcg at 06/02/17 2002 .  budesonide (PULMICORT) nebulizer solution 0.5 mg, 0.5 mg, Nebulization, BID, Eubanks, Katalina M, NP, 0.5 mg at 06/02/17 2002 .  chlorhexidine gluconate (MEDLINE KIT) (PERIDEX) 0.12 % solution 15 mL, 15 mL, Mouth Rinse, BID, Anders Simmonds, MD, 15 mL at 06/02/17 2110 .  fentaNYL (SUBLIMAZE) injection 50-100 mcg, 50-100 mcg, Intravenous, Q2H PRN, Chesley Mires, MD, 100 mcg at 06/02/17 1817 .  insulin aspart (novoLOG) injection 0-15 Units, 0-15 Units, Subcutaneous, Q4H, Omar Person, NP, 2 Units at 06/03/17 0030 .  ipratropium-albuterol (DUONEB) 0.5-2.5 (3) MG/3ML nebulizer solution 3 mL, 3 mL, Nebulization, Q4H PRN, Chesley Mires, MD .  MEDLINE mouth rinse, 15 mL, Mouth Rinse, QID, Anders Simmonds, MD, 15 mL at 06/02/17 1700 .  pantoprazole (PROTONIX) injection 40 mg, 40 mg, Intravenous, Q24H, Eubanks, Katalina M, NP, 40 mg at 06/02/17 2230:  . arformoterol  15 mcg Nebulization BID  . budesonide (PULMICORT) nebulizer solution  0.5 mg Nebulization BID  . chlorhexidine gluconate (MEDLINE KIT)  15 mL Mouth Rinse BID  . insulin aspart  0-15 Units Subcutaneous Q4H  . mouth rinse  15 mL Mouth Rinse QID  . pantoprazole (PROTONIX) IV  40 mg Intravenous Q24H  :  No Known Allergies:   Family History  Problem Relation Age of Onset  . Cancer Neg Hx    Social History   Socioeconomic History  . Marital status: Single    Spouse name: Not on file  . Number of children: Not on file  .  Years of education: Not on file  . Highest education level: Not on file  Occupational History  . Not on file  Social Needs  . Financial resource strain: Not on file  . Food insecurity:    Worry: Not on file    Inability: Not on file  . Transportation needs:    Medical: Not on file    Non-medical: Not on file  Tobacco Use  . Smoking status: Former Research scientist (life sciences)  . Smokeless tobacco: Never Used  Substance and Sexual  Activity  . Alcohol use: No  . Drug use: Not on file  . Sexual activity: Not on file  Lifestyle  . Physical activity:    Days per week: Not on file    Minutes per session: Not on file  . Stress: Not on file  Relationships  . Social connections:    Talks on phone: Not on file    Gets together: Not on file    Attends religious service: Not on file    Active member of club or organization: Not on file    Attends meetings of clubs or organizations: Not on file    Relationship status: Not on file  . Intimate partner violence:    Fear of current or ex partner: Not on file    Emotionally abused: Not on file    Physically abused: Not on file    Forced sexual activity: Not on file  Other Topics Concern  . Not on file  Social History Narrative  . Not on file   Review of Systems: As per HPI.  All other systems are negative.  Physical Exam:  Blood pressure (!) 148/91, pulse (!) 41, temperature 97.6 F (36.4 C), temperature source Oral, resp. rate (!) 23, height 6' (1.829 m), weight 159 lb 2.8 oz (72.2 kg), SpO2 96 %.  Patient appears to be alert, awake, oriented x3. HEENT: Tracheostomy in place with some thick mucus production.  No bleeding.  Palpation of the neck does not reveal distinct mass, but some asymmetry and irregular shape of the larynx is noted. Lungs: Noisy breath sounds owing to presence of tracheostomy. Cardiac: S1/S2, regular, no murmurs. Abdomen: Soft, nontender, nondistended. Lymph nodes: No palpable lymphadenopathy in the neck, supraclavicular areas, axillary, or inguinal regions. Neurologic: No gross focal neurological deficits Mentation: Patient does not appear to have any significant signs of internal stimuli response.  Answers questions appropriately and asks appropriate questions in the return Skin: Dry, thin, no rash or petechiae. Musculoskeletal: No lower extremity edema.  LABS:  Recent Labs    06/01/17 0342  WBC 10.3  HGB 12.6*  HCT 39.9  PLT 228     Recent Labs    06/01/17 0342  NA 143  K 3.8  CL 100*  CO2 30  GLUCOSE 143*  BUN 20  CREATININE 1.37*  CALCIUM 9.0      RADIOLOGY:  Ct Head Wo Contrast  Result Date: 05/30/2017 CLINICAL DATA:  72 year old male with hallucinations, shortness of breath, stridor. EXAM: CT HEAD WITHOUT CONTRAST TECHNIQUE: Contiguous axial images were obtained from the base of the skull through the vertex without intravenous contrast. COMPARISON:  No prior head CT. FINDINGS: Study is mildly degraded by motion artifact despite repeated imaging attempts. Brain: Cerebral volume is within normal limits for age. Incidental dural calcifications. No midline shift, ventriculomegaly, mass effect, evidence of mass lesion, intracranial hemorrhage or evidence of cortically based acute infarction. Gray-white matter differentiation is within normal limits throughout the brain. Vascular: Calcified atherosclerosis at  the skull base. Skull: Negative. Sinuses/Orbits: Clear. Other: Visualized orbits soft tissues are within normal limits. Possible broad-based left posterior convexity scalp hematoma measuring up to 7 millimeters in thickness (series 8, image 19). Underlying calvarium appears intact. Other scalp soft tissues appear negative. IMPRESSION: 1. Normal for age non contrast CT appearance of the brain allowing for some motion artifact. 2. Mild left posterior convexity scalp hematoma suspected. No underlying skull fracture. Electronically Signed   By: Genevie Ann M.D.   On: 05/30/2017 17:12   Ct Soft Tissue Neck W Contrast  Addendum Date: 05/30/2017   ADDENDUM REPORT: 05/30/2017 17:44 ADDENDUM: Study discussed by telephone with Dr. Laverta Baltimore in the ED on 05/30/2017 at 1734 hours. Electronically Signed   By: Genevie Ann M.D.   On: 05/30/2017 17:44   Result Date: 05/30/2017 CLINICAL DATA:  72 year old male with shortness of breath, stridor. EXAM: CT NECK WITH CONTRAST TECHNIQUE: Multidetector CT imaging of the neck was performed using  the standard protocol following the bolus administration of intravenous contrast. CONTRAST:  100 milliliters ISOVUE-370 IOPAMIDOL (ISOVUE-370) INJECTION 76% in conjunction with contrast enhanced imaging of the chest reported separately. COMPARISON:  Chest CTA and noncontrast head CT today reported separately. FINDINGS: Study is moderately degraded by motion. Pharynx and larynx: There is pronounced motion artifact at the glottis, supraglottic larynx, hypopharynx and oropharynx. Subsequently, the supraglottic larynx was probably better demonstrated on the chest CTA today. Masslike circumferential intermediate density in the supraglottic larynx with an estimated 85% narrowing of the airway when considering cross-sectional area. As on the chest CT today this appears somewhat eccentric to the right. The laryngeal cartilages are not overtly destroyed. No definite para-laryngeal space invasion. There is only mild generalized thickening of the epiglottis. The hypopharynx and oropharynx are patent. The nasopharyngeal contour is within normal limits. Grossly negative parapharyngeal and retropharyngeal spaces. Salivary glands: Negative Lau in for motion artifact. Thyroid: Negative. Lymph nodes: No cervical lymphadenopathy is evident. Vascular: The bilateral carotid arteries and jugular veins appear patent to the skull base. The bilateral vertebral arteries are patent. There is calcified atherosclerosis in the neck and at the skull base. Limited intracranial: Negative. Visualized orbits: Negative. Mastoids and visualized paranasal sinuses: Mild sinus mucosal thickening. Skeleton: Widespread severe cervical spine degeneration. No acute osseous abnormality identified. Upper chest: Reported separately today. IMPRESSION: 1. Moderate to severe narrowing of the supraglottic airway (numerically estimated at 85%) appears related to masslike circumferential soft tissue. The appearance is suspicious for a laryngeal neoplasm but  supraglottic inflammation or other etiology remains possible. Motion artifact on this study is such that the finding may have been better demonstrated on the chest CTA today. 2. No lymphadenopathy is evident. And other neck soft tissues appear within normal limits when allowing for motion artifact. Electronically Signed: By: Genevie Ann M.D. On: 05/30/2017 17:32   Ct Angio Chest Pe W/cm &/or Wo Cm  Result Date: 05/30/2017 CLINICAL DATA:  72 year old male with shortness of breath, stridor. EXAM: CT ANGIOGRAPHY CHEST WITH CONTRAST TECHNIQUE: Multidetector CT imaging of the chest was performed using the standard protocol during bolus administration of intravenous contrast. Multiplanar CT image reconstructions and MIPs were obtained to evaluate the vascular anatomy. CONTRAST:  100 milliliters ISOVUE-370 IOPAMIDOL (ISOVUE-370) INJECTION 76% COMPARISON:  Portable chest x-ray 1327 hours today. FINDINGS: Cardiovascular: Suboptimal contrast bolus timing in the pulmonary arterial tree. Superimposed lower lung respiratory motion artifact. There is no central or hilar pulmonary artery filling defect. The upper lobe pulmonary arteries also appear patent. The lower lobe  pulmonary arteries are not well evaluated. Borderline to mild cardiomegaly. No pericardial effusion. Mild Calcified aortic atherosclerosis. Calcified coronary artery atherosclerosis is suspected. Mediastinum/Nodes: No mediastinal lymphadenopathy. Lungs/Pleura: The trachea is patent with globular retained secretions along the right lateral wall of the trachea and tracking into the right mainstem bronchus, but the right side airways remain patent. There is mild retained secretions in the left mainstem bronchus. Bilateral centrilobular emphysema. The upper lobes are clear. Right middle lobe is clear aside from mild medial segment scarring or atelectasis. There is confluent bilateral dependent/peripheral lower lobe opacity which appears atypical for atelectasis. There  is mild similar confluent opacity in the dependent lingula. No pleural effusion. Upper Abdomen: Negative visible liver, spleen, pancreas, and adrenal glands. The visible kidneys appear normal aside from upper pole simple appearing cyst. The visible stomach is distended. There is intermittent gas in the thoracic esophagus, but overall the esophagus remains within normal limits. Musculoskeletal: Advanced cervical spine degeneration. No acute osseous abnormality identified. Other findings: The subglottic larynx and vocal cord level appear within normal limits (series 2, image 21). There is masslike soft tissue thickening in the supraglottic larynx which is somewhat circumferential but more pronounced on the right (series 3, image 24). There is subsequent mild to moderate narrowing of the supraglottic airway. No lower cervical lymphadenopathy is evident. Review of the MIP images confirms the above findings. IMPRESSION: 1. Supraglottic airway narrowing related to mass-like soft tissue which is circumferential but greater on the right. No lower cervical or thoracic inlet lymphadenopathy is evident. Recommend ENT consultation and see also CT Neck reported separately. 2. No acute pulmonary embolus identified although the bilateral lower lobe pulmonary arteries are not well evaluated due to motion. 3. Small volume of retained or aspirated secretions in the trachea and both mainstem bronchi, but the major airways remain patent. 4. Confluent dependent bilateral lower lobe opacity which is beyond that typical of atelectasis. In light of #3, pulmonary aspiration is possible. No pleural effusion. 5. Underlying Emphysema (ICD10-J43.9). Electronically Signed   By: Genevie Ann M.D.   On: 05/30/2017 17:22   Dg Chest Port 1 View  Result Date: 06/01/2017 CLINICAL DATA:  Respiratory failure. EXAM: PORTABLE CHEST 1 VIEW COMPARISON:  05/30/2017. FINDINGS: Tracheostomy tube in stable position. Heart size stable. Improved aeration right  lung base. Persistent mild atelectasis/pleuroparenchymal scarring left lung base. Small left pleural effusion no pneumothorax. IMPRESSION: 1.  Tracheostomy tube in stable position. 2. Improved aeration right lung base. Persistent mild atelectasis/pleuroparenchymal scarring left lung base. Electronically Signed   By: Marcello Moores  Register   On: 06/01/2017 07:07   Dg Chest Port 1 View  Result Date: 05/30/2017 CLINICAL DATA:  72 year old male with history of acute respiratory failure. Former smoker. EXAM: PORTABLE CHEST 1 VIEW COMPARISON:  Chest x-ray 05/30/2017. FINDINGS: Tracheostomy tube in position with tip terminating 7 cm above the carina. Lung volumes are low. Bibasilar linear opacities favored to reflect areas of subsegmental atelectasis and/or scarring. No acute consolidative airspace disease. No pleural effusions. No suspicious appearing pulmonary nodules or masses. Heart size is normal. The patient is rotated to the right on today's exam, resulting in distortion of the mediastinal contours and reduced diagnostic sensitivity and specificity for mediastinal pathology. IMPRESSION: 1. Low lung volumes with areas of scarring and/or subsegmental atelectasis in the lower lobes of the lungs bilaterally. Electronically Signed   By: Vinnie Langton M.D.   On: 05/30/2017 20:33   Dg Chest Port 1 View  Result Date: 05/30/2017 CLINICAL DATA:  Hypoxemia.  Ex smoker. EXAM: PORTABLE CHEST 1 VIEW COMPARISON:  None. FINDINGS: The heart is mildly enlarged. There is volume loss at the LEFT base, elevated hemidiaphragm, likely subsegmental atelectasis with small effusion. Early consolidation not excluded. RIGHT lung is clear. No visible bony abnormality. IMPRESSION: LEFT base process of uncertain etiology. Elevated LEFT hemidiaphragm, with LEFT effusion. Subsegmental atelectasis with or without consolidation is possible. Cardiomegaly. Electronically Signed   By: Staci Righter M.D.   On: 05/30/2017 14:29   Dg Swallowing  Func-speech Pathology  Result Date: 06/02/2017 Objective Swallowing Evaluation: Type of Study: Bedside Swallow Evaluation  Patient Details Name: Tiago Humphrey MRN: [id number] Date of Birth: December 28, 1945 Today's Date: 06/02/2017 Time: SLP Start Time (ACUTE ONLY): 0815 -SLP Stop Time (ACUTE ONLY): 0845 SLP Time Calculation (min) (ACUTE ONLY): 30 min Past Medical History: Past Medical History: Diagnosis Date . Diabetes mellitus without complication ([place])  . Hypertension  Past Surgical History: Past Surgical History: Procedure Laterality Date . HERNIA REPAIR   . TRACHEOSTOMY TUBE PLACEMENT N/A 05/30/2017  Procedure: TRACHEOSTOMY, LARYNGEAL MASS BIOPSY;  Surgeon: [name], MD;  Location: [facility];  Service: ENT;  Laterality: N/A; HPI: pt is a 72 yo adm to hospital with AMS, found to have a supraglottic laryngeal mass with narrowing up to 85%, mild thickening of epiglottis but other structures patent.  Pt is s/p emergent trach and order for swallow/PMSV evaluations received. CXR showed Low lung volumes with areas of scarring and/or subsegmental atelectasis in the lower lobes of the lungs bilaterally.     Pt denies h/o dysphagia and admits to dysphonia.  Subjective: pt awake in chair Assessment / Plan / Recommendation CHL IP CLINICAL IMPRESSIONS 06/02/2017 Clinical Impression  Patient presents with gross pharyngo-cervical esophageal dysphagia with very poor motility and absent clearance of barium into esophagus.  Pt is overtly aspirating barium with secretions with poor clearance despite cued coughing.  Head turn left nor right with cued dry swallow not helpful and head turn left actually worsened airway protection and dumped pyriform sinus residuals into open airway.  Pt did cough and expectorate small portion of barium mixed with secretions via trach without pmsv and orally with pmsv in place with cues. However he did not adequately clear pharynx/larynx/trachea.   Did not test beyond 2 tsps of liquids total *1 thin, 1  nectar* due to severity of dysphagia.   Recommend npo and anticipate pt will benefit from consideration for long term alternative means of nutrition given severity of dysphagia.   Recommend to allow pt to swish and expectorate with water.  Using live video, educated pt to findings/recommendations.    SLP Visit Diagnosis Dysphagia, pharyngoesophageal phase (R13.14) Attention and concentration deficit following -- Frontal lobe and executive function deficit following -- Impact on safety and function Severe aspiration risk;Risk for inadequate nutrition/hydration   CHL IP TREATMENT RECOMMENDATION 06/02/2017 Treatment Recommendations Therapy as outlined in treatment plan below   Prognosis 06/02/2017 Prognosis for Safe Diet Advancement Guarded Barriers to Reach Goals Severity of deficits Barriers/Prognosis Comment -- CHL IP DIET RECOMMENDATION 06/02/2017 SLP Diet Recommendations Other (Comment);NPO Liquid Administration via -- Medication Administration Via alternative means Compensations -- Postural Changes Remain semi-upright after after feeds/meals (Comment);Seated upright at 90 degrees   CHL IP OTHER RECOMMENDATIONS 06/02/2017 Recommended Consults -- Oral Care Recommendations Oral care QID Other Recommendations --   CHL IP FOLLOW UP RECOMMENDATIONS 06/01/2017 Follow up Recommendations Home health SLP;Outpatient SLP   CHL IP FREQUENCY AND DURATION 06/01/2017 Speech Therapy Frequency (ACUTE ONLY) min 2x/week Treatment Duration 2  weeks      CHL IP ORAL PHASE 06/02/2017 Oral Phase WFL Oral - Pudding Teaspoon -- Oral - Pudding Cup -- Oral - Honey Teaspoon -- Oral - Honey Cup -- Oral - Nectar Teaspoon -- Oral - Nectar Cup WFL Oral - Nectar Straw -- Oral - Thin Teaspoon WFL Oral - Thin Cup -- Oral - Thin Straw -- Oral - Puree -- Oral - Mech Soft -- Oral - Regular -- Oral - Multi-Consistency -- Oral - Pill -- Oral Phase - Comment --  CHL IP PHARYNGEAL PHASE 06/02/2017 Pharyngeal Phase Impaired Pharyngeal- Pudding Teaspoon --  Pharyngeal -- Pharyngeal- Pudding Cup -- Pharyngeal -- Pharyngeal- Honey Teaspoon -- Pharyngeal -- Pharyngeal- Honey Cup -- Pharyngeal -- Pharyngeal- Nectar Teaspoon Reduced epiglottic inversion;Reduced tongue base retraction;Significant aspiration (Amount);Pharyngeal residue - pyriform;Pharyngeal residue - valleculae;Penetration/Aspiration during swallow;Penetration/Apiration after swallow;Reduced laryngeal elevation;Reduced airway/laryngeal closure;Reduced pharyngeal peristalsis;Lateral channel residue Pharyngeal Material enters airway, passes BELOW cords without attempt by patient to eject out (silent aspiration);Material enters airway, passes BELOW cords and not ejected out despite cough attempt by patient Pharyngeal- Nectar Cup -- Pharyngeal -- Pharyngeal- Nectar Straw -- Pharyngeal -- Pharyngeal- Thin Teaspoon Significant aspiration (Amount);Reduced epiglottic inversion;Reduced anterior laryngeal mobility;Reduced laryngeal elevation;Reduced airway/laryngeal closure;Reduced tongue base retraction;Penetration/Aspiration during swallow;Penetration/Apiration after swallow;Reduced pharyngeal peristalsis;Lateral channel residue Pharyngeal Material enters airway, passes BELOW cords without attempt by patient to eject out (silent aspiration);Material enters airway, passes BELOW cords and not ejected out despite cough attempt by patient Pharyngeal- Thin Cup -- Pharyngeal -- Pharyngeal- Thin Straw -- Pharyngeal -- Pharyngeal- Puree -- Pharyngeal -- Pharyngeal- Mechanical Soft -- Pharyngeal -- Pharyngeal- Regular -- Pharyngeal -- Pharyngeal- Multi-consistency -- Pharyngeal -- Pharyngeal- Pill -- Pharyngeal -- Pharyngeal Comment head turn right/left, cued dry swallows not effective to help clear residuals, pt did clear portion of secretions/barium through tracheostomy tube without pmsv in place and in oral cavity with pmsv in place  CHL IP CERVICAL ESOPHAGEAL PHASE 06/02/2017 Cervical Esophageal Phase Impaired Pudding  Teaspoon -- Pudding Cup -- Honey Teaspoon -- Honey Cup -- Nectar Teaspoon Reduced cricopharyngeal relaxation Nectar Cup -- Nectar Straw -- Thin Teaspoon Reduced cricopharyngeal relaxation Thin Cup -- Thin Straw -- Puree -- Mechanical Soft -- Regular -- Multi-consistency -- Pill -- Cervical Esophageal Comment -- No flowsheet data found. [name] 06/02/2017, 9:07 AM Luanna Salk, MS [facility] SLP [phone]               Assessment and Plan:  72 y.o. 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  Abnormal swallowing making nutrition resumption difficult despite patient's desire to eat.  Case discussed with the tumor board yesterday.  Additionally, case has been discussed with interventional radiology for possible consideration of placement of Infuse-a-Port and PEG tube while still hospitalized.  They have reviewed the case and would prefer to proceed with gastroenterology evaluation due to thickening of the gastroesophageal junction on the available imaging suggesting possible presence of a second malignancy in the location.    Recommendations: -Consult enterology for possible EGD to assess for possible presence of second malignancy -Defer placement of PEG tube until that is cleared. -Patient has established relationship with the hospital in [place] which likely some accessed oncological care from [facility].  With that in mind, my preference would be for the patient to return to the hospital to address his oncological care there.   Ardath Sax, MD 06/03/2017, 7:46 AM

## 2017-06-04 DIAGNOSIS — I10 Essential (primary) hypertension: Secondary | ICD-10-CM | POA: Diagnosis present

## 2017-06-04 DIAGNOSIS — E119 Type 2 diabetes mellitus without complications: Secondary | ICD-10-CM

## 2017-06-04 DIAGNOSIS — Z01818 Encounter for other preprocedural examination: Secondary | ICD-10-CM

## 2017-06-04 DIAGNOSIS — C329 Malignant neoplasm of larynx, unspecified: Secondary | ICD-10-CM

## 2017-06-04 LAB — CBC
HCT: 46.8 % (ref 39.0–52.0)
Hemoglobin: 14.8 g/dL (ref 13.0–17.0)
MCH: 27.6 pg (ref 26.0–34.0)
MCHC: 31.6 g/dL (ref 30.0–36.0)
MCV: 87.3 fL (ref 78.0–100.0)
PLATELETS: 302 10*3/uL (ref 150–400)
RBC: 5.36 MIL/uL (ref 4.22–5.81)
RDW: 14.1 % (ref 11.5–15.5)
WBC: 11.9 10*3/uL — ABNORMAL HIGH (ref 4.0–10.5)

## 2017-06-04 LAB — COMPREHENSIVE METABOLIC PANEL
ALT: 27 U/L (ref 17–63)
ANION GAP: 22 — AB (ref 5–15)
AST: 19 U/L (ref 15–41)
Albumin: 3.7 g/dL (ref 3.5–5.0)
Alkaline Phosphatase: 76 U/L (ref 38–126)
BUN: 39 mg/dL — ABNORMAL HIGH (ref 6–20)
CHLORIDE: 105 mmol/L (ref 101–111)
CO2: 25 mmol/L (ref 22–32)
Calcium: 10.2 mg/dL (ref 8.9–10.3)
Creatinine, Ser: 1.56 mg/dL — ABNORMAL HIGH (ref 0.61–1.24)
GFR, EST AFRICAN AMERICAN: 50 mL/min — AB (ref >60)
GFR, EST NON AFRICAN AMERICAN: 43 mL/min — AB (ref >60)
Glucose, Bld: 174 mg/dL — ABNORMAL HIGH (ref 65–99)
POTASSIUM: 5 mmol/L (ref 3.5–5.1)
SODIUM: 152 mmol/L — AB (ref 135–145)
Total Bilirubin: 1.4 mg/dL — ABNORMAL HIGH (ref 0.3–1.2)
Total Protein: 7.7 g/dL (ref 6.5–8.1)

## 2017-06-04 LAB — GLUCOSE, CAPILLARY
GLUCOSE-CAPILLARY: 108 mg/dL — AB (ref 65–99)
GLUCOSE-CAPILLARY: 124 mg/dL — AB (ref 65–99)
GLUCOSE-CAPILLARY: 136 mg/dL — AB (ref 65–99)
GLUCOSE-CAPILLARY: 153 mg/dL — AB (ref 65–99)
Glucose-Capillary: 89 mg/dL (ref 65–99)
Glucose-Capillary: 135 mg/dL — ABNORMAL HIGH (ref 65–99)

## 2017-06-04 LAB — MAGNESIUM: MAGNESIUM: 2 mg/dL (ref 1.7–2.4)

## 2017-06-04 LAB — PHOSPHORUS: PHOSPHORUS: 3.7 mg/dL (ref 2.5–4.6)

## 2017-06-04 MED ORDER — POTASSIUM CHLORIDE IN NACL 20-0.45 MEQ/L-% IV SOLN
INTRAVENOUS | Status: DC
  Filled 2017-06-04: qty 1000

## 2017-06-04 MED ORDER — VITAMINS A & D EX OINT
TOPICAL_OINTMENT | CUTANEOUS | Status: AC
  Filled 2017-06-04: qty 5

## 2017-06-04 MED ORDER — SODIUM CHLORIDE 0.9 % IV BOLUS (SEPSIS)
500.0000 mL | Freq: Once | INTRAVENOUS | Status: AC
  Administered 2017-06-04: 500 mL via INTRAVENOUS

## 2017-06-04 MED ORDER — LABETALOL HCL 5 MG/ML IV SOLN
5.0000 mg | INTRAVENOUS | Status: DC | PRN
  Administered 2017-06-05: 5 mg via INTRAVENOUS
  Filled 2017-06-04 (×2): qty 4

## 2017-06-04 MED ORDER — DEXTROSE-NACL 5-0.45 % IV SOLN
INTRAVENOUS | Status: DC
  Administered 2017-06-04 – 2017-06-09 (×11): via INTRAVENOUS

## 2017-06-04 NOTE — Consult Note (Signed)
Medical Consultation   Vincent Hansen  XFG:[id number]  DOB: 12-06-1945  DOA: 05/30/2017  PCP: Patient, No Pcp Per  Outpatient Specialists: Oncology at [facility] in [place], Dr. Vallarie Mare   Requesting physician: Dr. Gevena Barre  Reason for consultation: Medical management   History of Present Illness: Vincent Hansen is an 72 y.o. male who was brought to the hospital on 96/78/93 by [employer] due to confusional state while being at home.  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  In the emergency room, patient found to be hypoxic with accompanying symptoms of shortness of breath for a long period of time without associated chest pain, or cough.  Exam revealed presence of stridor and imaging of the neck was obtained demonstrating a large laryngeal mass obstructing the airway.  Patient underwent emergency placement of tracheostomy by Dr Constance Holster on 05/30/17.  Tissue sample obtained during the procedure returned back positive for presence of squamous cell carcinoma.  Subsequent to tracheostomy placement, patient's respiratory status has been gradually improving.  He is now down to trach collar, talking through Passey-Muir valve, and satting well.  Unfortunately he failed his SLP swallow eval, and has been NPO.  Patients main complaint right now is being hungry and thirsty.   Review of Systems:  ROS As per HPI otherwise 10 point review of systems negative.     Past Medical History: Past Medical History:  Diagnosis Date  . Diabetes mellitus without complication ([place])   . Hypertension     Past Surgical History: Past Surgical History:  Procedure Laterality Date  . HERNIA REPAIR    . TRACHEOSTOMY TUBE PLACEMENT N/A 05/30/2017   Procedure: TRACHEOSTOMY, LARYNGEAL MASS BIOPSY;  Surgeon: [name], MD;  Location:  [facility];  Service: ENT;  Laterality: N/A;     Allergies:  No Known Allergies   Social History:  reports that he has quit smoking. He has never used smokeless tobacco. He reports that he does not drink alcohol. His drug history is not on file.   Family History: Family History  Problem Relation Age of Onset  . Cancer Neg Hx       Physical Exam: Vitals:   06/04/17 1500 06/04/17 1600 06/04/17 1800 06/04/17 1911  BP:  (!) 142/90 (!) 175/107   Pulse: 95 100 (!) 118   Resp: (!) 22 (!) 23 20   Temp:  98.1 F (36.7 C)  97.7 F (36.5 C)  TempSrc:  Oral  Oral  SpO2: 96% 97% 96%   Weight:      Height:        Constitutional: Alert and awake, oriented x3, not in any acute distress. Eyes: PERLA, EOMI, irises appear normal, anicteric sclera,  ENMT: Trach collar in place, Passey-Muir valve in place Neck: neck appears normal, no masses, normal ROM, no thyromegaly, no JVD  CVS: S1-S2 clear, no murmur rubs or gallops, no LE edema, normal pedal pulses  Respiratory:  clear to auscultation bilaterally, no wheezing, rales or rhonchi. Respiratory effort normal. No accessory muscle use.  Abdomen: soft nontender, nondistended, normal bowel sounds, no hepatosplenomegaly, no hernias  Musculoskeletal: : no cyanosis, clubbing or edema noted bilaterally Neuro: Cranial nerves II-XII intact, strength, sensation, reflexes Psych: judgement and insight appear normal, stable mood and affect, mental status Skin: no rashes  or lesions or ulcers, no induration or nodules    Data reviewed:  I have personally reviewed following labs and imaging studies Labs:  CBC: Recent Labs  Lab 05/30/17 1330 05/31/17 0404 06/01/17 0342  WBC 7.8 6.3 10.3  NEUTROABS 6.5  --   --   HGB 14.3 11.9* 12.6*  HCT 42.1 36.2* 39.9  MCV 81.4 82.3 84.7  PLT 271 242 456    Basic Metabolic Panel: Recent Labs  Lab 05/30/17 1330 05/31/17 0404 06/01/17 0342  NA 140 140 143  K 3.9 3.9 3.8  CL 95* 101 100*  CO2 34* 28 30    GLUCOSE 240* 142* 143*  BUN 17 22* 20  CREATININE 1.45* 1.48* 1.37*  CALCIUM 10.2 9.3 9.0  MG  --  1.4*  --   PHOS  --  2.3*  --    GFR Estimated Creatinine Clearance: 48.8 mL/min (A) (by C-G formula based on SCr of 1.37 mg/dL (H)). Liver Function Tests: Recent Labs  Lab 05/30/17 1330  AST 61*  ALT 43  ALKPHOS 86  BILITOT 0.6  PROT 8.0  ALBUMIN 4.3   No results for input(s): LIPASE, AMYLASE in the last 168 hours. No results for input(s): AMMONIA in the last 168 hours. Coagulation profile No results for input(s): INR, PROTIME in the last 168 hours.  Cardiac Enzymes: No results for input(s): CKTOTAL, CKMB, CKMBINDEX, TROPONINI in the last 168 hours. BNP: Invalid input(s): POCBNP CBG: Recent Labs  Lab 06/04/17 0406 06/04/17 0806 06/04/17 1144 06/04/17 1551 06/04/17 1941  GLUCAP 89 135* 124* 108* 136*   D-Dimer No results for input(s): DDIMER in the last 72 hours. Hgb A1c No results for input(s): HGBA1C in the last 72 hours. Lipid Profile No results for input(s): CHOL, HDL, LDLCALC, TRIG, CHOLHDL, LDLDIRECT in the last 72 hours. Thyroid function studies No results for input(s): TSH, T4TOTAL, T3FREE, THYROIDAB in the last 72 hours.  Invalid input(s): FREET3 Anemia work up No results for input(s): VITAMINB12, FOLATE, FERRITIN, TIBC, IRON, RETICCTPCT in the last 72 hours. Urinalysis    Component Value Date/Time   COLORURINE YELLOW 05/30/2017 1330   APPEARANCEUR CLEAR 05/30/2017 1330   LABSPEC 1.020 05/30/2017 1330   PHURINE 5.0 05/30/2017 1330   GLUCOSEU >=500 (A) 05/30/2017 1330   HGBUR MODERATE (A) 05/30/2017 1330   BILIRUBINUR NEGATIVE 05/30/2017 1330   KETONESUR 5 (A) 05/30/2017 1330   PROTEINUR >=300 (A) 05/30/2017 1330   NITRITE NEGATIVE 05/30/2017 1330   LEUKOCYTESUR TRACE (A) 05/30/2017 1330     Microbiology Recent Results (from the past 240 hour(s))  MRSA PCR Screening     Status: None   Collection Time: 05/30/17  7:09 PM  Result Value Ref  Range Status   MRSA by PCR NEGATIVE NEGATIVE Final    Comment:        The GeneXpert MRSA Assay (FDA approved for NASAL specimens only), is one component of a comprehensive MRSA colonization surveillance program. It is not intended to diagnose MRSA infection nor to guide or monitor treatment for MRSA infections. Performed at [facility], [place] [address], [place], [place] [address]        Inpatient Medications:   Scheduled Meds: . arformoterol  15 mcg Nebulization BID  . budesonide (PULMICORT) nebulizer solution  0.5 mg Nebulization BID  . chlorhexidine gluconate (MEDLINE KIT)  15 mL Mouth Rinse BID  . insulin aspart  0-15 Units Subcutaneous Q4H  . mouth rinse  15 mL Mouth Rinse QID  . pantoprazole (PROTONIX)  IV  40 mg Intravenous Q24H  . vitamin A & D       Continuous Infusions: . dextrose 5 % and 0.45% NaCl    . sodium chloride       Radiological Exams on Admission: No results found.  Impression/Recommendations Principal Problem:   Acute respiratory failure with hypoxia (HCC) Active Problems:   Status post tracheostomy ([place])   Malnutrition of moderate degree   Laryngeal carcinoma (HCC)   DM2 (diabetes mellitus, type 2) (HCC)   HTN (hypertension)  1. Acute resp failure with hypoxia - secondary to laryngeal carcinoma, squamous cell. 1. S/P emergency trach 2. Off vent, talking with Passey-Muir 3. Failed initial swallow screen 1. Discussed PEG with patient: 2. Will get second swallow screen to see if still aspirating / unable to tolerate POs (patient really thinks he can) 3. If fails that, then patient consents to PEG. 2. Laryngeal carcinoma - 1. Per oncs note - 1. They request GI consult for EGD to r/o second malignancy. 2. Defer placement of peg until EGD done 3. They would prefer patient be transferred to [place] in [place] where they are already seeing patient for laryngeal carcinoma. 2. [facility], doesn't  sound like they've been contacted yet from my understanding. 3. Has fentanyl PRN written for 3. Malnutrition - 1. Repeating CBC, CMP, Mg since no labs for past couple of days 2. Will switch to D5 half to give him some source of glucose (although minimal) while nutrition is being sorted as above. 4. DM2 - 1. Mod scale SSI Q4H 2. Checking labs as above 5. HTN - 1. Will add PRN labetalol for SBP > 180 if needed   Thank you for this consultation.  Our [facility] hospitalist team will follow the patient with you.    GARDNER, JARED M. D.O. [facility] 06/04/2017, 8:27 PM

## 2017-06-04 NOTE — Progress Notes (Signed)
  Speech Language Pathology Treatment: [name] Speaking valve  Patient Details Name: Vincent Hansen MRN: [id number] DOB: Jun 20, 1945 Today's Date: 06/04/2017 Time: 4196-2229 SLP Time Calculation (min) (ACUTE ONLY): 20 min  Assessment / Plan / Recommendation Clinical Impression  Pt with good tolerance of PMSV today after he cleared secretions through trach intially via coughing. Viscous whitish secretions expectorated.  Pt has trach cuff deflated upon SLP entrance to room fortunately, however today he was unable to voice without PMSV.  SLP demonstrated donning/removing valve to pt x2 and he returned demonstration x3 with moderate tactile cues.  Pt has large hands which impacting his pmsv manipulation. Recommend continue pmsv with full supervision - pt will continue to be instructed on removal/placement of valve but will need more practice for independent use.   Pt complaining about lack of po intake stating "I'm hungry".  Advised him to results of MBS and aspiration of two tsps of liquids provided and gross pharyngeal retention - thus his dysphagia prohibits him from being able to consume po. Advised pt to continue to swish and expectorate with water to aid comfort and this allows minimal swallowing.  SlP provided pt with water - which he demonstrated swish and expectoration - followed immediately by cough - suspect aspiration.    Will follow up for PMSV tolerance - education.   Note GI referral possible - ? Plan for nutrition for this pt who had been npo since admit.     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  Pt is s/p emergent trach and order for swallow/PMSV evaluations received. CXR showed Low lung volumes with areas of scarring and/or subsegmental atelectasis in the lower lobes of the lungs bilaterally.     Pt denies h/o dysphagia and admits to dysphonia.       SLP Plan  Continue with current plan of care       Recommendations   ? Alternative means of nutrition      Patient may use Passy-Muir Speech Valve: Intermittently with supervision;During all therapies with supervision PMSV Supervision: Full MD: Please consider changing trach tube to : Cuffless         Follow up Recommendations: Other (comment)(tbd) SLP Visit Diagnosis: Aphonia (R49.1) Plan: Continue with current plan of care       [place], Keyonni Percival Ann 06/04/2017, 1:17 PM Luanna Salk, [facility] SLP [phone]

## 2017-06-04 NOTE — Progress Notes (Signed)
   Subjective:    Patient ID: Vincent Hansen, male    DOB: May 13, 1945, 72 y.o.   MRN: [id number]  HPI Doing well.  Wishes to swallow.  Concerned about ear pain and ear wax.  Review of Systems     Objective:   Physical Exam Alert, NAD #6 cuffed trach tube in place, cuff deflated, removed sutures Good voice with Passy-Muir valve in place    Assessment & Plan:  Laryngeal cancer s/p emergent tracheostomy  Removed sutures from trach tube.  Will plan to downsize trach tube to cuffless tube Monday.  Ordered maintenance IVF.  Speech pathology evaluation appreciated.  NPO for now.  He will need placement of a feeding tube.

## 2017-06-05 DIAGNOSIS — R933 Abnormal findings on diagnostic imaging of other parts of digestive tract: Secondary | ICD-10-CM

## 2017-06-05 DIAGNOSIS — R1312 Dysphagia, oropharyngeal phase: Secondary | ICD-10-CM

## 2017-06-05 LAB — BASIC METABOLIC PANEL
Anion gap: 15 (ref 5–15)
BUN: 33 mg/dL — ABNORMAL HIGH (ref 6–20)
CALCIUM: 9.9 mg/dL (ref 8.9–10.3)
CO2: 30 mmol/L (ref 22–32)
CREATININE: 1.43 mg/dL — AB (ref 0.61–1.24)
Chloride: 107 mmol/L (ref 101–111)
GFR calc non Af Amer: 48 mL/min — ABNORMAL LOW (ref >60)
GFR, EST AFRICAN AMERICAN: 55 mL/min — AB (ref >60)
Glucose, Bld: 197 mg/dL — ABNORMAL HIGH (ref 65–99)
Potassium: 5 mmol/L (ref 3.5–5.1)
SODIUM: 152 mmol/L — AB (ref 135–145)

## 2017-06-05 LAB — GLUCOSE, CAPILLARY
GLUCOSE-CAPILLARY: 170 mg/dL — AB (ref 65–99)
GLUCOSE-CAPILLARY: 166 mg/dL — AB (ref 65–99)
Glucose-Capillary: 177 mg/dL — ABNORMAL HIGH (ref 65–99)
Glucose-Capillary: 139 mg/dL — ABNORMAL HIGH (ref 65–99)
Glucose-Capillary: 190 mg/dL — ABNORMAL HIGH (ref 65–99)
Glucose-Capillary: 192 mg/dL — ABNORMAL HIGH (ref 65–99)

## 2017-06-05 NOTE — Progress Notes (Signed)
PROGRESS NOTE    Vincent Hansen  ZOX:[id number] DOB: 1946/01/03 DOA: 05/30/2017 PCP: Patient, No Pcp Per     Brief Narrative:  72 year old with past medical history relevant for hypertension, COPD, type 2 diabetes, prior history of squamous cell carcinoma of the larynx admitted with hypoxia and found to have large laryngeal mass obstructing airway status post emergency tracheostomy placement with 6 Shiley tracheostomy on 05/30/2017 status post extubation on 05/31/2017.   Assessment & Plan:   Principal Problem:   Acute respiratory failure with hypoxia (HCC) Active Problems:   Status post tracheostomy (HCC)   Malnutrition of moderate degree   Laryngeal carcinoma (HCC)   DM2 (diabetes mellitus, type 2) (HCC)   HTN (hypertension)   #) Squamous cell carcinoma of larynx status post tracheostomy: Patient is failed modified barium swallow.  He is primarily speaking using a Passy-Muir valve.  Definitive staging and treatment will be initiated once his medical course here is stabilized and he has received a PEG tube. -Appreciate ENT involvement, will defer tracheostomy management to them - Speech-language pathology -Consult interventional radiology for PEG tube placement and then will discuss with nutrition about optimal feeding regimen. -Follow-up pathology  #) Hypernatremia: Likely in the setting of insensible free water losses in the setting of not taking any p.o. -Continue half-normal saline at 125 mils an hour  #) Possible gastric cancer: Noted on CT imaging the patient has abnormal appearing gastric cardia concerning for synchronous gastric carcinoma. -Gastroenterology consult  #) Hypertension: -Continue as needed labetalol every 2 hours IV  #) Type 2 diabetes: -Continue sliding scale insulin  #) COPD: -Continue L ABA/ICS -Continue as needed bronchodilators every 4 hours as needed  Fluids: Half-normal saline for hypernatremia Electrolytes: Monitor and supplement Nutrition:  N.p.o. pending placement of PEG tube  Prophylaxis: SCDs  Disposition: Pending evaluation of possible gastric cancer and placement of feeding tube  Full code   Consultants:   ENT  Interventional radiology  Gastroenterology  Oncology  Procedures: (Don't include imaging studies which can be auto populated. Include things that cannot be auto populated i.e. Echo, Carotid and venous dopplers, Foley, Bipap, HD, tubes/drains, wound vac, central lines etc)  None  Antimicrobials: (specify start and planned stop date. Auto populated tables are space occupying and do not give end dates)  None   Subjective: Patient reports that he is doing better.  He reports that he is quite hungry however he knows he is unable to eat anything.  He denies any significant pain.  Objective: Vitals:   06/05/17 0340 06/05/17 0729 06/05/17 0800 06/05/17 0841  BP:  (!) 136/94  (!) 143/84  Pulse:  (!) 105  97  Resp:  (!) 23  19  Temp: 97.8 F (36.6 C)  (!) 97.3 F (36.3 C)   TempSrc: Oral  Oral   SpO2: 99% 96%  97%  Weight:      Height:        Intake/Output Summary (Last 24 hours) at 06/05/2017 1134 Last data filed at 06/05/2017 1100 Gross per 24 hour  Intake 1706.25 ml  Output 1025 ml  Net 681.25 ml   Filed Weights   05/30/17 2000 06/02/17 0500 06/04/17 0435  Weight: 76.5 kg (168 lb 10.4 oz) 72.2 kg (159 lb 2.8 oz) 69.8 kg (153 lb 14.1 oz)    Examination:  General exam: Appears calm and comfortable, able to speak with Passy-Muir valve in Respiratory system: Clear to auscultation. Respiratory effort normal.  Scattered rhonchi, no wheezes, trach site is clean dry  and intact Cardiovascular system: Regular rate and rhythm, no murmurs Gastrointestinal system: Abdomen is nondistended, soft and nontender. No organomegaly or masses felt. Normal bowel sounds heard. Central nervous system: Alert and oriented. No focal neurological deficits. Extremities: No lower extremity edema Skin: No rashes on  visible skin Psychiatry: Judgement and insight appear normal. Mood & affect appropriate.     Data Reviewed: I have personally reviewed following labs and imaging studies  CBC: Recent Labs  Lab 05/30/17 1330 05/31/17 0404 06/01/17 0342 06/04/17 2043  WBC 7.8 6.3 10.3 11.9*  NEUTROABS 6.5  --   --   --   HGB 14.3 11.9* 12.6* 14.8  HCT 42.1 36.2* 39.9 46.8  MCV 81.4 82.3 84.7 87.3  PLT 271 242 228 803   Basic Metabolic Panel: Recent Labs  Lab 05/30/17 1330 05/31/17 0404 06/01/17 0342 06/04/17 2043 06/05/17 0319  NA 140 140 143 152* 152*  K 3.9 3.9 3.8 5.0 5.0  CL 95* 101 100* 105 107  CO2 34* '28 30 25 30  ' GLUCOSE 240* 142* 143* 174* 197*  BUN 17 22* 20 39* 33*  CREATININE 1.45* 1.48* 1.37* 1.56* 1.43*  CALCIUM 10.2 9.3 9.0 10.2 9.9  MG  --  1.4*  --  2.0  --   PHOS  --  2.3*  --  3.7  --    GFR: Estimated Creatinine Clearance: 46.8 mL/min (A) (by C-G formula based on SCr of 1.43 mg/dL (H)). Liver Function Tests: Recent Labs  Lab 05/30/17 1330 06/04/17 2043  AST 61* 19  ALT 43 27  ALKPHOS 86 76  BILITOT 0.6 1.4*  PROT 8.0 7.7  ALBUMIN 4.3 3.7   No results for input(s): LIPASE, AMYLASE in the last 168 hours. No results for input(s): AMMONIA in the last 168 hours. Coagulation Profile: No results for input(s): INR, PROTIME in the last 168 hours. Cardiac Enzymes: No results for input(s): CKTOTAL, CKMB, CKMBINDEX, TROPONINI in the last 168 hours. BNP (last 3 results) No results for input(s): PROBNP in the last 8760 hours. HbA1C: No results for input(s): HGBA1C in the last 72 hours. CBG: Recent Labs  Lab 06/04/17 1551 06/04/17 1941 06/04/17 2331 06/05/17 0411 06/05/17 0758  GLUCAP 108* 136* 153* 170* 177*   Lipid Profile: No results for input(s): CHOL, HDL, LDLCALC, TRIG, CHOLHDL, LDLDIRECT in the last 72 hours. Thyroid Function Tests: No results for input(s): TSH, T4TOTAL, FREET4, T3FREE, THYROIDAB in the last 72 hours. Anemia Panel: No results  for input(s): VITAMINB12, FOLATE, FERRITIN, TIBC, IRON, RETICCTPCT in the last 72 hours. Sepsis Labs: Recent Labs  Lab 05/30/17 1333  LATICACIDVEN 1.11    Recent Results (from the past 240 hour(s))  MRSA PCR Screening     Status: None   Collection Time: 05/30/17  7:09 PM  Result Value Ref Range Status   MRSA by PCR NEGATIVE NEGATIVE Final    Comment:        The GeneXpert MRSA Assay (FDA approved for NASAL specimens only), is one component of a comprehensive MRSA colonization surveillance program. It is not intended to diagnose MRSA infection nor to guide or monitor treatment for MRSA infections. Performed at [facility], [place] [address] [place], [place] [address]          Radiology Studies: No results found.      Scheduled Meds: . arformoterol  15 mcg Nebulization BID  . budesonide (PULMICORT) nebulizer solution  0.5 mg Nebulization BID  . chlorhexidine gluconate (MEDLINE KIT)  15 mL Mouth  Rinse BID  . insulin aspart  0-15 Units Subcutaneous Q4H  . mouth rinse  15 mL Mouth Rinse QID  . pantoprazole (PROTONIX) IV  40 mg Intravenous Q24H   Continuous Infusions: . dextrose 5 % and 0.45% NaCl 125 mL/hr at 06/05/17 0619     LOS: 6 days    Time spent: [place], MD [facility]  If 7PM-7AM, please contact night-coverage [email or web address] Password [facility] 06/05/2017, 11:34 AM
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
## 2017-06-05 NOTE — Progress Notes (Signed)
SLP Cancellation Note  Patient Details Name: Vincent Hansen MRN: [id number] DOB: 05/31/1945   Cancelled treatment:       Reason Eval/Treat Not Completed: Other (comment). Chart reviewed as well as MBS which was completed on 06/02/17, with findings: Patient presents with gross pharyngo-cervical esophageal dysphagia with very poor motility and absent clearance of barium into esophagus. Pt is overtly aspirating barium with secretions with poor clearance despite cued coughing. Given severity and nature of dysphagia, repeat MBS not warranted as pt unlikely to have made spontaneous improvements. Continue to recommend NPO; pt likely to need long-term alternative means of nutrition.  Deneise Lever, [place], [place] Speech-Language Pathologist [phone]   [name] 06/05/2017, 8:04 AM
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
## 2017-06-05 NOTE — Progress Notes (Signed)
   Subjective:    Patient ID: Vincent Hansen, male    DOB: Apr 11, 1945, 72 y.o.   MRN: [id number]  HPI No problems overnight.  Review of Systems     Objective:   Physical Exam Alert, NAD #6 cuffed Shiley trach in place, patent, good position    Assessment & Plan:  Laryngeal cancer s/p emergent tracheostomy  Appreciate hospitalist care.  Will down-size trach tube to cuffless tube tomorrow.

## 2017-06-06 ENCOUNTER — Encounter (HOSPITAL_COMMUNITY): Payer: Self-pay

## 2017-06-06 LAB — BASIC METABOLIC PANEL WITH GFR
BUN: 18 mg/dL (ref 6–20)
Calcium: 9.2 mg/dL (ref 8.9–10.3)
Chloride: 109 mmol/L (ref 101–111)
Creatinine, Ser: 1.1 mg/dL (ref 0.61–1.24)
GFR calc non Af Amer: >60 mL/min (ref >60)

## 2017-06-06 LAB — GLUCOSE, CAPILLARY
Glucose-Capillary: 199 mg/dL — ABNORMAL HIGH (ref 65–99)
Glucose-Capillary: 160 mg/dL — ABNORMAL HIGH (ref 65–99)
Glucose-Capillary: 169 mg/dL — ABNORMAL HIGH (ref 65–99)
Glucose-Capillary: 142 mg/dL — ABNORMAL HIGH (ref 65–99)
Glucose-Capillary: 185 mg/dL — ABNORMAL HIGH (ref 65–99)

## 2017-06-06 LAB — BASIC METABOLIC PANEL
Anion gap: 9 (ref 5–15)
CO2: 30 mmol/L (ref 22–32)
GFR calc Af Amer: >60 mL/min (ref >60)
Glucose, Bld: 212 mg/dL — ABNORMAL HIGH (ref 65–99)
Potassium: 3.6 mmol/L (ref 3.5–5.1)
Sodium: 148 mmol/L — ABNORMAL HIGH (ref 135–145)

## 2017-06-06 NOTE — Progress Notes (Signed)
MD Redmond Baseman at bedside. Pt c/o 9/10 painto back and neck. Administered PRN fentanyl per order. Dr Redmond Baseman removed cuffed trach and replaced with cuffles trach and inner cannula. Changed neck tie. New obturator placed at bedside. Pt tolerated well. Will cont to mon pain and SPO2 with cont pulse ox

## 2017-06-06 NOTE — Progress Notes (Addendum)
     [facility] Progress Note  CC:  Abnormal CT scan  Subjective:  Feeling ok.  No new complaints.  Sounds like having less secretions.  Objective:  Vital signs in last 24 hours: Temp:  [97.3 F (36.3 C)-97.8 F (36.6 C)] 97.6 F (36.4 C) (03/24 0800) Pulse Rate:  [71-96] 91 (03/24 1144) Resp:  [15-27] 17 (03/24 1207) BP: (131-161)/(60-97) 138/84 (03/24 1207) SpO2:  [94 %-100 %] 97 % (03/24 1207) FiO2 (%):  [25 %-28 %] 28 % (03/24 1207) Last BM Date: 06/03/17 General:  Alert, Well-developed, in NAD; tracheostomy in place Heart:  Regular rate and rhythm; no murmurs Pulm:  Some gurgling noted from the upper airway.  Normal respiratory effort noted. Abdomen:  Soft, non-distended.  BS present.  Non-tender.   Extremities:  Without edema. Neurologic:  Alert and  oriented x4;  grossly normal neurologically. Psych:  Alert and cooperative. Normal mood and affect.  Intake/Output from previous day: 03/23 0701 - 03/24 0700 In: 3000 [I.V.:3000] Out: 485 [Urine:485] Intake/Output this shift: Total I/O In: 625 [I.V.:625] Out: -   Lab Results: Recent Labs    06/04/17 2043  WBC 11.9*  HGB 14.8  HCT 46.8  PLT 302   BMET Recent Labs    06/04/17 2043 06/05/17 0319 06/06/17 0320  NA 152* 152* 148*  K 5.0 5.0 3.6  CL 105 107 109  CO2 25 30 30   GLUCOSE 174* 197* 212*  BUN 39* 33* 18  CREATININE 1.56* 1.43* 1.10  CALCIUM 10.2 9.9 9.2   LFT Recent Labs    06/04/17 2043  PROT 7.7  ALBUMIN 3.7  AST 19  ALT 27  ALKPHOS 76  BILITOT 1.4*   Assessment / Plan: Abnormal imaging GI tract with thickening of the EG junction of unclear clinical significance. 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 Swallowing study shows aspiration, he is strictly n.p.o. and needs an alternate means of nutrition.  We are asked to perform upper endoscopy to evaluate this abnormality of the EG junction on CT scan.  His  clinicians would like to rule out a synchronous upper GI malignancy.  This is tentatively planned for 3/25 with Dr. Ardis Hughs.  Of note, anyone considering placing a gastrostomy on this patient should examine his upper abdomen because it would likely be technically challenging given the postsurgical findings on physical exam.    LOS: 7 days   Laban Emperor. Zehr  06/06/2017, 12:36 PM  Pager number [phone]  I have discussed the case with the PA, and that is the plan I formulated. I personally interviewed and examined the patient.  Clinically unchanged from yesterday. He is agreeable to EGD.  Tentatively scheduled with anesthesia support in endoscopy lab tomorrow. Patient at increased risk for cardiopulmonary complications of procedure due to medical comorbidities.  See my note from yesterday regarding his physical exam and technical consideration of gastrostomy placement.    Nelida Meuse III Pager [phone]  Mon-Fri 8a-5p [phone] after 5p, weekends, holidays

## 2017-06-06 NOTE — Progress Notes (Signed)
   Subjective:    Patient ID: Vincent Hansen, male    DOB: 1946-02-15, 72 y.o.   MRN: [id number]  HPI No complaints.  Moved to regular room.  Review of Systems     Objective:   Physical Exam AF VSS Alert, NAD #6 cuffed Shiley trach in place Rough hoarseness with Passy-Muir valve in place    Assessment & Plan:  Supraglottic laryngeal cancer s/p emergent tracheostomy  Changed trach tube to #6 cuffless Shiley without difficulty.  Stoma with some granulation and odor but healing. Appreciate hospitalist care.  Oncology and GI involved.

## 2017-06-06 NOTE — Progress Notes (Signed)
Entry due to lack of "Trach Check" on [employer]. PT currently on 28% ATC, PT has #6 Shiley cuffless trach in place, all Emergency Equipment in room at this time, PT suctioned times two which resulted in small, yellow, thick sputum- tolerated well, drain sponge replaced, trach tie clean and secure, site is dry and warm (tender to touch per PT). PT is in no distress at this time.
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
## 2017-06-06 NOTE — Plan of Care (Signed)
  Problem: Health Behavior/Discharge Planning: Goal: Ability to manage health-related needs will improve Outcome: Progressing   Problem: Pain Managment: Goal: General experience of comfort will improve Outcome: Progressing   Problem: Safety: Goal: Ability to remain free from injury will improve Outcome: Progressing   Problem: Skin Integrity: Goal: Risk for impaired skin integrity will decrease Outcome: Progressing

## 2017-06-06 NOTE — Progress Notes (Signed)
Patient arrived to unit. Trach checklist taped to [facility], all supplies in room including obturator and ambu bag. Reviewed POC with patient, educated patient that he cannot drink water and that RN will perform mouth care every hour to ensure moist oral mucosa. Patient oriented to unit, vital signs stable. RN attempted to perform skin assessment and pt refused.  Vincent Hansen. Brigitte Pulse, RN

## 2017-06-07 ENCOUNTER — Encounter (HOSPITAL_COMMUNITY)
Admission: EM | Disposition: A | Discharge status: Home Health | Payer: Self-pay | Source: Home / Self Care | Attending: Otolaryngology

## 2017-06-07 ENCOUNTER — Inpatient Hospital Stay (HOSPITAL_COMMUNITY): Payer: Non-veteran care | Admitting: Anesthesiology

## 2017-06-07 ENCOUNTER — Encounter (HOSPITAL_COMMUNITY): Payer: Self-pay | Admitting: Gastroenterology

## 2017-06-07 DIAGNOSIS — R933 Abnormal findings on diagnostic imaging of other parts of digestive tract: Secondary | ICD-10-CM

## 2017-06-07 DIAGNOSIS — K449 Diaphragmatic hernia without obstruction or gangrene: Secondary | ICD-10-CM

## 2017-06-07 HISTORY — PX: ESOPHAGOGASTRODUODENOSCOPY (EGD) WITH PROPOFOL: SHX5813

## 2017-06-07 LAB — GLUCOSE, CAPILLARY
GLUCOSE-CAPILLARY: 154 mg/dL — AB (ref 65–99)
GLUCOSE-CAPILLARY: 155 mg/dL — AB (ref 65–99)
GLUCOSE-CAPILLARY: 214 mg/dL — AB (ref 65–99)
GLUCOSE-CAPILLARY: 225 mg/dL — AB (ref 65–99)
Glucose-Capillary: 169 mg/dL — ABNORMAL HIGH (ref 65–99)
Glucose-Capillary: 139 mg/dL — ABNORMAL HIGH (ref 65–99)

## 2017-06-07 LAB — CBC
HCT: 41.4 % (ref 39.0–52.0)
Hemoglobin: 12.8 g/dL — ABNORMAL LOW (ref 13.0–17.0)
MCH: 27 pg (ref 26.0–34.0)
MCHC: 30.9 g/dL (ref 30.0–36.0)
MCV: 87.3 fL (ref 78.0–100.0)
Platelets: 240 K/uL (ref 150–400)
RBC: 4.74 MIL/uL (ref 4.22–5.81)
RDW: 13.9 % (ref 11.5–15.5)
WBC: 6.9 K/uL (ref 4.0–10.5)

## 2017-06-07 LAB — BASIC METABOLIC PANEL
BUN: 10 mg/dL (ref 6–20)
CO2: 29 mmol/L (ref 22–32)
Chloride: 108 mmol/L (ref 101–111)
Creatinine, Ser: 1.09 mg/dL (ref 0.61–1.24)
GFR calc Af Amer: >60 mL/min (ref >60)
Glucose, Bld: 180 mg/dL — ABNORMAL HIGH (ref 65–99)

## 2017-06-07 LAB — BASIC METABOLIC PANEL WITH GFR
Anion gap: 9 (ref 5–15)
Calcium: 9.1 mg/dL (ref 8.9–10.3)
GFR calc non Af Amer: >60 mL/min (ref >60)
Potassium: 3.4 mmol/L — ABNORMAL LOW (ref 3.5–5.1)
Sodium: 146 mmol/L — ABNORMAL HIGH (ref 135–145)

## 2017-06-07 LAB — MAGNESIUM: Magnesium: 1.5 mg/dL — ABNORMAL LOW (ref 1.7–2.4)

## 2017-06-07 SURGERY — ESOPHAGOGASTRODUODENOSCOPY (EGD) WITH PROPOFOL
Anesthesia: Monitor Anesthesia Care

## 2017-06-07 MED ORDER — MAGNESIUM SULFATE 4 GM/100ML IV SOLN
4.0000 g | Freq: Once | INTRAVENOUS | Status: AC
  Administered 2017-06-07: 4 g via INTRAVENOUS
  Filled 2017-06-07: qty 100

## 2017-06-07 MED ORDER — PROPOFOL 10 MG/ML IV BOLUS
INTRAVENOUS | Status: AC
  Filled 2017-06-07: qty 40

## 2017-06-07 MED ORDER — PROPOFOL 500 MG/50ML IV EMUL
INTRAVENOUS | Status: DC | PRN
  Administered 2017-06-07: 100 ug/kg/min via INTRAVENOUS

## 2017-06-07 MED ORDER — PROPOFOL 10 MG/ML IV BOLUS
INTRAVENOUS | Status: DC | PRN
  Administered 2017-06-07: 20 mg via INTRAVENOUS

## 2017-06-07 MED ORDER — LIDOCAINE 2% (20 MG/ML) 5 ML SYRINGE
INTRAMUSCULAR | Status: DC | PRN
  Administered 2017-06-07: 100 mg via INTRAVENOUS

## 2017-06-07 MED ORDER — POTASSIUM CHLORIDE 10 MEQ/100ML IV SOLN
10.0000 meq | INTRAVENOUS | Status: AC
  Administered 2017-06-07 (×4): 10 meq via INTRAVENOUS
  Filled 2017-06-07 (×4): qty 100

## 2017-06-07 MED ORDER — LACTATED RINGERS IV SOLN
INTRAVENOUS | Status: DC
Start: 2017-06-07 — End: 2017-06-07
  Administered 2017-06-07: 1000 mL via INTRAVENOUS

## 2017-06-07 SURGICAL SUPPLY — 14 items

## 2017-06-07 NOTE — Transfer of Care (Signed)
Immediate Anesthesia Transfer of Care Note  Patient: Vincent Hansen  Procedure(s) Performed: ESOPHAGOGASTRODUODENOSCOPY (EGD) WITH PROPOFOL (N/A )  Patient Location: PACU and Endoscopy Unit  Anesthesia Type:MAC  Level of Consciousness: awake and patient cooperative  Airway & Oxygen Therapy: Patient Spontanous Breathing and Patient connected to tracheostomy mask oxygen  Post-op Assessment: Report given to RN and Post -op Vital signs reviewed and stable  Post vital signs: Reviewed and stable  Last Vitals:  Vitals Value Taken Time  BP    Temp    Pulse 95 06/07/2017  1:15 PM  Resp 23 06/07/2017  1:15 PM  SpO2 92 % 06/07/2017  1:15 PM  Vitals shown include unvalidated device data.  Last Pain:  Vitals:   06/07/17 1222  TempSrc: Oral  PainSc: 4       Patients Stated Pain Goal: 2 (30/16/01 0932)  Complications: No apparent anesthesia complications

## 2017-06-07 NOTE — Anesthesia Postprocedure Evaluation (Signed)
Anesthesia Post Note  Patient: Vincent Hansen  Procedure(s) Performed: ESOPHAGOGASTRODUODENOSCOPY (EGD) WITH PROPOFOL (N/A )     Patient location during evaluation: PACU Anesthesia Type: MAC Level of consciousness: awake and alert Pain management: pain level controlled Vital Signs Assessment: post-procedure vital signs reviewed and stable Respiratory status: spontaneous breathing, nonlabored ventilation and respiratory function stable Cardiovascular status: stable and blood pressure returned to baseline Postop Assessment: no apparent nausea or vomiting Anesthetic complications: no    Last Vitals:  Vitals:   06/07/17 1222 06/07/17 1316  BP: 140/81 128/71  Pulse: 77 95  Resp: 17 (!) 23  Temp: 36.8 C 36.7 C  SpO2: 96% 92%    Last Pain:  Vitals:   06/07/17 1316  TempSrc: Oral  PainSc: 0-No pain                 Lynda Rainwater
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
## 2017-06-07 NOTE — Anesthesia Preprocedure Evaluation (Signed)
Anesthesia Evaluation  Patient identified by MRN, date of birth, ID band Patient awake and Patient confused    Reviewed: Allergy & Precautions, NPO status , Patient's Chart, lab work & pertinent test results  Airway Mallampati: II  TM Distance: >3 FB     Dental  (+) Dental Advisory Given   Pulmonary neg pulmonary ROS, former smoker,  Subglottic mass- airway distress    + decreased breath sounds      Cardiovascular hypertension, negative cardio ROS   Rhythm:Regular Rate:Tachycardia     Neuro/Psych negative neurological ROS  negative psych ROS   GI/Hepatic negative GI ROS, Neg liver ROS,   Endo/Other  negative endocrine ROSdiabetes  Renal/GU negative Renal ROS  negative genitourinary   Musculoskeletal negative musculoskeletal ROS (+)   Abdominal   Peds negative pediatric ROS (+)  Hematology negative hematology ROS (+)   Anesthesia Other Findings   Reproductive/Obstetrics negative OB ROS                             Anesthesia Physical  Anesthesia Plan  ASA: IV and emergent  Anesthesia Plan: MAC   Post-op Pain Management:    Induction: Intravenous  PONV Risk Score and Plan: 1 and Ondansetron and Treatment may vary due to age or medical condition  Airway Management Planned:   Additional Equipment:   Intra-op Plan:   Post-operative Plan:   Informed Consent: I have reviewed the patients History and Physical, chart, labs and discussed the procedure including the risks, benefits and alternatives for the proposed anesthesia with the patient or authorized representative who has indicated his/her understanding and acceptance.   Dental advisory given  Plan Discussed with: CRNA  Anesthesia Plan Comments:         Anesthesia Quick Evaluation

## 2017-06-07 NOTE — Anesthesia Procedure Notes (Signed)
Procedure Name: MAC Date/Time: 06/07/2017 12:52 PM Performed by: Dione Booze, CRNA Pre-anesthesia Checklist: Patient being monitored, Suction available, Patient identified and Emergency Drugs available Patient Re-evaluated:Patient Re-evaluated prior to induction Placement Confirmation: positive ETCO2 Comments: Trach mask

## 2017-06-07 NOTE — Progress Notes (Signed)
SLP Cancellation Note  Patient Details Name: Vincent Hansen MRN: [id number] DOB: 08-30-1945   Cancelled treatment:       Reason Eval/Treat Not Completed: Other (comment);Patient at procedure or test/unavailable(pt at endo at this time, will continue efforts)   Vincent Hansen 06/07/2017, 12:47 PM

## 2017-06-07 NOTE — Plan of Care (Signed)
  Problem: Health Behavior/Discharge Planning: Goal: Ability to manage health-related needs will improve Outcome: Progressing   Problem: Elimination: Goal: Will not experience complications related to urinary retention Outcome: Progressing   Problem: Safety: Goal: Ability to remain free from injury will improve Outcome: Progressing   

## 2017-06-07 NOTE — Op Note (Signed)
[facility] Patient Name: Vincent Hansen Procedure Date: 06/07/2017 MRN: [id number] Attending MD: Milus Banister , MD Date of Birth: 1945/09/25 CSN: [id number] Age: 72 Admit Type: Inpatient Procedure:                Upper GI endoscopy Indications:              Emergent trach last week due to supraglottic                            laryneal cancer (ENT); staging workup suggested                            abnormal GE junction, proximal stomach Providers:                Milus Banister, MD, Cleda Daub, RN, Charolette Child, Technician, Dione Booze, CRNA Referring MD:              Medicines:                Monitored Anesthesia Care Complications:            No immediate complications. Estimated blood loss:                            None. Estimated Blood Loss:     Estimated blood loss: none. Procedure:                Pre-Anesthesia Assessment:                           - Prior to the procedure, a History and Physical                            was performed, and patient medications and                            allergies were reviewed. The patient's tolerance of                            previous anesthesia was also reviewed. The risks                            and benefits of the procedure and the sedation                            options and risks were discussed with the patient.                            All questions were answered, and informed consent                            was obtained. Prior Anticoagulants: The patient has  taken no previous anticoagulant or antiplatelet                            agents. ASA Grade Assessment: IV - A patient with                            severe systemic disease that is a constant threat                            to life. After reviewing the risks and benefits,                            the patient was deemed in satisfactory condition to         undergo the procedure.                           After obtaining informed consent, the endoscope was                            passed under direct vision. Throughout the                            procedure, the patient's blood pressure, pulse, and                            oxygen saturations were monitored continuously. The                            Endoscope was introduced through the mouth, and                            advanced to the second part of duodenum. The upper                            GI endoscopy was accomplished without difficulty.                            The patient tolerated the procedure well. Scope In: Scope Out: Findings:      An ulcerated mass was noted in supraglottis (see images)      The esophagus was normal.      A medium-sized hiatal hernia was present.      The examined duodenum was normal. Impression:               - The abnormal GE junction, proximal stomach                            described on CT is related to anatomic disortion                            caused by his medium sized hiatal hernia. There is                            no GE junction, gastric  cancer. Moderate Sedation:      N/A- Per Anesthesia Care Recommendation:           - REturn to hospital for ongoing care.                           - Please call, page with any further questions or                            concerns. Procedure Code(s):        --- Professional ---                           [phone], Esophagogastroduodenoscopy, flexible,                            transoral; diagnostic, including collection of                            specimen(s) by brushing or washing, when performed                            (separate procedure) Diagnosis Code(s):        --- Professional ---                           K44.9, Diaphragmatic hernia without obstruction or                            gangrene                           R93.3, Abnormal findings on diagnostic imaging of                             other parts of digestive tract CPT copyright 2016 [employer]. All rights reserved. The codes documented in this report are preliminary and upon coder review may  be revised to meet current compliance requirements. Milus Banister, MD 06/07/2017 1:14:01 PM This report has been signed electronically. Number of Addenda: 0

## 2017-06-07 NOTE — Interval H&P Note (Signed)
History and Physical Interval Note:  06/07/2017 12:16 PM  Vincent Hansen  has presented today for surgery, with the diagnosis of Abnormal CT scan showing possible gastric mass  The various methods of treatment have been discussed with the patient and family. After consideration of risks, benefits and other options for treatment, the patient has consented to  Procedure(s): ESOPHAGOGASTRODUODENOSCOPY (EGD) WITH PROPOFOL (N/A) as a surgical intervention .  The patient's history has been reviewed, patient examined, no change in status, stable for surgery.  I have reviewed the patient's chart and labs.  Questions were answered to the patient's satisfaction.     Milus Banister

## 2017-06-08 ENCOUNTER — Inpatient Hospital Stay (HOSPITAL_COMMUNITY): Payer: Non-veteran care

## 2017-06-08 ENCOUNTER — Encounter (HOSPITAL_COMMUNITY): Payer: Self-pay | Admitting: Gastroenterology

## 2017-06-08 DIAGNOSIS — D020 Carcinoma in situ of larynx: Secondary | ICD-10-CM

## 2017-06-08 LAB — BASIC METABOLIC PANEL
ANION GAP: 6 (ref 5–15)
BUN: 8 mg/dL (ref 6–20)
CALCIUM: 8.9 mg/dL (ref 8.9–10.3)
CO2: 30 mmol/L (ref 22–32)
CREATININE: 0.93 mg/dL (ref 0.61–1.24)
Chloride: 109 mmol/L (ref 101–111)
Glucose, Bld: 161 mg/dL — ABNORMAL HIGH (ref 65–99)
Potassium: 3.9 mmol/L (ref 3.5–5.1)
SODIUM: 145 mmol/L (ref 135–145)

## 2017-06-08 LAB — GLUCOSE, CAPILLARY
GLUCOSE-CAPILLARY: 153 mg/dL — AB (ref 65–99)
Glucose-Capillary: 163 mg/dL — ABNORMAL HIGH (ref 65–99)
Glucose-Capillary: 128 mg/dL — ABNORMAL HIGH (ref 65–99)
Glucose-Capillary: 167 mg/dL — ABNORMAL HIGH (ref 65–99)
Glucose-Capillary: 145 mg/dL — ABNORMAL HIGH (ref 65–99)
Glucose-Capillary: 120 mg/dL — ABNORMAL HIGH (ref 65–99)

## 2017-06-08 LAB — MAGNESIUM: Magnesium: 1.8 mg/dL (ref 1.7–2.4)

## 2017-06-08 MED ORDER — IOPAMIDOL (ISOVUE-300) INJECTION 61%
INTRAVENOUS | Status: AC
  Administered 2017-06-08: 75 mL
  Filled 2017-06-08: qty 75

## 2017-06-08 MED ORDER — CEFAZOLIN SODIUM-DEXTROSE 2-4 GM/100ML-% IV SOLN
2.0000 g | INTRAVENOUS | Status: AC
  Administered 2017-06-09: 2 g via INTRAVENOUS

## 2017-06-08 NOTE — Progress Notes (Signed)
PROGRESS NOTE    Vincent Hansen  XKG:[id number] DOB: [birth year] DOA: 05/30/2017 PCP: Patient, No Pcp Per     Brief Narrative:  72 year old with past medical history relevant for hypertension, COPD, type 2 diabetes, prior history of squamous cell carcinoma of the larynx admitted with hypoxia and found to have large laryngeal mass obstructing airway status post emergency tracheostomy placement with 6 Shiley tracheostomy on 05/30/2017 status post extubation on 05/31/2017.  He is currently pending placement of a PEG tube for nutrition.   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  Definitive staging and treatment will be initiated once his medical course here is stabilized and he has received a PEG tube. -Appreciate ENT involvement, will defer tracheostomy management to them, currently has a 6 Shiley cuff less tracheostomy - Speech-language pathology following -Consult interventional radiology for PEG tube  -Follow-up pathology  #) Hypernatremia: Resolved with half-normal saline -Continue half-normal saline at 125 mils an hour -Will transition to diet via PEG tube once PEG tube is placed  #) Abnormal CT: On admission CT of the abdomen showed concern for possible gastric adenocarcinoma versus hiatal hernia. -Gastroenterology consult performed EGD on 06/07/2017 and showed no evidence of gastric adenocarcinoma  #) Hypertension: -Continue as needed labetalol every 2 hours IV  #) Type 2 diabetes: -Continue sliding scale insulin  #) COPD: -Continue L ABA/ICS -Continue as needed bronchodilators every 4 hours as  needed  Fluids: Half-normal saline for hypernatremia Electrolytes: Monitor and supplement Nutrition: N.p.o. due to recent tracheostomy and significant dysphasia  Prophylaxis: SCDs  Disposition: Pending evaluation of possible gastric cancer and placement of feeding tube  Full code   Consultants:   ENT  Interventional radiology  Gastroenterology  Oncology  Procedures: (Don't include imaging studies which can be auto populated. Include things that cannot be auto populated i.e. Echo, Carotid and venous dopplers, Foley, Bipap, HD, tubes/drains, wound vac, central lines etc)  None  Antimicrobials: (specify start and planned stop date. Auto populated tables are space occupying and do not give end dates)  None   Subjective: Patient reports that he is doing well.  He does not have any complaints today.   Objective: Vitals:   06/07/17 2037 06/08/17 0011 06/08/17 0412 06/08/17 0939  BP:   133/76   Pulse: 80 88 86   Resp: (!) _0 Temp:   98.2 F (36.8 C)   TempSrc:   Oral   SpO2: 98% 99% 95% 96%  Weight:      Height:        Intake/Output Summary (Last 24 hours) at 06/08/2017 1158 Last data filed at 06/08/2017 0600 Gross per 24 hour  Intake 3200 ml  Output 200 ml  Net 3000 ml   Filed Weights   05/30/17 2000 06/02/17 0500 06/04/17 0435  Weight: 76.5 kg (168 lb 10.4 oz) 72.2 kg (159 lb 2.8 oz) 69.8 kg (153 lb 14.1 oz)    Examination:  General exam: Appears calm and comfortable, able to speak with Passy-Muir valve in Respiratory system: Clear to auscultation. Respiratory effort normal.  Scattered rhonchi, no wheezes, trach  site is clean dry and intact Cardiovascular system: Regular rate and rhythm, no murmurs Gastrointestinal system: Abdomen is nondistended, soft and nontender. No organomegaly or masses felt. Normal bowel sounds heard. Central nervous system: Alert and oriented. No focal neurological deficits. Extremities: No lower extremity edema Skin: Large  well-healed midline incision Psychiatry: Judgement and insight appear normal. Mood & affect appropriate.     Data Reviewed: I have personally reviewed following labs and imaging studies  CBC: Recent Labs  Lab 06/04/17 2043 06/07/17 0501  WBC 11.9* 6.9  HGB 14.8 12.8*  HCT 46.8 41.4  MCV 87.3 87.3  PLT 302 505   Basic Metabolic Panel: Recent Labs  Lab 06/04/17 2043 06/05/17 0319 06/06/17 0320 06/07/17 0501 06/08/17 0556  NA 152* 152* 148* 146* 145  K 5.0 5.0 3.6 3.4* 3.9  CL 105 107 109 108 109  CO2 _0 GLUCOSE 174* 197* 212* 180* 161*  BUN 39* 33* _1 CREATININE 1.56* 1.43* 1.10 1.09 0.93  CALCIUM 10.2 9.9 9.2 9.1 8.9  MG 2.0  --   --  1.5* 1.8  PHOS 3.7  --   --   --   --    GFR: Estimated Creatinine Clearance: 71.9 mL/min (by C-G formula based on SCr of 0.93 mg/dL). Liver Function Tests: Recent Labs  Lab 06/04/17 2043  AST 19  ALT 27  ALKPHOS 76  BILITOT 1.4*  PROT 7.7  ALBUMIN 3.7   No results for input(s): LIPASE, AMYLASE in the last 168 hours. No results for input(s): AMMONIA in the last 168 hours. Coagulation Profile: No results for input(s): INR, PROTIME in the last 168 hours. Cardiac Enzymes: No results for input(s): CKTOTAL, CKMB, CKMBINDEX, TROPONINI in the last 168 hours. BNP (last 3 results) No results for input(s): PROBNP in the last 8760 hours. HbA1C: No results for input(s): HGBA1C in the last 72 hours. CBG: Recent Labs  Lab 06/07/17 2006 06/08/17 0021 06/08/17 0410 06/08/17 0731 06/08/17 1147  GLUCAP 214* 153* 163* 128* 167*   Lipid Profile: No results for input(s): CHOL, HDL, LDLCALC, TRIG, CHOLHDL, LDLDIRECT in the last 72 hours. Thyroid Function Tests: No results for input(s): TSH, T4TOTAL, FREET4, T3FREE, THYROIDAB in the last 72 hours. Anemia Panel: No results for input(s): VITAMINB12, FOLATE, FERRITIN, TIBC, IRON, RETICCTPCT in the last 72 hours. Sepsis Labs: No results for input(s): PROCALCITON,  LATICACIDVEN in the last 168 hours.  Recent Results (from the past 240 hour(s))  MRSA PCR Screening     Status: None   Collection Time: 05/30/17  7:09 PM  Result Value Ref Range Status   MRSA by PCR NEGATIVE NEGATIVE Final    Comment:        The GeneXpert MRSA Assay (FDA approved for NASAL specimens only), is one component of a comprehensive MRSA colonization surveillance program. It is not intended to diagnose MRSA infection nor to guide or monitor treatment for MRSA infections. Performed at [facility], [place] [address], [place], [place] [address]          Radiology Studies: No results found.      Scheduled Meds: . arformoterol  15 mcg Nebulization BID  . budesonide (PULMICORT) nebulizer solution  0.5 mg Nebulization BID  . chlorhexidine gluconate (MEDLINE KIT)  15 mL Mouth Rinse BID  . insulin aspart  0-15 Units Subcutaneous Q4H  . mouth rinse  15 mL Mouth Rinse QID  . pantoprazole (PROTONIX) IV  40 mg Intravenous Q24H  Continuous Infusions: . dextrose 5 % and 0.45% NaCl 125 mL/hr at 06/08/17 0718     LOS: 9 days    Time spent: Farley, MD [facility]  If 7PM-7AM, please contact night-coverage [email or web address] Password [facility] 06/08/2017, 11:58 AM

## 2017-06-08 NOTE — Plan of Care (Signed)
  Problem: Health Behavior/Discharge Planning: Goal: Ability to manage health-related needs will improve Outcome: Progressing   Problem: Elimination: Goal: Will not experience complications related to urinary retention Outcome: Progressing   Problem: Safety: Goal: Ability to remain free from injury will improve Outcome: Progressing   Problem: Skin Integrity: Goal: Risk for impaired skin integrity will decrease Outcome: Progressing   

## 2017-06-08 NOTE — Progress Notes (Signed)
PT Cancellation Note  Patient Details Name: Vincent Hansen MRN: [id number] DOB: Aug 09, 1945   Cancelled Treatment:    Reason Eval/Treat Not Completed: Patient at procedure or test/unavailable. Speech pathology with patient. RN reports that patient is mobilizing with supervision. Will check back another time.    Claretha Cooper 06/08/2017, 5:14 PM Tresa Endo PT [phone]

## 2017-06-08 NOTE — Consult Note (Signed)
IP PROGRESS NOTE  Subjective:  Patient reports gradual improvement of symptoms including improved breathing comfort since last evaluation.  In the interim, patient underwent EGD evaluation yesterday revealing no evidence of gastroesophageal or gastric cancer or ulceration.  Patient does have a hiatal hernia though.  Patient denies hemoptysis, no abdominal pain at this time.  Objective: Vital signs in last 24 hours: Blood pressure 134/78, pulse 82, temperature 97.8 F (36.6 C), temperature source Oral, resp. rate 20, height 6' (1.829 m), weight 153 lb 14.1 oz (69.8 kg), SpO2 96 %.  Intake/Output from previous day: 03/25 0701 - 03/26 0700 In: 3200 [I.V.:3200] Out: 200 [Urine:200]  Physical Exam: Patient appears to be alert, awake, oriented x3. HEENT: Tracheostomy in place with some thick mucus production.  No bleeding.  Palpation of the neck does not reveal distinct mass, but some asymmetry and irregular shape of the larynx is noted. Lungs: Noisy breath sounds owing to presence of tracheostomy. Cardiac: S1/S2, regular, no murmurs. Abdomen: Soft, nontender, nondistended. Lymph nodes: No palpable lymphadenopathy in the neck, supraclavicular areas, axillary, or inguinal regions. Neurologic: No gross focal neurological deficits Mentation: Patient does not appear to have any significant signs of internal stimuli response.  Answers questions appropriately and asks appropriate questions in the return Skin: Dry, thin, no rash or petechiae. Musculoskeletal: No lower extremity edema.    Lab Results: Recent Labs    06/07/17 0501  WBC 6.9  HGB 12.8*  HCT 41.4  PLT 240    BMET Recent Labs    06/07/17 0501 06/08/17 0556  NA 146* 145  K 3.4* 3.9  CL 108 109  CO2 29 30  GLUCOSE 180* 161*  BUN 10 8  CREATININE 1.09 0.93  CALCIUM 9.1 8.9    No results found for: CEA1  Studies/Results: No results found.  Medications: I have reviewed the patient's current  medications.  Assessment/Plan: 72 y.o. 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  Abnormal swallowing making nutrition resumption difficult despite patient's desire to eat.  Case discussed with the tumor board.  In the interim, mentation of the patient has returned back to baseline.  Patient has been reevaluated by Dr. Constance Holster and is advised to proceed with total laryngectomy.  Previously, the same was advised by [facility] in [place], but patient did not follow-up with them.  At this time, transfer to facility is not desired by the patient who would prefer to receive his care locally.    Recommendations: -I will defer to Dr. Constance Holster regarding the surgical management of the patient. -Agree with plans of repeating CT scan of the neck - Based on the previous appearance of the tumor, patient most likely will require adjuvant systemic therapy and radiation which will be arranged after the surgery recovery.    LOS: 9 days   Ardath Sax, MD   06/08/2017, 12:46 PM

## 2017-06-08 NOTE — Progress Notes (Signed)
  Speech Language Pathology Treatment: Vincent Hansen Speaking valve  Patient Details Name: Vincent Hansen MRN: [id number] DOB: 1946-02-22 Today's Date: 06/08/2017 Time: 3500-9381 SLP Time Calculation (min) (ACUTE ONLY): 35 min  Assessment / Plan / Recommendation Clinical Impression  Pt tolerating PMSV well and able to verbalize precautions of not using when sleeping or receiving breathing tx with Mod I.  He is able to phonate around trach tube but phonatory strength much improved with valve in place.  Pt continuously is aspirating secretions c/b his wet gurgly vocal quality and cough/expectoration of viscous - yellow tinged secretions via oral cavity with valve in place.  Pt did not allow SLP to remove valve prior to leaving room and he assured this SLP he would NOT remove it on his own.  Note call from Dr Janeice Robinson office with concern regarding trach placement - strictly advised pt NOT to manipulate his trach tube.  Assured call bell left within pt's reach and door left open to allow him to seek assistance as needed.     Thankful pt will receive a feeding tube tomorrow to provide him nutrition as he continues to grossly aspirate secretions and has severe dysphagia.  Recommend he continue to be allowed to swish and expectorate with water for oral care/moisture/comfort.    Initiated information re: laryngectomy and provided pt with portion of book entitled *Self Help for the Laryngectomee" in addition to [place].  Advised him if he has family/friends with internet assess to help him to look at some information regarding laryngectomy.   Pt told SLP that his MD is hopeful to help pt to be able to "talk to where people can understand him".       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  Pt is s/p emergent trach and order for swallow/PMSV evaluations received.  CXR showed Low lung volumes with areas of scarring and/or subsegmental atelectasis in the lower lobes of the lungs bilaterally.     Pt denies h/o dysphagia and admits to dysphonia.       SLP Plan  Continue with current plan of care       Recommendations  Diet recommendations: NPO      PMSV Supervision: Intermittent         Follow up Recommendations: (tbd) SLP Visit Diagnosis: Aphonia (R49.1) Plan: Continue with current plan of care       GO               Vincent Hansen, [facility] SLP [phone]  Vincent Hansen 06/08/2017, 6:31 PM

## 2017-06-08 NOTE — Progress Notes (Signed)
Patient ID: Vincent Hansen, male   DOB: 03/02/1946, 72 y.o.   MRN: [id number]  Seen on rounds again today.  He is breathing very well with the tracheostomy.  With the Passy-Muir valve he is phonating very nicely as well.  He still has copious secretions.  I discussed all of this with the patient. He needs a total laryngectomy. I offered him to be transferred to the [facility] but he would prefer to remain in [place] and have his surgery here. I will have dental evaluation and will order a new CT of the neck to further stage the tumor since the original CT had bad motion artifact.   The gastric mass workup with EGD was negative for a neoplasm.  I will try to schedule surgery in the next week or so as scheduling permits.
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
## 2017-06-09 ENCOUNTER — Encounter: Payer: Self-pay | Admitting: *Deleted

## 2017-06-09 ENCOUNTER — Encounter (HOSPITAL_COMMUNITY): Payer: Self-pay | Admitting: Dentistry

## 2017-06-09 ENCOUNTER — Inpatient Hospital Stay (HOSPITAL_COMMUNITY): Payer: Non-veteran care

## 2017-06-09 DIAGNOSIS — C329 Malignant neoplasm of larynx, unspecified: Secondary | ICD-10-CM

## 2017-06-09 DIAGNOSIS — Z930 Tracheostomy status: Secondary | ICD-10-CM

## 2017-06-09 HISTORY — PX: IR GASTROSTOMY TUBE MOD SED: IMG625

## 2017-06-09 LAB — CBC WITH DIFFERENTIAL/PLATELET
BASOS ABS: 0 10*3/uL (ref 0.0–0.1)
Basophils Relative: 0 %
EOS PCT: 2 %
Eosinophils Absolute: 0.1 10*3/uL (ref 0.0–0.7)
HCT: 39.2 % (ref 39.0–52.0)
HEMOGLOBIN: 12.3 g/dL — AB (ref 13.0–17.0)
LYMPHS PCT: 7 %
Lymphs Abs: 0.5 10*3/uL — ABNORMAL LOW (ref 0.7–4.0)
MCH: 26.9 pg (ref 26.0–34.0)
MCHC: 31.4 g/dL (ref 30.0–36.0)
MCV: 85.6 fL (ref 78.0–100.0)
Monocytes Absolute: 0.5 10*3/uL (ref 0.1–1.0)
Monocytes Relative: 7 %
NEUTROS ABS: 6 10*3/uL (ref 1.7–7.7)
NEUTROS PCT: 84 %
PLATELETS: 251 10*3/uL (ref 150–400)
RBC: 4.58 MIL/uL (ref 4.22–5.81)
RDW: 13.5 % (ref 11.5–15.5)
WBC: 7.1 10*3/uL (ref 4.0–10.5)

## 2017-06-09 LAB — GLUCOSE, CAPILLARY
GLUCOSE-CAPILLARY: 155 mg/dL — AB (ref 65–99)
GLUCOSE-CAPILLARY: 140 mg/dL — AB (ref 65–99)
Glucose-Capillary: 153 mg/dL — ABNORMAL HIGH (ref 65–99)
Glucose-Capillary: 167 mg/dL — ABNORMAL HIGH (ref 65–99)
Glucose-Capillary: 168 mg/dL — ABNORMAL HIGH (ref 65–99)
Glucose-Capillary: 170 mg/dL — ABNORMAL HIGH (ref 65–99)

## 2017-06-09 LAB — COMPREHENSIVE METABOLIC PANEL
ALBUMIN: 2.8 g/dL — AB (ref 3.5–5.0)
ALK PHOS: 58 U/L (ref 38–126)
ALT: 19 U/L (ref 17–63)
ANION GAP: 11 (ref 5–15)
AST: 18 U/L (ref 15–41)
BUN: 5 mg/dL — ABNORMAL LOW (ref 6–20)
CALCIUM: 8.9 mg/dL (ref 8.9–10.3)
CO2: 28 mmol/L (ref 22–32)
Chloride: 104 mmol/L (ref 101–111)
Creatinine, Ser: 0.91 mg/dL (ref 0.61–1.24)
GFR calc Af Amer: >60 mL/min (ref >60)
GFR calc non Af Amer: >60 mL/min (ref >60)
GLUCOSE: 174 mg/dL — AB (ref 65–99)
POTASSIUM: 3.1 mmol/L — AB (ref 3.5–5.1)
SODIUM: 143 mmol/L (ref 135–145)
Total Bilirubin: 0.8 mg/dL (ref 0.3–1.2)
Total Protein: 6.1 g/dL — ABNORMAL LOW (ref 6.5–8.1)

## 2017-06-09 LAB — PROTIME-INR
INR: 1.14
Prothrombin Time: 14.5 seconds (ref 11.4–15.2)

## 2017-06-09 MED ORDER — CEFAZOLIN SODIUM-DEXTROSE 2-4 GM/100ML-% IV SOLN
INTRAVENOUS | Status: AC
  Administered 2017-06-09: 2 g via INTRAVENOUS
  Filled 2017-06-09: qty 100

## 2017-06-09 MED ORDER — POTASSIUM CHLORIDE 10 MEQ/100ML IV SOLN
10.0000 meq | INTRAVENOUS | Status: AC
  Administered 2017-06-09 (×4): 10 meq via INTRAVENOUS
  Filled 2017-06-09 (×2): qty 100

## 2017-06-09 MED ORDER — MIDAZOLAM HCL 2 MG/2ML IJ SOLN
INTRAMUSCULAR | Status: AC
  Filled 2017-06-09: qty 4

## 2017-06-09 MED ORDER — JEVITY 1.2 CAL PO LIQD: 1000.0000 mL | ORAL | Status: DC

## 2017-06-09 MED ORDER — GLUCAGON HCL (RDNA) 1 MG IJ SOLR
INTRAMUSCULAR | Status: AC | PRN
  Administered 2017-06-09: .5 mg via INTRAVENOUS

## 2017-06-09 MED ORDER — LIDOCAINE HCL 1 % IJ SOLN
INTRAMUSCULAR | Status: AC
  Filled 2017-06-09: qty 20

## 2017-06-09 MED ORDER — FENTANYL CITRATE (PF) 100 MCG/2ML IJ SOLN
INTRAMUSCULAR | Status: AC
  Filled 2017-06-09: qty 2

## 2017-06-09 MED ORDER — IOPAMIDOL (ISOVUE-300) INJECTION 61%
INTRAVENOUS | Status: AC
  Administered 2017-06-09: 10 mL
  Filled 2017-06-09: qty 50

## 2017-06-09 MED ORDER — JEVITY 1.5 CAL/FIBER PO LIQD
1000.0000 mL | ORAL | Status: DC
  Administered 2017-06-09: 1000 mL
  Filled 2017-06-09 (×3): qty 1000

## 2017-06-09 MED ORDER — PRO-STAT SUGAR FREE PO LIQD
30.0000 mL | Freq: Every day | ORAL | Status: DC
  Administered 2017-06-09 – 2017-07-06 (×27): 30 mL
  Filled 2017-06-09 (×26): qty 30

## 2017-06-09 MED ORDER — LIDOCAINE HCL (PF) 1 % IJ SOLN
INTRAMUSCULAR | Status: AC | PRN
  Administered 2017-06-09: 30 mL

## 2017-06-09 MED ORDER — IOPAMIDOL (ISOVUE-300) INJECTION 61%
50.0000 mL | Freq: Once | INTRAVENOUS | Status: AC | PRN
  Administered 2017-06-09: 10 mL

## 2017-06-09 MED ORDER — GLUCAGON HCL RDNA (DIAGNOSTIC) 1 MG IJ SOLR
INTRAMUSCULAR | Status: AC
  Filled 2017-06-09: qty 1

## 2017-06-09 MED ORDER — FENTANYL CITRATE (PF) 100 MCG/2ML IJ SOLN
INTRAMUSCULAR | Status: AC | PRN
  Administered 2017-06-09: 50 ug via INTRAVENOUS

## 2017-06-09 MED ORDER — ONDANSETRON HCL 4 MG/2ML IJ SOLN
INTRAMUSCULAR | Status: AC
  Filled 2017-06-09: qty 2

## 2017-06-09 MED ORDER — MIDAZOLAM HCL 2 MG/2ML IJ SOLN
INTRAMUSCULAR | Status: AC | PRN
  Administered 2017-06-09: 1 mg via INTRAVENOUS

## 2017-06-09 MED ORDER — POTASSIUM CHLORIDE 10 MEQ/100ML IV SOLN
INTRAVENOUS | Status: AC
  Administered 2017-06-09: 10 meq via INTRAVENOUS
  Filled 2017-06-09: qty 200

## 2017-06-09 NOTE — Progress Notes (Signed)
Repostioned trach. Suction catheter will now pass.

## 2017-06-09 NOTE — Progress Notes (Signed)
Respiratory Care Note: Pt seen by trach team for consult.  No education needed at this time.  All necessary equipment is at bedside.  Will continue to follow for progression.

## 2017-06-09 NOTE — Procedures (Signed)
Laryngeal ca  S/p fluoro 33fr Gtube  No comp Stable EBL 0 Full use tomorrow  Full report in pacs

## 2017-06-09 NOTE — Progress Notes (Signed)
Spoke with dental clinic. MD to come up to floor to see patient rather than patient being sent down dental clinic d/t many needs and new trach. Patient aware.

## 2017-06-09 NOTE — Progress Notes (Addendum)
Nutrition Follow-up  DOCUMENTATION CODES:   Non-severe (moderate) malnutrition in context of chronic illness  INTERVENTION:  Will monitor and provide ongoing nutrition assessments and interventions as medically appropriate based on tolerance to PEG tube feeding and estimated needs  Recommend monitoring Mg/K/Phos for refeeding risk (NPO >9 days)  RD will order following: 30 mL Prostat once/day  Jevity 1.5 @ 10 mL/hr to advance by 10 mL every 12 hours to reach goal rate of Jevity 1.5 @ 60 mL/hr. At goal rate, this regimen will provide 2260 kcal (100% needs), 107 grams of protein (100% needs), and 1094 mL free water.  * As noted in prior progress notes: Pt has hx of DM, current CBGs, and need for sliding scale Novolog. Will need to trial standard TF formula and monitor CBGs on this before being able to order specialty formula (such as Glucerna) for home use for insurance purposes.    NUTRITION DIAGNOSIS:   Moderate Malnutrition related to chronic illness(COPD, Type 2 DM) as evidenced by moderate muscle depletion, moderate fat depletion.  Ongoing   GOAL:   Patient will meet greater than or equal to 90% of their needs  Unmet  MONITOR:   TF tolerance, Labs, Weight trends, Skin  REASON FOR ASSESSMENT:   Consult Enteral/tube feeding initiation and management  ASSESSMENT:   72yo M w/ PMH including HTN, COPD and T2DM. Pt brough in by police who report that the patient called them to the home 3-4 times in the 24 hours PTA.  Each time they noted that he seemed to be responding to internal stimuli, talking to people who were not actually there and he appeared to be having hallucinations. On 2/37 the police contacted the crisis line, who did assessment and IVC'd patient for auditory and visual hallucinations, concern for safety.    3/26 Per Oncology-- EGD results showed pt has hiatal hernia, but no evidence of gastroesophageal or gastric cancer or ulceration;   3/27 Per MD  Progress notes  PEG placement completed today  Pt having trouble ventilating and transferred to ICU where it was determined that pt's tracheostomy tube had dislodged  At time of nutrition assessment visit, pt was having a new trach tube and collar placed and suctioning of lungs.   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 Part of discussion included concerns related to the length of time that pt has been NPO.  Pt has been NPO for >9 days  At risk for refeeding.  At risk for worsening malnutrition status  Will perform follow-up NFPE at next nutrition assessment visit  Patient weight has not been reassessed. Will order new weight    Medications: insulin (moderate scale), glucagon,  protonix, KCl, IV D5% with NaCL @ 125 ml/hr (510 kcal/24 hr continuous infusion)  Labs: Na 143 now wnl, K 3.1 L (decrease from previous), Mg 1.5 L, CBG's 155, 167, 140 Patient remains refeeding risk   Diet Order:  Diet NPO time specified Except for: Ice Chips  EDUCATION NEEDS:   No education needs have been identified at this time  Skin:  Skin Assessment: Skin Integrity Issues: Skin Integrity Issues:: Incisions Incisions: s/p PEG placement (abdomen)  Last BM:  PTA/unknown  Height:   Ht Readings from Last 1 Encounters:  05/30/17 6' (1.829 m)    Weight:   Wt  Readings from Last 1 Encounters:  06/04/17 153 lb 14.1 oz (69.8 kg)    Ideal Body Weight:  80.91 kg  BMI:  Body mass index is 20.87 kg/m.  Estimated Nutritional Needs:   Kcal:  2100-2300 kcal (30-33 kcal/kg rounded)  Protein:  105-115 grams (20% kcal)  Fluid:  >=2.1 L/day    [place] Intern Pager: [phone]

## 2017-06-09 NOTE — Significant Event (Signed)
Rapid Response Event Note  Overview: Time Called: 8937 Arrival Time: 1428 Event Type: Respiratory  Rapid Response called in regards to patient continuing to desat after returning from PEG placement placed in IR. Respiratory at bedside. Patient on 98% trache collar. Respiratory therapist Janace Hoard was having difficulty advancing suction catheter at times via trache. Upon arrival to bedside, patient was 83-85%. Patient utilizing accessory muscles in response to increase WOB.  Initial Focused Assessment: Neuro: Patient alert and able to follow basic commands. Patient moved all extremities with ease. Cardiac: ST, S1 and S2 heard upon auscultation. BP HTN see vitals  Pulmonary: Rhonchi breath sounds in upper lung fields bilaterally, diminished in lower bases.  bilaterally, RR 25-30. O2 Sats improved slightly after suctioning to 92% but patient continued to utilize accessory muscles to breathe. See vitals   Interventions: MD Maylene Roes notified Trx to [place] exchange  Plan of Care (if not transferred):  Event Summary:   at      at          [facility]

## 2017-06-09 NOTE — Progress Notes (Signed)
This RT was called by RN for low O2 saturations in 70s on 40% atc.  Upon arrival to bedside, audible rhonchi noted.  Pt lavaged, bagged and suctioned several times for copious amount of thick tan secretions.  RN present in room and assisted with bagging pt.  Fio2 on atc increased to 98% for now, HR98, rr26, spo2 96%.  RT will continue to monitor and assess pt and will wean O2 as tolerated.

## 2017-06-09 NOTE — Progress Notes (Signed)
Received pt back from IR O2 saturations were in the low 80s. Unable to pass suction again at this time, Had to place pt on 100% O2 and Move pt to ICU called both dr.Rosen and Dr,Yacoub. Attempted to change trach to 6 cuffed per dr Constance Holster without success. Dr Constance Holster is coming to change trach. Pt is now 99% hr 86 on NRB and 100% aerosol trach collar.

## 2017-06-09 NOTE — Progress Notes (Signed)
MD paged in regards to tube feedings, MD okay to start tube feedings today. Will continue to monitor.

## 2017-06-09 NOTE — Progress Notes (Signed)
Oncology Nurse Navigator Documentation  Visited Vincent Hansen 1464 to check on his well-being. He voiced understanding of laryngectomy next week with Dr. Constance Holster. I discussed devices available to help with speech s/p this type of surgery.  He indicated Dr. Constance Holster had explained as well. I indicated I will continue to monitor his progress, provided my contact information.  Gayleen Orem, RN, BSN Head & Neck Oncology Nurse [place] [facility] [place] [phone]

## 2017-06-09 NOTE — Progress Notes (Signed)
Unable to pass suction catheter down pts trach. Trach repositioned Called Dr Constance Holster. Pt appears to be breathing from his trach o2 saturation are 92% hr 54.

## 2017-06-09 NOTE — Progress Notes (Signed)
PROGRESS NOTE    Vincent Hansen  OZY:[id number] DOB: 03-27-1945 DOA: 05/30/2017 PCP: Patient, No Pcp Per     Brief Narrative:  Vincent Hansen is a 72 year old with past medical history relevant for hypertension, COPD, type 2 diabetes, prior history of squamous cell carcinoma of the larynx admitted with hypoxia and found to have large laryngeal mass obstructing airway status post emergency tracheostomy placement with 6 Shiley tracheostomy on 05/30/2017 status post extubation on 05/31/2017. Due to continued aspiration risk, he is kept NPO and PEG to be placed for nutrition. ENT planning for total laryngectomy next week.  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 Currently has a 6 Shiley cuff less tracheostomy. -Trach repositioned by trach team/respiratory team today  -ENT planning for total laryngectomy at [facility] 4/3 Wednesday  -Will need dental follow up for periodontal therapy prior to start of chemoradiation therapy. Recommend orthopantogram once transferred to [facility] -Oncology following for adjuvant systemic therapy and radiation post-surgery recovery  -IR for PEG placement today   Hiatal hernia -CT of the abdomen showed concern for possible gastric adenocarcinoma versus hiatal hernia -S/p EGD 06/07/2017 and showed no evidence of gastric adenocarcinoma but did show medium sized hiatal hernia   Hypokalemia -Replace IV today, trend   Hypertension -Continue as needed labetalol every 2  hours IV -BP stable this morning   Type 2 diabetes -SSI   COPD -Without exacerbation. Continue brovana, pulmicort    DVT prophylaxis: SCD Code Status: Full Family Communication: No family at bedside Disposition Plan: Pending PEG, nutrition tolerance. Planning for laryngectomy next week    Consultants:   ENT  IR  GI  Oncology  Procedures:   Emergent trach and laryngoscopy 3/17 by Dr. Constance Holster   EGD 3/25   Antimicrobials:  Anti-infectives (From admission, onward)   Start     Dose/Rate Route Frequency Ordered Stop   06/09/17 1500  ceFAZolin (ANCEF) IVPB 2g/100 mL premix     2 g 200 mL/hr over 30 Minutes Intravenous To Radiology 06/08/17 1427 06/10/17 1500       Subjective: No new complaints today.  No complaints of chest pain, shortness of breath or nausea or vomiting.  Objective: Vitals:   06/09/17 0018 06/09/17 0432 06/09/17 0744 06/09/17 1147  BP: (!) 158/88 (!) 147/69    Pulse: 90 97 77 (!) 58  Resp: '18 20 20 20  ' Temp: 98.2 F (36.8 C) (!) 97 F (36.1 C)    TempSrc: Oral Oral    SpO2: 96% 97% 91% 94%  Weight:      Height:        Intake/Output Summary (Last 24 hours) at 06/09/2017 1329 Last data filed at 06/09/2017 0200 Gross per 24 hour  Intake 2500 ml  Output 150 ml  Net 2350 ml   Filed Weights   05/30/17 2000 06/02/17 0500 06/04/17 0435  Weight: 76.5 kg (168 lb 10.4 oz) 72.2 kg (159 lb 2.8 oz) 69.8 kg (153 lb 14.1 oz)    Examination:  General exam:  Appears calm and comfortable  Respiratory system: RLL crackles, +trach in place  Cardiovascular system: S1 & S2 heard, RRR. No JVD, murmurs, rubs, gallops or clicks. No pedal edema. Gastrointestinal system: Abdomen is nondistended, soft and nontender. No organomegaly or masses felt. Normal bowel sounds heard. Central nervous system: Alert and oriented. No focal neurological deficits. Extremities: Symmetric Skin: No rashes, lesions or ulcers Psychiatry: Judgement and insight appear normal. Mood  & affect appropriate.   Data Reviewed: I have personally reviewed following labs and imaging studies  CBC: Recent Labs  Lab 06/04/17 2043 06/07/17 0501 06/09/17 0526  WBC 11.9* 6.9 7.1  NEUTROABS  --   --  6.0  HGB 14.8 12.8* 12.3*  HCT 46.8 41.4 39.2  MCV 87.3 87.3 85.6  PLT 302 240 945   Basic Metabolic Panel: Recent Labs  Lab 06/04/17 2043 06/05/17 0319 06/06/17 0320 06/07/17 0501 06/08/17 0556 06/09/17 0526  NA 152* 152* 148* 146* 145 143  K 5.0 5.0 3.6 3.4* 3.9 3.1*  CL 105 107 109 108 109 104  CO2 '25 30 30 29 30 28  ' GLUCOSE 174* 197* 212* 180* 161* 174*  BUN 39* 33* '18 10 8 ' 5*  CREATININE 1.56* 1.43* 1.10 1.09 0.93 0.91  CALCIUM 10.2 9.9 9.2 9.1 8.9 8.9  MG 2.0  --   --  1.5* 1.8  --   PHOS 3.7  --   --   --   --   --    GFR: Estimated Creatinine Clearance: 73.5 mL/min (by C-G formula based on SCr of 0.91 mg/dL). Liver Function Tests: Recent Labs  Lab 06/04/17 2043 06/09/17 0526  AST 19 18  ALT 27 19  ALKPHOS 76 58  BILITOT 1.4* 0.8  PROT 7.7 6.1*  ALBUMIN 3.7 2.8*   No results for input(s): LIPASE, AMYLASE in the last 168 hours. No results for input(s): AMMONIA in the last 168 hours. Coagulation Profile: Recent Labs  Lab 06/09/17 0526  INR 1.14   Cardiac Enzymes: No results for input(s): CKTOTAL, CKMB, CKMBINDEX, TROPONINI in the last 168 hours. BNP (last 3 results) No results for input(s): PROBNP in the last 8760 hours. HbA1C: No results for input(s): HGBA1C in the last 72 hours. CBG: Recent Labs  Lab 06/08/17 1936 06/09/17 0018 06/09/17 0432 06/09/17 0720 06/09/17 1147  GLUCAP 120* 170* 155* 167* 140*   Lipid Profile: No results for input(s): CHOL, HDL, LDLCALC, TRIG, CHOLHDL, LDLDIRECT in the last 72 hours. Thyroid Function Tests: No results for input(s): TSH, T4TOTAL, FREET4, T3FREE, THYROIDAB in the last 72 hours. Anemia Panel: No results for input(s): VITAMINB12, FOLATE, FERRITIN, TIBC, IRON, RETICCTPCT in the last 72  hours. Sepsis Labs: No results for input(s): PROCALCITON, LATICACIDVEN in the last 168 hours.  Recent Results (from the past 240 hour(s))  MRSA PCR Screening     Status: None   Collection Time: 05/30/17  7:09 PM  Result Value Ref Range Status   MRSA by PCR NEGATIVE NEGATIVE Final    Comment:        The GeneXpert MRSA Assay (FDA approved for NASAL specimens only), is one component of a comprehensive MRSA colonization surveillance program. It is not intended to diagnose MRSA infection nor to guide or monitor treatment for MRSA infections. Performed at [facility], [place] [address] [place], [place] [address]        Radiology Studies: Ct Soft Tissue Neck W Contrast  Result Date: 06/08/2017 CLINICAL DATA:  Hypoxia.  Laryngeal mass. EXAM: CT NECK WITH  CONTRAST TECHNIQUE: Multidetector CT imaging of the neck was performed using the standard protocol following the bolus administration of intravenous contrast. CONTRAST:  41m ISOVUE-300 IOPAMIDOL (ISOVUE-300) INJECTION 61% COMPARISON:  CT neck 05/30/2017 and CT chest 05/30/2017. FINDINGS: Pharynx and larynx: Image quality is poor. No pharyngeal abnormalities. Redemonstrated is a glottic and supraglottic mass, likely arising from the RIGHT vocal cords, circumferential. No arytenoid sclerosis. Suspected invasion of the paraglottic fat on the RIGHT. Possible discontinuity/invasion of the RIGHT thyroid cartilage, see series 10, image 72, and series 7, image 95. Significant narrowing of the airway. No subglottic extension. Salivary glands: No inflammation, mass, or stone. Thyroid: Normal. Lymph nodes: None enlarged or abnormal density. Vascular: Atherosclerosis. Limited intracranial: Negative. Visualized orbits: Negative. Mastoids and visualized paranasal sinuses: Layering RIGHT maxillary sinus fluid. Mastoids are clear Skeleton: Spondylosis.  No worrisome osseous findings. Upper chest: No change from priors. Other: Tracheostomy tube  is partially outside the trachea. IMPRESSION: RIGHT glottic and supraglottic mass, narrowing the airway, with suspected invasion the paraglottic fat on the RIGHT. Possible discontinuity/invasion of the RIGHT thyroid cartilage. Tracheostomy tube appears to be only partially within the trachea. A call is in to the ordering provider. Electronically Signed   By: JStaci RighterM.D.   On: 06/08/2017 16:28      Scheduled Meds: . arformoterol  15 mcg Nebulization BID  . budesonide (PULMICORT) nebulizer solution  0.5 mg Nebulization BID  . chlorhexidine gluconate (MEDLINE KIT)  15 mL Mouth Rinse BID  . insulin aspart  0-15 Units Subcutaneous Q4H  . lidocaine      . mouth rinse  15 mL Mouth Rinse QID  . pantoprazole (PROTONIX) IV  40 mg Intravenous Q24H   Continuous Infusions: .  ceFAZolin (ANCEF) IV    . dextrose 5 % and 0.45% NaCl 125 mL/hr at 06/09/17 0332     LOS: 10 days    Time spent: 30 minutes   JDessa Phi DO [facility] [email or web address] Password [facility]3/27/2019, 1:29 PM

## 2017-06-09 NOTE — Progress Notes (Signed)
SLP Cancellation Note  Patient Details Name: Vincent Hansen MRN: [id number] DOB: 1945/10/08   Cancelled treatment:       Reason Eval/Treat Not Completed: Other (comment)(pt at procedure/unavailable, note events with trach tube dislodged and trach changed )   [name] 06/09/2017, 3:59 PM  Luanna Salk, [place] [facility] SLP [phone]

## 2017-06-09 NOTE — Progress Notes (Signed)
Patient ID: Vincent Hansen, male   DOB: Aug 09, 1945, 72 y.o.   MRN: [id number] I reviewed the CT scan.  This is consistent with a T3 N0 cancer.  I have him scheduled next Wednesday for total laryngectomy at [facility].  We will arrange transfer Monday or Tuesday.  He will spend 2 or 3 days in ICU postop and then back to the general medical floor.  I would anticipate 1 week in the hospital normally, but with his social situation he may have an extended stay.  He may require nursing home care after that since he has no support system at home.

## 2017-06-09 NOTE — Progress Notes (Signed)
Name: Vincent Hansen MRN: [id number] DOB: 04/17/1945    ADMISSION DATE:  05/30/2017 CONSULTATION DATE:  05/30/2017  REFERRING MD :  Dr. Constance Holster   CHIEF COMPLAINT:  Laryngeal Mass   HISTORY OF PRESENT ILLNESS:   72 yo male former smoker brought to ER by police after being found hallucinating of people trying to harm him.  Noted to have stridor in ER.  CT neck showed supraglottic narrowing from circumferential mass.  Had emergent trach by ENT.  UDS positive for opiates.  PMHx of COPD, HTN, DM.  SUBJECTIVE:    VITAL SIGNS: BP (!) 161/110 (BP Location: Right Arm)   Pulse 82   Temp (!) 97 F (36.1 C) (Oral)   Resp (!) 28   Ht 6' (1.829 m)   Wt 153 lb 14.1 oz (69.8 kg)   SpO2 90%   BMI 20.87 kg/m   INTAKE/OUPT: I/O last 3 completed shifts: In: 9242 [I.V.:4375] Out: 150 [Urine:150]  VENT SETTINGS: FiO2 (%):  [28 %-98 %] 98 %  PHYSICAL EXAMINATION: General:  Chronically ill appearing male, moderate respiratory distress HEENT: MM pink/moist Neuro: Alert and interactive, moving all ext to command CV: s1s2 rrr, no m/r/g PULM: labored breathing prior to trach exchange GI: soft, non-tender, bsx4 active  Extremities: warm/dry, - edema  Skin: no rashes or lesions   LABS: CBC Recent Labs    06/07/17 0501 06/09/17 0526  WBC 6.9 7.1  HGB 12.8* 12.3*  HCT 41.4 39.2  PLT 240 251    Coag's Recent Labs    06/09/17 0526  INR 1.14    BMET Recent Labs    06/07/17 0501 06/08/17 0556 06/09/17 0526  NA 146* 145 143  K 3.4* 3.9 3.1*  CL 108 109 104  CO2 29 30 28   BUN 10 8 5*  CREATININE 1.09 0.93 0.91  GLUCOSE 180* 161* 174*    Electrolytes Recent Labs    06/07/17 0501 06/08/17 0556 06/09/17 0526  CALCIUM 9.1 8.9 8.9  MG 1.5* 1.8  --     Sepsis Markers No results for input(s): PROCALCITON, O2SATVEN in the last 72 hours.  Invalid input(s): LACTICACIDVEN  ABG No results for input(s): PHART, PCO2ART, PO2ART in the last 72 hours.  Liver  Enzymes Recent Labs    06/09/17 0526  AST 18  ALT 19  ALKPHOS 58  BILITOT 0.8  ALBUMIN 2.8*    Cardiac Enzymes No results for input(s): TROPONINI, PROBNP in the last 72 hours.  Glucose Recent Labs    06/08/17 1648 06/08/17 1936 06/09/17 0018 06/09/17 0432 06/09/17 0720 06/09/17 1147  GLUCAP 145* 120* 170* 155* 167* 140*    Imaging Ct Soft Tissue Neck W Contrast  Result Date: 06/08/2017 CLINICAL DATA:  Hypoxia.  Laryngeal mass. EXAM: CT NECK WITH CONTRAST TECHNIQUE: Multidetector CT imaging of the neck was performed using the standard protocol following the bolus administration of intravenous contrast. CONTRAST:  60mL ISOVUE-300 IOPAMIDOL (ISOVUE-300) INJECTION 61% COMPARISON:  CT neck 05/30/2017 and CT chest 05/30/2017. FINDINGS: Pharynx and larynx: Image quality is poor. No pharyngeal abnormalities. Redemonstrated is a glottic and supraglottic mass, likely arising from the RIGHT vocal cords, circumferential. No arytenoid sclerosis. Suspected invasion of the paraglottic fat on the RIGHT. Possible discontinuity/invasion of the RIGHT thyroid cartilage, see series 10, image 72, and series 7, image 95. Significant narrowing of the airway. No subglottic extension. Salivary glands: No inflammation, mass, or stone. Thyroid: Normal. Lymph nodes: None enlarged or abnormal density. Vascular: Atherosclerosis. Limited intracranial: Negative. Visualized orbits: Negative.  Mastoids and visualized paranasal sinuses: Layering RIGHT maxillary sinus fluid. Mastoids are clear Skeleton: Spondylosis.  No worrisome osseous findings. Upper chest: No change from priors. Other: Tracheostomy tube is partially outside the trachea. IMPRESSION: RIGHT glottic and supraglottic mass, narrowing the airway, with suspected invasion the paraglottic fat on the RIGHT. Possible discontinuity/invasion of the RIGHT thyroid cartilage. Tracheostomy tube appears to be only partially within the trachea. A call is in to the ordering  provider. Electronically Signed   By: Staci Righter M.D.   On: 06/08/2017 16:28   Ir Gastrostomy Tube Mod Sed  Result Date: 06/09/2017 INDICATION: Laryngeal CA, dysphagia EXAM: FLUOROSCOPIC 20 FRENCH PULL-THROUGH GASTROSTOMY Date:  06/09/2017 06/09/2017 2:01 pm Radiologist:  M. Daryll Brod, MD Guidance:  Fluoroscopic MEDICATIONS: Ancef 2 g; Antibiotics were administered within 1 hour of the procedure. Glucagon 0.5 mg IV ANESTHESIA/SEDATION: Versed 1 mg IV; Fentanyl 50 mcg IV Moderate Sedation Time:  15 minutes The patient was continuously monitored during the procedure by the interventional radiology nurse under my direct supervision. CONTRAST:  14mL ISOVUE-300 IOPAMIDOL (ISOVUE-300) INJECTION 61% - administered into the gastric lumen. FLUOROSCOPY TIME:  Fluoroscopy Time: 2 minutes 18 seconds (18 mGy). COMPLICATIONS: None immediate. PROCEDURE: Informed consent was obtained from the patient following explanation of the procedure, risks, benefits and alternatives. The patient understands, agrees and consents for the procedure. All questions were addressed. A time out was performed. Maximal barrier sterile technique utilized including caps, mask, sterile gowns, sterile gloves, large sterile drape, hand hygiene, and betadine prep. The left upper quadrant was sterilely prepped and draped. An oral gastric catheter was inserted into the stomach under fluoroscopy. The existing nasogastric feeding tube was removed. Air was injected into the stomach for insufflation and visualization under fluoroscopy. The air distended stomach was confirmed beneath the anterior abdominal wall in the frontal and lateral projections. Under sterile conditions and local anesthesia, a 35 gauge trocar needle was utilized to access the stomach percutaneously beneath the left subcostal margin. Needle position was confirmed within the stomach under biplane fluoroscopy. Contrast injection confirmed position also. A single T tack was deployed for  gastropexy. Over an Amplatz guide wire, a 9-French sheath was inserted into the stomach. A snare device was utilized to capture the oral gastric catheter. The snare device was pulled retrograde from the stomach up the esophagus and out the oropharynx. The 20-French pull-through gastrostomy was connected to the snare device and pulled antegrade through the oropharynx down the esophagus into the stomach and then through the percutaneous tract external to the patient. The gastrostomy was assembled externally. Contrast injection confirms position in the stomach. Images were obtained for documentation. The patient tolerated procedure well. No immediate complication. IMPRESSION: Fluoroscopic insertion of a 20-French "pull-through" gastrostomy. Electronically Signed   By: Jerilynn Mages.  Shick M.D.   On: 06/09/2017 14:10    STUDIES: CT head 3/17 >> Lt posterior scalp hematome CT neck 3/17 >> mass in supraglottic larynx with narrowing of airway CT angio chest 3/17 >> emphysema, atelectasis  EVENTS: 3/17 Admit, To OR for tracheostomy and direct laryngoscopy with biopsy 3/18 Off vent 3/19 Change to SDU status 3/27 Trach changed to an XLT by ENT   LINES/TUBES: Lurline Idol 3/17 >>   DISCUSSION: 72 y/o M initially admitted on 3/17 with IVC paperwork / hallucinations.  He developed stridor, SOB & hypoxia in the ER that did not respond to therapy.  CT of the neck showed moderate to severe narrowing of the supraglottic airway (approximately 85%) with concern for laryngeal neoplasm.  He was taken late that evening for emergent tracheostomy by Dr. Constance Holster. He returned to ICU on mechanical ventilation.  PCCM consulted.  Biopsy consistent with squamous cell carcinoma.  He developed respiratory distress 3/27 with concern for malpositioned tracheostomy and was transferred back to ICU.   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 Tracheostomy Placement  - Trach placement  by ENT, 6 XLT to bypass previously edematous tissue and procure an airway  Respiratory failure due to compromised airway - As above  COPD with emphysema. - pulmicort, brovana, PRN duoneb    Hallucinations from hypoxia and opiate use - resolved  - monitor   DM type II. - SSI   DVT prophylaxis - SCDs SUP - protonix Nutrition - NPO Goals of care - full code  Attending Note:  72 year old male with likely neck cancer that has severe airway obstruction.  PCCM was called for respiratory failure.  The patient was evaluated and on exam, the tracheostomy does not appear to be in place.  I reviewed OP noted and there is a fungating mass in the airway that made intubation very difficult.  If the patient is to require the vent he will require a cuffed tracheostomy preferably XLT to bypass edematous tissue.  But given how fresh this trach is and given how difficult it was in the operating room under controlled circumstance I will not attempt that bedside.  If develops failure will call anesthesia to intubate unless Dr. Constance Holster is able to get here on time to exchange the trach which I suggest be done in the OR.    Dr Constance Holster arrived and replaced trach with an XLT trach with procurement of an effective airway respiratory failure resolved and patient does not require a ventilator.  Will not place back on vent and instruction were given to the RT.  PCCM will be available PRN  The patient is critically ill with multiple organ systems failure and requires high complexity decision making for assessment and support, frequent evaluation and titration of therapies, application of advanced monitoring technologies and extensive interpretation of multiple databases.   Critical Care Time devoted to patient care services described in this note is  35  Minutes. This time reflects time of care of this signee Dr Jennet Maduro. This critical care time does not reflect procedure time, or teaching time or supervisory time of  PA/NP/Med student/Med Resident etc but could involve care discussion time.  Rush Farmer, M.D. [facility] Pulmonary/Critical Care Medicine. Pager: [phone]. After hours pager: [phone].

## 2017-06-09 NOTE — Progress Notes (Signed)
Patient ID: Vincent Hansen, male   DOB: 1945-05-05, 72 y.o.   MRN: [id number]  Called to evaluate patient again.  Having trouble ventilating.  He was transferred to the ICU.  On exam, his tracheostomy tube had dislodged.  I was able to replace it with a #6 XLT.  He has no trouble ventilating with the tube properly seated and secured in place.

## 2017-06-09 NOTE — Progress Notes (Signed)
PT Cancellation Note  Patient Details Name: Micaiah Litle MRN: [id number] DOB: 1945/09/25   Cancelled Treatment:    Reason Eval/Treat Not Completed: Patient at procedure or test/unavailable   Weston Anna, MPT Pager: [phone]

## 2017-06-09 NOTE — Progress Notes (Signed)
Called by RN this afternoon post PEG placement. He has had significant hypoxia in the 70s-80s with increased RR and work of breathing. Rapid response and respiratory therapy at bedside. On exam, he is less interactive although he remains alert. He is using accessory muscles, RR high 20s.   Spoke with PCCM/Dr. Nelda Marseille and ENT/Dr. Constance Holster.  RT to change to cuffed trach for MV. If issues with cuffed trach placement, Dr. Constance Holster will come to bedside  Check ABG, CXR   Dessa Phi, DO [facility] [email or web address] Password [facility] 06/09/2017, 2:54 PM

## 2017-06-09 NOTE — Progress Notes (Signed)
MD came and changed trach to #6 XLT. Pt has good color change on ezcap, equal bs and no distress noted.

## 2017-06-09 NOTE — Consult Note (Signed)
DENTAL CONSULTATION  Date of Consultation:  06/09/2017 Patient Name:   Vincent Hansen Date of Birth:   03/22/1945 Medical Record Number: [id number]  VITALS: BP (!) 147/69 (BP Location: Left Arm)   Pulse 77   Temp (!) 97 F (36.1 C) (Oral)   Resp 20   Ht 6' (1.829 m)   Wt 153 lb 14.1 oz (69.8 kg)   SpO2 91%   BMI 20.87 kg/m   CHIEF COMPLAINT: Patient referred by Dr. Rosen for a dental consultation.  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 Patient with anticipated laryngectomy with Dr. Rosen followed by postoperative chemoradiation therapy. The patient is now seen as part of a medically necessary pre-chemoradiation therapy dental protocol examination.  The patient currently denies acute toothaches, swellings, or abscesses. Patient was last seen by dentist approximately 2 years ago for periodontal therapy. This was with a [facility] dentist.  Patient does not seek regular dental care.  The patient has an upper complete denture that was fabricated approximately 8 years ago. Patient indicates that the denture "fits ok".  Patient denies having a lower partial denture. The patient denies having dental phobia.  PROBLEM LIST: Patient Active Problem List   Diagnosis Date Noted  . Abnormal CT scan, stomach   . Hiatal hernia   . DM2 (diabetes mellitus, type 2) (HCC) 06/04/2017  . HTN (hypertension) 06/04/2017  . Larynx cancer (HCC) 06/03/2017  . Laryngeal carcinoma (HCC)   . Malnutrition of moderate degree 06/01/2017  . Acute respiratory failure with hypoxia (HCC)   . Status post tracheostomy (HCC)     PMH: Past Medical History:  Diagnosis Date  . Diabetes mellitus without complication (HCC)   . Hypertension     PSH: Past Surgical History:  Procedure Laterality Date  . ESOPHAGOGASTRODUODENOSCOPY (EGD) WITH PROPOFOL N/A 06/07/2017   Procedure: ESOPHAGOGASTRODUODENOSCOPY (EGD) WITH PROPOFOL;  Surgeon: Jacobs,  Daniel P, MD;  Location: [facility] ENDOSCOPY;  Service: Endoscopy;  Laterality: N/A;  . HERNIA REPAIR    . TRACHEOSTOMY TUBE PLACEMENT N/A 05/30/2017   Procedure: TRACHEOSTOMY, LARYNGEAL MASS BIOPSY;  Surgeon: Rosen, Jefry, MD;  Location: [facility] ORS;  Service: ENT;  Laterality: N/A;    ALLERGIES: No Known Allergies  MEDICATIONS: Current Facility-Administered Medications  Medication Dose Route Frequency Provider Last Rate Last Dose  . arformoterol (BROVANA) nebulizer solution 15 mcg  15 mcg Nebulization BID Jacobs, Daniel P, MD   15 mcg at 06/09/17 0741  . budesonide (PULMICORT) nebulizer solution 0.5 mg  0.5 mg Nebulization BID Jacobs, Daniel P, MD   0.5 mg at 06/09/17 0741  . ceFAZolin (ANCEF) IVPB 2g/100 mL premix  2 g Intravenous to XRAY Allred, Darrell K, PA-C      . chlorhexidine gluconate (MEDLINE KIT) (PERIDEX) 0.12 % solution 15 mL  15 mL Mouth Rinse BID Jacobs, Daniel P, MD   15 mL at 06/09/17 0920  . dextrose 5 %-0.45 % sodium chloride infusion   Intravenous Continuous Jacobs, Daniel P, MD 125 mL/hr at 06/09/17 0332    . fentaNYL (SUBLIMAZE) injection 50-100 mcg  50-100 mcg Intravenous Q2H PRN Jacobs, Daniel P, MD   100 mcg at 06/09/17 0454  . insulin aspart (novoLOG) injection 0-15 Units  0-15 Units Subcutaneous Q4H Jacobs, Daniel P, MD   3 Units at 06/09/17 0921  . ipratropium-albuterol (DUONEB) 0.5-2.5 (3) MG/3ML nebulizer solution 3 mL  3 mL Nebulization Q4H PRN Jacobs, Daniel P, MD      .   labetalol (NORMODYNE,TRANDATE) injection 5-10 mg  5-10 mg Intravenous Q2H PRN Jacobs, Daniel P, MD   5 mg at 06/05/17 1620  . MEDLINE mouth rinse  15 mL Mouth Rinse QID Jacobs, Daniel P, MD   15 mL at 06/09/17 0347  . pantoprazole (PROTONIX) injection 40 mg  40 mg Intravenous Q24H Jacobs, Daniel P, MD   40 mg at 06/08/17 2119  . potassium chloride 10 mEq in 100 mL IVPB  10 mEq Intravenous Q1 Hr x 4 Choi, Jennifer, DO 100 mL/hr at 06/09/17 0921 10 mEq at 06/09/17 0921    LABS: Lab Results  Component  Value Date   WBC 7.1 06/09/2017   HGB 12.3 (L) 06/09/2017   HCT 39.2 06/09/2017   MCV 85.6 06/09/2017   PLT 251 06/09/2017      Component Value Date/Time   NA 143 06/09/2017 0526   K 3.1 (L) 06/09/2017 0526   CL 104 06/09/2017 0526   CO2 28 06/09/2017 0526   GLUCOSE 174 (H) 06/09/2017 0526   BUN 5 (L) 06/09/2017 0526   CREATININE 0.91 06/09/2017 0526   CALCIUM 8.9 06/09/2017 0526   GFRNONAA >60 06/09/2017 0526   GFRAA >60 06/09/2017 0526   Lab Results  Component Value Date   INR 1.14 06/09/2017   No results found for: PTT  SOCIAL HISTORY: Social History   Socioeconomic History  . Marital status: Single    Spouse name: Not on file  . Number of children: Not on file  . Years of education: Not on file  . Highest education level: Not on file  Occupational History  . Not on file  Social Needs  . Financial resource strain: Not on file  . Food insecurity:    Worry: Not on file    Inability: Not on file  . Transportation needs:    Medical: Not on file    Non-medical: Not on file  Tobacco Use  . Smoking status: Former Smoker  . Smokeless tobacco: Never Used  Substance and Sexual Activity  . Alcohol use: No  . Drug use: Never  . Sexual activity: Not on file  Lifestyle  . Physical activity:    Days per week: Not on file    Minutes per session: Not on file  . Stress: Not on file  Relationships  . Social connections:    Talks on phone: Not on file    Gets together: Not on file    Attends religious service: Not on file    Active member of club or organization: Not on file    Attends meetings of clubs or organizations: Not on file    Relationship status: Not on file  . Intimate partner violence:    Fear of current or ex partner: Not on file    Emotionally abused: Not on file    Physically abused: Not on file    Forced sexual activity: Not on file  Other Topics Concern  . Not on file  Social History Narrative  . Not on file    FAMILY HISTORY: Family  History  Problem Relation Age of Onset  . Cancer Neg Hx     REVIEW OF SYSTEMS: Reviewed with the patient as per History of present illness. Psych: Patient denies having dental phobia.  DENTAL HISTORY: CHIEF COMPLAINT: Patient referred by Dr. Rosen for a dental consultation.  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 Patient with anticipated laryngectomy with Dr. Rosen followed by   postoperative chemoradiation therapy. The patient is now seen as part of a medically necessary pre-chemoradiation therapy dental protocol examination.  The patient currently denies acute toothaches, swellings, or abscesses. Patient was last seen by dentist approximately 2 years ago for periodontal therapy. This was with a [facility] dentist.  Patient does not seek regular dental care.  The patient has an upper complete denture that was fabricated approximately 8 years ago. Patient indicates that the denture "fits ok".  Patient denies having a lower partial denture. The patient denies having dental phobia.  DENTAL EXAMINATION: GENERAL:  The patient is a tall, well-developed male in no acute distress. HEAD AND NECK:  The patient has a tracheostomy in place. There is no obvious neck lymphadenopathy that is palpated. INTRAORAL EXAM:  The patient has copious saliva. There is no evidence of oral abscess formation. DENTITION:  Patient is missing all maxillary teeth as well as tooth numbers 17, 18, 21, 23, and 30-32. PERIODONTAL:  The patient has chronic periodontitis with plaque and calculus accumulations, gingival recession, and no significant tooth mobility. There is moderate bone loss noted. DENTAL CARIES/SUBOPTIMAL RESTORATIONS:  No obvious dental caries are noted. ENDODONTIC:the patient currently denies acute pulpitis symptoms. CROWN AND BRIDGE:  There are no crown or bridge restorations. PROSTHODONTIC: The patient has an upper complete denture  that appears to be stable.there is no lower partial denture. OCCLUSION:  The occlusion is acceptable at this time.  RADIOGRAPHIC INTERPRETATION: No orthopantogram was available at this time. Will attempt to obtain an orthopantogram at [place] [facility] once he is transferred to that hospital.   ASSESSMENTS: 1. Squamous cell carcinoma of the larynx 2. Anticipated laryngectomy followed by postoperative chemoradiation therapy 3. Chronic periodontitis with bone loss 4. Gingival recession 5. Accretions 6. Multiple missing teeth 7. Upper complete denture and no lower partial denture. 8. Poor occlusal scheme but a stable occlusion   PLAN/RECOMMENDATIONS: 1. I discussed the risks, benefits, and complications of various treatment options with the patient in relationship to his medical and dental conditions, Anticipated laryngectomy followed by postoperative chemoradiation therapy, and chemoradiation therapy side effects to include xerostomia, radiation caries, trismus, mucositis, taste changes, gum and jawbone changes, and risk for infection and osteoradionecrosis. We discussed various treatment options to include no treatment, multiple extractions with alveoloplasty, pre-prosthetic surgery as indicated, periodontal therapy, dental restorations, root canal therapy, crown and bridge therapy, implant therapy, and replacement of missing teeth as indicated. The patient currently wishes to defer any dental treatment at this time pending results of laryngectomy.  I will review anticipated ports from Dr. Squire if postoperative radiation therapy is recommended. Most likely, there will be no teeth in the primary field of radiation therapy. The patient will need to be seen in follow-up for fabrication of lower fluoride tray and possible periodontal therapy prior to start of chemoradiation therapy.  2. Discussion of findings with medical team and coordination of future medical and dental care as needed. I have  recommended an orthopantogram to be taken once the patient is transferred to [facility].     F. , DDS 

## 2017-06-10 LAB — BASIC METABOLIC PANEL
Anion gap: 12 (ref 5–15)
BUN: 6 mg/dL (ref 6–20)
CALCIUM: 8.8 mg/dL — AB (ref 8.9–10.3)
CO2: 29 mmol/L (ref 22–32)
CREATININE: 1.09 mg/dL (ref 0.61–1.24)
Chloride: 102 mmol/L (ref 101–111)
GFR calc Af Amer: >60 mL/min (ref >60)
GFR calc non Af Amer: >60 mL/min (ref >60)
GLUCOSE: 125 mg/dL — AB (ref 65–99)
POTASSIUM: 3.4 mmol/L — AB (ref 3.5–5.1)
Sodium: 143 mmol/L (ref 135–145)

## 2017-06-10 LAB — GLUCOSE, CAPILLARY
GLUCOSE-CAPILLARY: 141 mg/dL — AB (ref 65–99)
GLUCOSE-CAPILLARY: 151 mg/dL — AB (ref 65–99)
GLUCOSE-CAPILLARY: 157 mg/dL — AB (ref 65–99)
GLUCOSE-CAPILLARY: 195 mg/dL — AB (ref 65–99)
Glucose-Capillary: 164 mg/dL — ABNORMAL HIGH (ref 65–99)
Glucose-Capillary: 188 mg/dL — ABNORMAL HIGH (ref 65–99)

## 2017-06-10 LAB — CBC
HCT: 39.9 % (ref 39.0–52.0)
Hemoglobin: 13.1 g/dL (ref 13.0–17.0)
MCH: 27.5 pg (ref 26.0–34.0)
MCHC: 32.8 g/dL (ref 30.0–36.0)
MCV: 83.8 fL (ref 78.0–100.0)
PLATELETS: 244 10*3/uL (ref 150–400)
RBC: 4.76 MIL/uL (ref 4.22–5.81)
RDW: 13.1 % (ref 11.5–15.5)
WBC: 9.3 10*3/uL (ref 4.0–10.5)

## 2017-06-10 LAB — MAGNESIUM: Magnesium: 1.4 mg/dL — ABNORMAL LOW (ref 1.7–2.4)

## 2017-06-10 LAB — PHOSPHORUS: Phosphorus: 2.2 mg/dL — ABNORMAL LOW (ref 2.5–4.6)

## 2017-06-10 MED ORDER — POTASSIUM CHLORIDE 10 MEQ/100ML IV SOLN
10.0000 meq | INTRAVENOUS | Status: DC
  Administered 2017-06-10: 10 meq via INTRAVENOUS
  Filled 2017-06-10: qty 100

## 2017-06-10 MED ORDER — FREE WATER
100.0000 mL | Status: DC
  Administered 2017-06-10 – 2017-06-14 (×24): 100 mL

## 2017-06-10 MED ORDER — MAGNESIUM SULFATE 2 GM/50ML IV SOLN
2.0000 g | Freq: Once | INTRAVENOUS | Status: AC
  Administered 2017-06-10: 2 g via INTRAVENOUS
  Filled 2017-06-10: qty 50

## 2017-06-10 MED ORDER — POTASSIUM CHLORIDE 20 MEQ/15ML (10%) PO SOLN
40.0000 meq | Freq: Once | ORAL | Status: AC
  Administered 2017-06-10: 40 meq
  Filled 2017-06-10: qty 30

## 2017-06-10 MED ORDER — JEVITY 1.5 CAL/FIBER PO LIQD
1000.0000 mL | ORAL | Status: DC
  Administered 2017-06-10: 1000 mL
  Filled 2017-06-10: qty 1000

## 2017-06-10 NOTE — Progress Notes (Addendum)
  Speech Language Pathology Treatment: [name] Speaking valve(information on laryngectomy)  Patient Details Name: Vincent Hansen MRN: [id number] DOB: Jun 17, 1945 Today's Date: 06/10/2017 Time: 0037-0488 SLP Time Calculation (min) (ACUTE ONLY): 40 min  Assessment / Plan / Recommendation Clinical Impression  Pt seen for ongoing education re: laryngectomy planned for next week.  He expressed frustration *via writing* today that he "can't talk" and  "God didn't do it".   Informed pt that he will have his larynx removed next week and thus his vocal folds will no longer be present.  Uncertain to patient's level of understanding despite being educated. His internal frustration impairs his participation as he mostly looked away from this SLP during the session.    SLP reviewed alternative means of communication - including use of communication boards, writing, possibility of use of electrolarynx.  Pt had previously stated "I don't like that" when asked re: electrolarynx.     RT had deflated trach cuff per SLP request and suctioned pt's trach removing copious viscous secretions.  SLP decided not to place PMSV today due to secretions that pt is unable to clear due to concern for mucus plugging.  Pt admitted to discomfort with secretions and requested cuff be re-inflated. Advised RT.    Will follow up next week in hopes for trial of electrolarynx to maximize pt's efficient communication.  SLP will mail off card for pt to receive Self Help for Laryngectomee Book by Arne Cleveland.  Thanks.    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  Pt is s/p emergent trach and order for swallow/PMSV evaluations received. CXR showed Low lung volumes with areas of scarring and/or subsegmental atelectasis in the lower lobes of the lungs bilaterally.     Pt denies h/o dysphagia and admits to  dysphonia.       SLP Plan  Continue with current plan of care       Recommendations  Diet recommendations: (pt to be able to swish and expectorate water only - he is grossly aspirating secretions) Medication Administration: Via alternative means      PMSV Supervision: Intermittent         Oral Care Recommendations: Oral care QID Follow up Recommendations: (tbd) SLP Visit Diagnosis: Aphonia (R49.1);Other (comment)(laryngectomy planned for next week) Plan: Continue with current plan of care       [place], [facility] SLP [phone]  [name] 06/10/2017, 4:00 PM

## 2017-06-10 NOTE — Progress Notes (Signed)
NUTRITION NOTE  Consult for TF initiation and management received from yesterday evening. Pt with PEG placed by IR yesterday. Follow-up nutrition assessment completed by dietetic intern being supervised by this RD yesterday (note at 3:10 PM).   This RD placed order for 30 mL Prostat once/day with Jevity 1.5 @ 10 mL/hr to advance by 10 mL every 12 hours to reach goal rate of Jevity 1.5 @ 60 mL/hr. At goal rate, this regimen will provide 2260 kcal, 107 grams of protein, and 1094 mL free water.   Will hold Jevity 1.5 @ 20 mL/hr today and re-assess tomorrow morning d/t current hypokalemia (3.4 mmol/L), hypophosphatemia (2.2 mg/dL), and hypomagnesemia (1.4 mg/dL).   Pt noted to be high refeeding risk d/t prolonged period without nutrition and may require TF advancement of 10 mL every 24 hours rather than every 12 hours if refeeding persists.  Orders in place for 2 g IV Mg sulfate x1 run today and 40 mEq KCl per PEG x1 today.  Will also add free water flush of 100 mL every 4 hours (600 mL/day).   Estimated Nutritional Needs:  Kcal:  2100-2300 kcal (30-33 kcal/kg rounded) Protein:  105-115 grams (20% kcal) Fluid:  >=2.1 L/day     Jarome Matin, MS, RD, LDN, CNSC Inpatient Clinical Dietitian Pager # [phone] After hours/weekend pager # [phone]

## 2017-06-10 NOTE — Progress Notes (Signed)
Pt s/p G tube yesterday; G tube intact, insertion site ok, mildly tender, no leaking; ok to use for feeds

## 2017-06-10 NOTE — Evaluation (Signed)
Physical Therapy Evaluation Patient Details Name: Vincent Hansen MRN: [id number] DOB: 1945/05/25 Today's Date: 06/10/2017   History of Present Illness  72 year old with past medical history relevant for hypertension, COPD, type 2 diabetes, prior history of squamous cell carcinoma of the larynx admitted with hypoxia and found to have large laryngeal mass obstructing airway status post emergency tracheostomy placement with 6 Shiley tracheostomy on 05/30/2017 status post extubation on 05/31/2017. Due to continued aspiration risk, he is kept NPO and PEG was placed for nutrition. ENT planning for total laryngectomy next week.  Clinical Impression  Pt admitted with above diagnosis. Pt currently with functional limitations due to the deficits listed below (see PT Problem List). Min assist for bed to recliner transfer. Pt somewhat unsteady in standing so didn't attempt ambulation. Pt will likely need ST-SNF following acute stay.  Pt will benefit from skilled PT to increase their independence and safety with mobility to allow discharge to the venue listed below.       Follow Up Recommendations SNF;Supervision for mobility/OOB    Equipment Recommendations  Rolling walker with 5" wheels    Recommendations for Other Services       Precautions / Restrictions Precautions Precautions: Fall Precaution Comments: trach Restrictions Weight Bearing Restrictions: No      Mobility  Bed Mobility Overal bed mobility: Needs Assistance Bed Mobility: Supine to Sit     Supine to sit: Min assist;HOB elevated     General bed mobility comments: min A to raise trunk  Transfers Overall transfer level: Needs assistance Equipment used: None;2 person hand held assist Transfers: Sit to/from [facility] Sit to Stand: Min assist;+2 safety/equipment Stand pivot transfers: Min assist;+2 safety/equipment       General transfer comment: min A to rise and to steady  Ambulation/Gait             General Gait Details: deferred 2* unsteadiness with pivot to recliner  Stairs            Wheelchair Mobility    Modified Rankin (Stroke Patients Only)       Balance Overall balance assessment: Needs assistance Sitting-balance support: Feet supported Sitting balance-Leahy Scale: Good       Standing balance-Leahy Scale: Fair                               Pertinent Vitals/Pain Faces Pain Scale: Hurts little more Pain Location: trach site Pain Descriptors / Indicators: Guarding Pain Intervention(s): Monitored during session;Limited activity within patient's tolerance;Patient requesting pain meds-RN notified    Home Living Family/patient expects to be discharged to:: Unsure Living Arrangements: Alone                    Prior Function Level of Independence: Independent with assistive device(s)               Hand Dominance   Dominant Hand: Right    Extremity/Trunk Assessment   Upper Extremity Assessment Upper Extremity Assessment: Overall WFL for tasks assessed    Lower Extremity Assessment Lower Extremity Assessment: Overall WFL for tasks assessed    Cervical / Trunk Assessment Cervical / Trunk Assessment: Normal  Communication   Communication: Tracheostomy(pt writes and shakes head to communicate, written communication doesn't always make sense (he wrote "you can wash tv" to me))  Cognition Arousal/Alertness: Awake/alert Behavior During Therapy: WFL for tasks assessed/performed Overall Cognitive Status: No family/caregiver present to determine baseline cognitive functioning  General Comments: can follow 1 step commands, written communication doesn't always make sense      General Comments General comments (skin integrity, edema, etc.): VSS on 12L trach collar throughout tx    Exercises     Assessment/Plan    PT Assessment Patient needs continued PT services  PT Problem  List Decreased balance;Decreased mobility;Decreased activity tolerance;Cardiopulmonary status limiting activity;Decreased cognition       PT Treatment Interventions Therapeutic activities;Therapeutic exercise;Gait training;Patient/family education;Functional mobility training    PT Goals (Current goals can be found in the Care Plan section)  Acute Rehab PT Goals Patient Stated Goal: none stated PT Goal Formulation: Patient unable to participate in goal setting Time For Goal Achievement: 06/24/17 Potential to Achieve Goals: Fair    Frequency Min 2X/week   Barriers to discharge        Co-evaluation               AM-PAC PT "6 Clicks" Daily Activity  Outcome Measure Difficulty turning over in bed (including adjusting bedclothes, sheets and blankets)?: A Lot Difficulty moving from lying on back to sitting on the side of the bed? : Unable Difficulty sitting down on and standing up from a chair with arms (e.g., wheelchair, bedside commode, etc,.)?: Unable Help needed moving to and from a bed to chair (including a wheelchair)?: A Lot Help needed walking in hospital room?: Total Help needed climbing 3-5 steps with a railing? : Total 6 Click Score: 8    End of Session Equipment Utilized During Treatment: Gait belt;Oxygen Activity Tolerance: Patient tolerated treatment well Patient left: in chair;with call bell/phone within reach;with chair alarm set Nurse Communication: Mobility status PT Visit Diagnosis: Difficulty in walking, not elsewhere classified (R26.2)    Time: 4536-4680 PT Time Calculation (min) (ACUTE ONLY): 18 min   Charges:   PT Evaluation $PT Eval Moderate Complexity: 1 Mod     PT G Codes:          Philomena Doheny 06/10/2017, 12:46 PM [phone]

## 2017-06-10 NOTE — Progress Notes (Signed)
PROGRESS NOTE    Vincent Hansen  IRW:[id number] DOB: 26-Nov-1945 DOA: 05/30/2017 PCP: Patient, No Pcp Per     Brief Narrative:  Vincent Hansen is a 72 year old with past medical history relevant for hypertension, COPD, type 2 diabetes, prior history of squamous cell carcinoma of the larynx admitted with hypoxia and found to have large laryngeal mass obstructing airway status post emergency tracheostomy placement with 6 Shiley tracheostomy on 05/30/2017 status post extubation on 05/31/2017. Due to continued aspiration risk, he is kept NPO and PEG was placed for nutrition. ENT planning for total laryngectomy next week.  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 Currently has a #6 XLT  -ENT planning for total laryngectomy at [facility] 4/3 Wednesday  -Will need dental follow up for periodontal therapy prior to start of chemoradiation therapy. Recommend orthopantogram once transferred to [facility] -Oncology following for adjuvant systemic therapy and radiation post-surgery recovery  -PEG placed 3/27, tube feeding initiated   Hiatal hernia -CT of the abdomen showed concern for possible gastric adenocarcinoma versus hiatal hernia -S/p EGD 06/07/2017 and showed no evidence of gastric adenocarcinoma but did show medium sized hiatal hernia   Hypokalemia -Replace today, trend   Hypomagnesemia -Replace, trend   Hypertension -Continue as needed labetalol every 2 hours  IV -BP stable today   Type 2 diabetes -SSI   COPD -Without exacerbation. Continue brovana, pulmicort    DVT prophylaxis: SCD Code Status: Full Family Communication: No family at bedside Disposition Plan: Started on tube feeding 3/27. Replace electrolytes. Planning for laryngectomy next week. Will need SNF at discharge post-op.    Consultants:   ENT  IR  GI  Oncology  Procedures:   Emergent trach and laryngoscopy 3/17 by Dr. Constance Holster   EGD 3/25   Antimicrobials:  Anti-infectives (From admission, onward)   Start     Dose/Rate Route Frequency Ordered Stop   06/09/17 1500  ceFAZolin (ANCEF) IVPB 2g/100 mL premix     2 g 200 mL/hr over 30 Minutes Intravenous To Radiology 06/08/17 1427 06/09/17 1410       Subjective: Able to communicate by writing. Some abdominal soreness but denies nausea.   Objective: Vitals:   06/10/17 0500 06/10/17 0600 06/10/17 0700 06/10/17 0800  BP: (!) 141/98 135/76 (!) 148/72   Pulse:      Resp: (!) 22 (!) 21 (!) 24   Temp:    99.3 F (37.4 C)  TempSrc:    Oral  SpO2: 98% 99% 93%   Weight:      Height:        Intake/Output Summary (Last 24 hours) at 06/10/2017 1223 Last data filed at 06/09/2017 1819 Gross per 24 hour  Intake 2439.58 ml  Output -  Net 2439.58 ml   Filed Weights   06/02/17 0500 06/04/17 0435 06/09/17 1500  Weight: 72.2 kg (159 lb 2.8 oz) 69.8 kg (153 lb 14.1 oz) 74.8 kg (164 lb 14.5 oz)   Examination: General exam: Appears calm and  comfortable  Respiratory system: Clear to auscultation. Respiratory effort normal. +trach in place  Cardiovascular system: S1 & S2 heard, RRR. No JVD, murmurs, rubs, gallops or clicks. No pedal edema. Gastrointestinal system: Abdomen is nondistended, soft and TTP around PEG site. No organomegaly or masses felt. Normal bowel sounds heard. Central nervous system: Alert and oriented. No focal neurological deficits. Extremities: Symmetric 5 x 5 power. Skin: No rashes, lesions or  ulcers  Data Reviewed: I have personally reviewed following labs and imaging studies  CBC: Recent Labs  Lab 06/04/17 2043 06/07/17 0501 06/09/17 0526 06/10/17 0331  WBC 11.9* 6.9 7.1 9.3  NEUTROABS  --   --  6.0  --   HGB 14.8 12.8* 12.3* 13.1  HCT 46.8 41.4 39.2 39.9  MCV 87.3 87.3 85.6 83.8  PLT 302 240 251 993   Basic Metabolic Panel: Recent Labs  Lab 06/04/17 2043  06/06/17 0320 06/07/17 0501 06/08/17 0556 06/09/17 0526 06/10/17 0331  NA 152*   < > 148* 146* 145 143 143  K 5.0   < > 3.6 3.4* 3.9 3.1* 3.4*  CL 105   < > 109 108 109 104 102  CO2 25   < > '30 29 30 28 29  ' GLUCOSE 174*   < > 212* 180* 161* 174* 125*  BUN 39*   < > '18 10 8 ' 5* 6  CREATININE 1.56*   < > 1.10 1.09 0.93 0.91 1.09  CALCIUM 10.2   < > 9.2 9.1 8.9 8.9 8.8*  MG 2.0  --   --  1.5* 1.8  --  1.4*  PHOS 3.7  --   --   --   --   --  2.2*   < > = values in this interval not displayed.   GFR: Estimated Creatinine Clearance: 65.8 mL/min (by C-G formula based on SCr of 1.09 mg/dL). Liver Function Tests: Recent Labs  Lab 06/04/17 2043 06/09/17 0526  AST 19 18  ALT 27 19  ALKPHOS 76 58  BILITOT 1.4* 0.8  PROT 7.7 6.1*  ALBUMIN 3.7 2.8*   No results for input(s): LIPASE, AMYLASE in the last 168 hours. No results for input(s): AMMONIA in the last 168 hours. Coagulation Profile: Recent Labs  Lab 06/09/17 0526  INR 1.14   Cardiac Enzymes: No results for input(s): CKTOTAL, CKMB, CKMBINDEX, TROPONINI in the last 168 hours. BNP (last 3 results) No results for input(s): PROBNP in the last 8760 hours. HbA1C: No results for input(s): HGBA1C in the last 72 hours. CBG: Recent Labs  Lab 06/09/17 1618 06/09/17 2116 06/10/17 0056 06/10/17 0744 06/10/17 1118  GLUCAP 153* 168* 141* 151* 188*   Lipid Profile: No results for input(s): CHOL, HDL, LDLCALC, TRIG, CHOLHDL, LDLDIRECT in the last 72 hours. Thyroid Function Tests: No results for input(s): TSH, T4TOTAL, FREET4, T3FREE, THYROIDAB in  the last 72 hours. Anemia Panel: No results for input(s): VITAMINB12, FOLATE, FERRITIN, TIBC, IRON, RETICCTPCT in the last 72 hours. Sepsis Labs: No results for input(s): PROCALCITON, LATICACIDVEN in the last 168 hours.  No results found for this or any previous visit (from the past 240 hour(s)).     Radiology Studies: Ct Soft Tissue Neck W Contrast  Result Date: 06/08/2017 CLINICAL DATA:  Hypoxia.  Laryngeal mass. EXAM: CT NECK WITH CONTRAST TECHNIQUE: Multidetector CT imaging of the neck was performed using the standard protocol following the bolus administration of intravenous contrast. CONTRAST:  20m ISOVUE-300 IOPAMIDOL (ISOVUE-300) INJECTION 61% COMPARISON:  CT neck 05/30/2017 and CT  chest 05/30/2017. FINDINGS: Pharynx and larynx: Image quality is poor. No pharyngeal abnormalities. Redemonstrated is a glottic and supraglottic mass, likely arising from the RIGHT vocal cords, circumferential. No arytenoid sclerosis. Suspected invasion of the paraglottic fat on the RIGHT. Possible discontinuity/invasion of the RIGHT thyroid cartilage, see series 10, image 72, and series 7, image 95. Significant narrowing of the airway. No subglottic extension. Salivary glands: No inflammation, mass, or stone. Thyroid: Normal. Lymph nodes: None enlarged or abnormal density. Vascular: Atherosclerosis. Limited intracranial: Negative. Visualized orbits: Negative. Mastoids and visualized paranasal sinuses: Layering RIGHT maxillary sinus fluid. Mastoids are clear Skeleton: Spondylosis.  No worrisome osseous findings. Upper chest: No change from priors. Other: Tracheostomy tube is partially outside the trachea. IMPRESSION: RIGHT glottic and supraglottic mass, narrowing the airway, with suspected invasion the paraglottic fat on the RIGHT. Possible discontinuity/invasion of the RIGHT thyroid cartilage. Tracheostomy tube appears to be only partially within the trachea. A call is in to the ordering provider. Electronically  Signed   By: Staci Righter M.D.   On: 06/08/2017 16:28   Ir Gastrostomy Tube Mod Sed  Result Date: 06/09/2017 INDICATION: Laryngeal CA, dysphagia EXAM: FLUOROSCOPIC 20 FRENCH PULL-THROUGH GASTROSTOMY Date:  06/09/2017 06/09/2017 2:01 pm Radiologist:  M. Daryll Brod, MD Guidance:  Fluoroscopic MEDICATIONS: Ancef 2 g; Antibiotics were administered within 1 hour of the procedure. Glucagon 0.5 mg IV ANESTHESIA/SEDATION: Versed 1 mg IV; Fentanyl 50 mcg IV Moderate Sedation Time:  15 minutes The patient was continuously monitored during the procedure by the interventional radiology nurse under my direct supervision. CONTRAST:  106m ISOVUE-300 IOPAMIDOL (ISOVUE-300) INJECTION 61% - administered into the gastric lumen. FLUOROSCOPY TIME:  Fluoroscopy Time: 2 minutes 18 seconds (18 mGy). COMPLICATIONS: None immediate. PROCEDURE: Informed consent was obtained from the patient following explanation of the procedure, risks, benefits and alternatives. The patient understands, agrees and consents for the procedure. All questions were addressed. A time out was performed. Maximal barrier sterile technique utilized including caps, mask, sterile gowns, sterile gloves, large sterile drape, hand hygiene, and betadine prep. The left upper quadrant was sterilely prepped and draped. An oral gastric catheter was inserted into the stomach under fluoroscopy. The existing nasogastric feeding tube was removed. Air was injected into the stomach for insufflation and visualization under fluoroscopy. The air distended stomach was confirmed beneath the anterior abdominal wall in the frontal and lateral projections. Under sterile conditions and local anesthesia, a 184gauge trocar needle was utilized to access the stomach percutaneously beneath the left subcostal margin. Needle position was confirmed within the stomach under biplane fluoroscopy. Contrast injection confirmed position also. A single T tack was deployed for gastropexy. Over an Amplatz  guide wire, a 9-French sheath was inserted into the stomach. A snare device was utilized to capture the oral gastric catheter. The snare device was pulled retrograde from the stomach up the esophagus and out the oropharynx. The 20-French pull-through gastrostomy was connected to the snare device and pulled antegrade through the oropharynx down the esophagus into the stomach and then through the percutaneous tract external to the patient. The gastrostomy was assembled externally. Contrast injection confirms position in the stomach. Images were obtained for documentation. The patient tolerated procedure well. No immediate complication. IMPRESSION: Fluoroscopic insertion of a 20-French "pull-through" gastrostomy. Electronically Signed   By: MJerilynn Mages  Shick M.D.   On: 06/09/2017 14:10   Dg Chest Port 1 View  Result Date: 06/09/2017 CLINICAL DATA:  Respiratory distress, history of respiratory failure, laryngeal malignancy, former smoker. EXAM: PORTABLE CHEST 1 VIEW  COMPARISON:  Portable chest x-ray of June 01, 2017 FINDINGS: The lungs are well-expanded. There is no focal infiltrate. The tracheostomy tube tip projects at the inferior margin of the clavicular heads. The heart and pulmonary vascularity are normal. There is calcification in the wall of the aortic arch. The bony thorax exhibits no acute abnormality. IMPRESSION: Chronic bronchitic-smoking related changes, stable. No acute pneumonia nor CHF. Electronically Signed   By: David  Martinique M.D.   On: 06/09/2017 15:18      Scheduled Meds: . arformoterol  15 mcg Nebulization BID  . budesonide (PULMICORT) nebulizer solution  0.5 mg Nebulization BID  . chlorhexidine gluconate (MEDLINE KIT)  15 mL Mouth Rinse BID  . feeding supplement (JEVITY 1.5 CAL/FIBER)  1,000 mL Per Tube Q24H  . feeding supplement (PRO-STAT SUGAR FREE 64)  30 mL Per Tube Daily  . free water  100 mL Per Tube Q4H  . insulin aspart  0-15 Units Subcutaneous Q4H  . mouth rinse  15 mL Mouth  Rinse QID  . pantoprazole (PROTONIX) IV  40 mg Intravenous Q24H   Continuous Infusions:    LOS: 11 days    Time spent: 20 minutes   Dessa Phi, DO [facility] [email or web address] Password [facility] 06/10/2017, 12:23 PM

## 2017-06-11 LAB — CBC
HEMATOCRIT: 36.7 % — AB (ref 39.0–52.0)
HEMOGLOBIN: 11.8 g/dL — AB (ref 13.0–17.0)
MCH: 27 pg (ref 26.0–34.0)
MCHC: 32.2 g/dL (ref 30.0–36.0)
MCV: 84 fL (ref 78.0–100.0)
Platelets: 229 10*3/uL (ref 150–400)
RBC: 4.37 MIL/uL (ref 4.22–5.81)
RDW: 13.4 % (ref 11.5–15.5)
WBC: 7.8 10*3/uL (ref 4.0–10.5)

## 2017-06-11 LAB — BASIC METABOLIC PANEL
Anion gap: 7 (ref 5–15)
BUN: 13 mg/dL (ref 6–20)
CHLORIDE: 104 mmol/L (ref 101–111)
CO2: 32 mmol/L (ref 22–32)
Calcium: 8.8 mg/dL — ABNORMAL LOW (ref 8.9–10.3)
Creatinine, Ser: 1.16 mg/dL (ref 0.61–1.24)
GFR calc Af Amer: >60 mL/min (ref >60)
GFR calc non Af Amer: >60 mL/min (ref >60)
Glucose, Bld: 166 mg/dL — ABNORMAL HIGH (ref 65–99)
POTASSIUM: 3.6 mmol/L (ref 3.5–5.1)
SODIUM: 143 mmol/L (ref 135–145)

## 2017-06-11 LAB — GLUCOSE, CAPILLARY
GLUCOSE-CAPILLARY: 148 mg/dL — AB (ref 65–99)
Glucose-Capillary: 163 mg/dL — ABNORMAL HIGH (ref 65–99)
Glucose-Capillary: 168 mg/dL — ABNORMAL HIGH (ref 65–99)
Glucose-Capillary: 191 mg/dL — ABNORMAL HIGH (ref 65–99)
Glucose-Capillary: 167 mg/dL — ABNORMAL HIGH (ref 65–99)

## 2017-06-11 LAB — PHOSPHORUS: Phosphorus: 2.6 mg/dL (ref 2.5–4.6)

## 2017-06-11 LAB — MAGNESIUM: MAGNESIUM: 1.9 mg/dL (ref 1.7–2.4)

## 2017-06-11 MED ORDER — FENTANYL CITRATE (PF) 100 MCG/2ML IJ SOLN
50.0000 ug | INTRAMUSCULAR | Status: DC | PRN
Start: 2017-06-11 — End: 2017-06-23
  Administered 2017-06-11 – 2017-06-12 (×11): 100 ug via INTRAVENOUS
  Administered 2017-06-13: 50 ug via INTRAVENOUS
  Administered 2017-06-13 – 2017-06-17 (×35): 100 ug via INTRAVENOUS
  Administered 2017-06-17: 50 ug via INTRAVENOUS
  Administered 2017-06-17 – 2017-06-19 (×3): 100 ug via INTRAVENOUS
  Administered 2017-06-19: 50 ug via INTRAVENOUS
  Administered 2017-06-19 – 2017-06-23 (×6): 100 ug via INTRAVENOUS
  Filled 2017-06-11 (×57): qty 2

## 2017-06-11 MED ORDER — ACETAMINOPHEN 160 MG/5ML PO SOLN
650.0000 mg | Freq: Four times a day (QID) | ORAL | Status: DC | PRN
  Administered 2017-06-28 – 2017-07-05 (×8): 650 mg
  Filled 2017-06-11 (×8): qty 20.3

## 2017-06-11 MED ORDER — SCOPOLAMINE 1 MG/3DAYS TD PT72
1.0000 | MEDICATED_PATCH | TRANSDERMAL | Status: DC
Start: 2017-06-11 — End: 2017-06-16
  Administered 2017-06-11 – 2017-06-14 (×2): 1.5 mg via TRANSDERMAL
  Filled 2017-06-11 (×2): qty 1

## 2017-06-11 MED ORDER — POTASSIUM CHLORIDE 20 MEQ/15ML (10%) PO SOLN
40.0000 meq | Freq: Once | ORAL | Status: AC
  Administered 2017-06-11: 40 meq
  Filled 2017-06-11: qty 30

## 2017-06-11 MED ORDER — KETOROLAC TROMETHAMINE 15 MG/ML IJ SOLN
15.0000 mg | Freq: Four times a day (QID) | INTRAMUSCULAR | Status: DC | PRN
  Administered 2017-06-11: 15 mg via INTRAVENOUS
  Filled 2017-06-11 (×2): qty 1

## 2017-06-11 MED ORDER — JEVITY 1.5 CAL/FIBER PO LIQD
1000.0000 mL | ORAL | Status: DC
  Administered 2017-06-11 – 2017-06-13 (×3): 1000 mL
  Filled 2017-06-11 (×4): qty 1000

## 2017-06-11 MED ORDER — PANTOPRAZOLE SODIUM 40 MG PO PACK
40.0000 mg | PACK | Freq: Every day | ORAL | Status: DC
  Administered 2017-06-11 – 2017-06-30 (×19): 40 mg
  Filled 2017-06-11 (×21): qty 20

## 2017-06-11 NOTE — Progress Notes (Signed)
Pt transferred from ICU to room 1418. Trach suction/ supplies at bedside. RN agrees with previous assessment from prior RN. Pt stable at this time and will continue to monitor.

## 2017-06-11 NOTE — Progress Notes (Addendum)
Nutrition Follow-up  DOCUMENTATION CODES:   Non-severe (moderate) malnutrition in context of chronic illness  INTERVENTION:   Continue 30 mL Prostat once/day    Continue Jevity 1.5 @ 20 mL/hr and advance by 10 mL every 24 hours to reach goal rate of Jevity 1.5 @ 60 mL/hr. At goal rate, this regimen will provide 2260 kcal, 107 grams of protein, and 1094 mL free water.  Slow advancement of tube feeding due to continued low lab values for K/Mg/Phos   Continue free water flush of 100 mL every 4 hours (600 mL/day).   Monitor magnesium, potassium, and phosphorus daily for at least 3 days, MD to replete as needed, as pt is at risk for refeeding syndrome given moderate malnutrition and previous prolonged NPO status   NUTRITION DIAGNOSIS:   Moderate Malnutrition related to chronic illness(COPD, Type 2 DM) as evidenced by moderate muscle depletion, moderate fat depletion.  Ongoing  GOAL:   Patient will meet greater than or equal to 90% of their needs  Unmet  MONITOR:   TF tolerance, Labs, Weight trends, Skin  ASSESSMENT:   72yo M w/ PMH including HTN, COPD and T2DM. Pt brough in by police who report that the patient called them to the home 3-4 times in the 24 hours PTA.  Each time they noted that he seemed to be responding to internal stimuli, talking to people who were not actually there and he appeared to be having hallucinations. On 5/99 the police contacted the crisis line, who did assessment and IVC'd patient for auditory and visual hallucinations, concern for safety.   3/27  PEG placed and per MD progress note tube feeding started Laryngectomy still scheduled for next week   3/28 Per RD note, orders are in place for Prostat and Jevity but due to pt refeeding risk tube feeding will be held pending re-assessment of K/Mg/Phos  **During dietetic intern pt assessment on 3/29, it was noted that pt is receiving Jevity; review of medications-- Jevity was initiated on 3/28 @ 14:40  at a rate of 13m/hr  3/29 Per radiology G-tube is ready for use once pt is able to begin tube feed   Met with pt with RN in the room:   Pt is frustrated that he is not able to talk (currently writing questions/answers on paper), limiting effective communication.  Pt reports that he is still thirsty and in pain  A follow-up NFPE was performed during the nutrition assessment visit to re-assess pt's malnutrition status due to prolonged inadequate nutrition (see below). Pt is more depleted than he was during NFPE performed during initial RD visit (increased risk of severe malnutrition)  Pt weight has remained relatively stable  3/22 69.8 kg; 3/27 74.8 kg; 3/28 75.4 kg; 3/29 76 kg    Labs as of 3/29 @ 0316:  K 3.6 wnl Phos 2.6 wnl Mg 1.9 wnl  I/O's past 24 hrs: -50 mL  Medications:  Insulin MS, protonix, KCl, IV glucagon    NUTRITION - FOCUSED PHYSICAL EXAM:    Most Recent Value  Orbital Region  Mild depletion  Upper Arm Region  Moderate depletion  Thoracic and Lumbar Region  No depletion [pt unable to sit up]  Buccal Region  Moderate depletion  Temple Region  Moderate depletion  Clavicle Bone Region  Moderate depletion  Clavicle and Acromion Bone Region  Moderate depletion  Scapular Bone Region  Mild depletion  Dorsal Hand  No depletion  Patellar Region  Severe depletion  Anterior Thigh Region  Moderate  depletion  Posterior Calf Region  Moderate depletion  Edema (RD Assessment)  None  Hair  Reviewed  Eyes  Reviewed  Mouth  Reviewed  Skin  Reviewed  Nails  Reviewed       Diet Order:  Diet NPO time specified  EDUCATION NEEDS:   No education needs have been identified at this time  Skin:  Skin Assessment: Skin Integrity Issues: Skin Integrity Issues:: Incisions Incisions: s/p PEG placement (abdomen)  Last BM:  3/27 (per pt)  Height:   Ht Readings from Last 1 Encounters:  06/10/17 6' (1.829 m)    Weight:   Wt Readings from Last 1 Encounters:   06/11/17 167 lb 8.8 oz (76 kg)      Ideal Body Weight:  80.91 kg  BMI:  Body mass index is 22.72 kg/m.  Estimated Nutritional Needs:   Kcal:  2100-2300 kcal (30-33 kcal/kg rounded)  Protein:  105-115 grams (20% kcal)  Fluid:  >=2.1 L/day    [place] Intern Pager: [phone]

## 2017-06-11 NOTE — Progress Notes (Signed)
PROGRESS NOTE    Vincent Hansen  OTL:[id number] DOB: Sep 28, 1945 DOA: 05/30/2017 PCP: Patient, No Pcp Per     Brief Narrative:  Vincent Hansen is a 72 year old with past medical history relevant for hypertension, COPD, type 2 diabetes, prior history of squamous cell carcinoma of the larynx admitted with hypoxia and found to have large laryngeal mass obstructing airway status post emergency tracheostomy placement with 6 Shiley tracheostomy on 05/30/2017 status post extubation on 05/31/2017. Due to continued aspiration risk, he is kept NPO and PEG was placed for nutrition. ENT planning for total laryngectomy next week.  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 Currently has a #6 XLT  -ENT planning for total laryngectomy at [facility] 4/3 Wednesday  -Will need dental follow up for periodontal therapy prior to start of chemoradiation therapy. Recommend orthopantogram once transferred to [facility] -Oncology following for adjuvant systemic therapy and radiation post-surgery recovery  -PEG placed 3/27, tube feeding initiated. Monitor electrolytes.   Hiatal hernia -CT of the abdomen showed concern for possible gastric adenocarcinoma versus hiatal hernia -S/p EGD 06/07/2017 and showed no evidence of gastric adenocarcinoma but did show medium sized hiatal hernia   Hypokalemia -Replace today, trend   Hypertension -Continue as needed labetalol every 2 hours IV -BP remains  stable   Type 2 diabetes -SSI  -Blood sugar remains stable   COPD -Without exacerbation. Continue brovana, pulmicort    DVT prophylaxis: SCD Code Status: Full Family Communication: No family at bedside Disposition Plan: Started on tube feeding 3/27. Replace electrolytes. Planning for laryngectomy next week. Will need SNF at discharge post-op.    Consultants:   ENT  IR  GI  Oncology  Procedures:   Emergent trach and laryngoscopy 3/17 by Dr. Constance Holster   EGD 3/25   Antimicrobials:  Anti-infectives (From admission, onward)   Start     Dose/Rate Route Frequency Ordered Stop   06/09/17 1500  ceFAZolin (ANCEF) IVPB 2g/100 mL premix     2 g 200 mL/hr over 30 Minutes Intravenous To Radiology 06/08/17 1427 06/09/17 1410       Subjective: Able to communicate by writing.  Complains of some abdominal soreness at PEG tube site.  Asks "Am I eating?" and asks when he will be able to get off the machine feeding. Complains of generalized arthritis pain.   Objective: Vitals:   06/11/17 0758 06/11/17 0800 06/11/17 0900 06/11/17 1000  BP:  128/81 120/78 120/74  Pulse:      Resp:  (!) '25 17 18  ' Temp: 98.4 F (36.9 C)     TempSrc: Oral     SpO2:  99% 97% 97%  Weight:      Height:        Intake/Output Summary (Last 24 hours) at 06/11/2017 1137 Last data filed at 06/11/2017 0900 Gross per 24 hour  Intake 300 ml  Output 250 ml  Net 50 ml   Filed Weights   06/09/17 1500 06/10/17 2000 06/11/17 0425  Weight:  74.8 kg (164 lb 14.5 oz) 75.4 kg (166 lb 3.6 oz) 76 kg (167 lb 8.8 oz)   Examination: General exam: Appears calm and comfortable  Respiratory system: Clear to auscultation. Respiratory effort normal. +trach Cardiovascular system: S1 & S2 heard, RRR. No JVD, murmurs, rubs, gallops or clicks. No pedal edema. Gastrointestinal system: Abdomen is nondistended, soft. No organomegaly or masses felt. Normal bowel sounds heard. +TTP PEG site Central nervous system: Alert and  oriented. No focal neurological deficits. Extremities: Symmetric 5 x 5 power. Skin: No rashes, lesions or ulcers Psychiatry: Judgement and insight appear normal. Mood & affect appropriate.    Data Reviewed: I have personally reviewed following labs and imaging studies  CBC: Recent Labs  Lab 06/04/17 2043 06/07/17 0501 06/09/17 0526 06/10/17 0331 06/11/17 0316  WBC 11.9* 6.9 7.1 9.3 7.8  NEUTROABS  --   --  6.0  --   --   HGB 14.8 12.8* 12.3* 13.1 11.8*  HCT 46.8 41.4 39.2 39.9 36.7*  MCV 87.3 87.3 85.6 83.8 84.0  PLT 302 240 251 244 248   Basic Metabolic Panel: Recent Labs  Lab 06/04/17 2043  06/07/17 0501 06/08/17 0556 06/09/17 0526 06/10/17 0331 06/11/17 0316  NA 152*   < > 146* 145 143 143 143  K 5.0   < > 3.4* 3.9 3.1* 3.4* 3.6  CL 105   < > 108 109 104 102 104  CO2 25   < > '29 30 28 29 ' 32  GLUCOSE 174*   < > 180* 161* 174* 125* 166*  BUN 39*   < > 10 8 5* 6 13  CREATININE 1.56*   < > 1.09 0.93 0.91 1.09 1.16  CALCIUM 10.2   < > 9.1 8.9 8.9 8.8* 8.8*  MG 2.0  --  1.5* 1.8  --  1.4* 1.9  PHOS 3.7  --   --   --   --  2.2* 2.6   < > = values in this interval not displayed.   GFR: Estimated Creatinine Clearance: 62.8 mL/min (by C-G formula based on SCr of 1.16 mg/dL). Liver Function Tests: Recent Labs  Lab 06/04/17 2043 06/09/17 0526  AST 19 18  ALT 27 19  ALKPHOS 76 58  BILITOT 1.4* 0.8  PROT 7.7 6.1*  ALBUMIN 3.7 2.8*   No results for input(s): LIPASE, AMYLASE in the last 168 hours. No results for input(s): AMMONIA in the last 168 hours. Coagulation Profile: Recent Labs  Lab 06/09/17 0526  INR 1.14   Cardiac Enzymes: No results for input(s): CKTOTAL, CKMB, CKMBINDEX, TROPONINI in the last 168 hours. BNP (last 3 results) No results for input(s): PROBNP in the last 8760 hours. HbA1C: No results for input(s): HGBA1C in the last 72 hours. CBG: Recent Labs  Lab 06/10/17 1118 06/10/17 1630 06/10/17 1934 06/10/17 2336 06/11/17 0332  GLUCAP  188* 195* 157* 164* 163*   Lipid Profile: No results for input(s): CHOL, HDL, LDLCALC, TRIG, CHOLHDL, LDLDIRECT in the last 72 hours. Thyroid Function Tests: No results for input(s): TSH, T4TOTAL, FREET4, T3FREE, THYROIDAB in the last 72 hours. Anemia Panel: No results for input(s): VITAMINB12, FOLATE, FERRITIN, TIBC, IRON, RETICCTPCT in the last 72 hours. Sepsis Labs: No results for input(s): PROCALCITON, LATICACIDVEN in the last 168 hours.  No results found for this or any previous visit (from the past 240 hour(s)).     Radiology Studies: Ir Gastrostomy Tube Mod Sed  Result Date: 06/09/2017 INDICATION: Laryngeal CA, dysphagia EXAM: FLUOROSCOPIC 20 FRENCH PULL-THROUGH  GASTROSTOMY Date:  06/09/2017 06/09/2017 2:01 pm Radiologist:  M. Daryll Brod, MD Guidance:  Fluoroscopic MEDICATIONS: Ancef 2 g; Antibiotics were administered within 1 hour of the procedure. Glucagon 0.5 mg IV ANESTHESIA/SEDATION: Versed 1 mg IV; Fentanyl 50 mcg IV Moderate Sedation Time:  15 minutes The patient was continuously monitored during the procedure by the interventional radiology nurse under my direct supervision. CONTRAST:  41m ISOVUE-300 IOPAMIDOL (ISOVUE-300) INJECTION 61% - administered into the gastric lumen. FLUOROSCOPY TIME:  Fluoroscopy Time: 2 minutes 18 seconds (18 mGy). COMPLICATIONS: None immediate. PROCEDURE: Informed consent was obtained from the patient following explanation of the procedure, risks, benefits and alternatives. The patient understands, agrees and consents for the procedure. All questions were addressed. A time out was performed. Maximal barrier sterile technique utilized including caps, mask, sterile gowns, sterile gloves, large sterile drape, hand hygiene, and betadine prep. The left upper quadrant was sterilely prepped and draped. An oral gastric catheter was inserted into the stomach under fluoroscopy. The existing nasogastric feeding tube was removed. Air was injected into the stomach for  insufflation and visualization under fluoroscopy. The air distended stomach was confirmed beneath the anterior abdominal wall in the frontal and lateral projections. Under sterile conditions and local anesthesia, a 165gauge trocar needle was utilized to access the stomach percutaneously beneath the left subcostal margin. Needle position was confirmed within the stomach under biplane fluoroscopy. Contrast injection confirmed position also. A single T tack was deployed for gastropexy. Over an Amplatz guide wire, a 9-French sheath was inserted into the stomach. A snare device was utilized to capture the oral gastric catheter. The snare device was pulled retrograde from the stomach up the esophagus and out the oropharynx. The 20-French pull-through gastrostomy was connected to the snare device and pulled antegrade through the oropharynx down the esophagus into the stomach and then through the percutaneous tract external to the patient. The gastrostomy was assembled externally. Contrast injection confirms position in the stomach. Images were obtained for documentation. The patient tolerated procedure well. No immediate complication. IMPRESSION: Fluoroscopic insertion of a 20-French "pull-through" gastrostomy. Electronically Signed   By: MJerilynn Mages  Shick M.D.   On: 06/09/2017 14:10   Dg Chest Port 1 View  Result Date: 06/09/2017 CLINICAL DATA:  Respiratory distress, history of respiratory failure, laryngeal malignancy, former smoker. EXAM: PORTABLE CHEST 1 VIEW COMPARISON:  Portable chest x-ray of June 01, 2017 FINDINGS: The lungs are well-expanded. There is no focal infiltrate. The tracheostomy tube tip projects at the inferior margin of the clavicular heads. The heart and pulmonary vascularity are normal. There is calcification in the wall of the aortic arch. The bony thorax exhibits no acute abnormality. IMPRESSION: Chronic bronchitic-smoking related changes, stable. No acute pneumonia nor CHF. Electronically Signed    By: David  JMartiniqueM.D.   On: 06/09/2017 15:18      Scheduled Meds: . arformoterol  15 mcg Nebulization BID  . budesonide (PULMICORT) nebulizer solution  0.5 mg Nebulization BID  . chlorhexidine gluconate (MEDLINE KIT)  15 mL Mouth Rinse BID  . feeding supplement (JEVITY 1.5 CAL/FIBER)  1,000 mL Per Tube Q24H  . feeding supplement (PRO-STAT SUGAR FREE 64)  30 mL Per Tube Daily  . free water  100 mL Per Tube Q4H  . insulin aspart  0-15 Units Subcutaneous Q4H  . mouth rinse  15 mL Mouth Rinse QID  . pantoprazole sodium  40 mg Per Tube Daily  . scopolamine  1 patch Transdermal Q72H   Continuous Infusions:    LOS:  12 days    Time spent: 20 minutes   Dessa Phi, DO [facility] [email or web address] Password [facility] 06/11/2017, 11:37 AM

## 2017-06-12 LAB — GLUCOSE, CAPILLARY
GLUCOSE-CAPILLARY: 142 mg/dL — AB (ref 65–99)
GLUCOSE-CAPILLARY: 156 mg/dL — AB (ref 65–99)
Glucose-Capillary: 131 mg/dL — ABNORMAL HIGH (ref 65–99)
Glucose-Capillary: 140 mg/dL — ABNORMAL HIGH (ref 65–99)
Glucose-Capillary: 149 mg/dL — ABNORMAL HIGH (ref 65–99)
Glucose-Capillary: 171 mg/dL — ABNORMAL HIGH (ref 65–99)

## 2017-06-12 LAB — BASIC METABOLIC PANEL
Anion gap: 8 (ref 5–15)
BUN: 16 mg/dL (ref 6–20)
CALCIUM: 9.2 mg/dL (ref 8.9–10.3)
CO2: 35 mmol/L — ABNORMAL HIGH (ref 22–32)
CREATININE: 1.23 mg/dL (ref 0.61–1.24)
Chloride: 103 mmol/L (ref 101–111)
GFR, EST NON AFRICAN AMERICAN: 57 mL/min — AB (ref >60)
Glucose, Bld: 137 mg/dL — ABNORMAL HIGH (ref 65–99)
Potassium: 3.9 mmol/L (ref 3.5–5.1)
SODIUM: 146 mmol/L — AB (ref 135–145)

## 2017-06-12 LAB — CBC
HCT: 39.1 % (ref 39.0–52.0)
Hemoglobin: 12.2 g/dL — ABNORMAL LOW (ref 13.0–17.0)
MCH: 27.1 pg (ref 26.0–34.0)
MCHC: 31.2 g/dL (ref 30.0–36.0)
MCV: 86.7 fL (ref 78.0–100.0)
PLATELETS: 265 10*3/uL (ref 150–400)
RBC: 4.51 MIL/uL (ref 4.22–5.81)
RDW: 13.8 % (ref 11.5–15.5)
WBC: 8.1 10*3/uL (ref 4.0–10.5)

## 2017-06-12 LAB — PHOSPHORUS: PHOSPHORUS: 2.4 mg/dL — AB (ref 2.5–4.6)

## 2017-06-12 LAB — MAGNESIUM: MAGNESIUM: 1.7 mg/dL (ref 1.7–2.4)

## 2017-06-12 NOTE — Progress Notes (Signed)
PROGRESS NOTE    Vincent Hansen  UVO:[id number] DOB: 1946-03-09 DOA: 05/30/2017 PCP: Patient, No Pcp Per     Brief Narrative:  Vincent Hansen is a 72 year old with past medical history relevant for hypertension, COPD, type 2 diabetes, prior history of squamous cell carcinoma of the larynx admitted with hypoxia and found to have large laryngeal mass obstructing airway status post emergency tracheostomy placement with 6 Shiley tracheostomy on 05/30/2017 status post extubation on 05/31/2017. Due to continued aspiration risk, he is kept NPO and PEG was placed for nutrition. ENT planning for total laryngectomy next week.  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 Currently has a #6 XLT  -ENT planning for total laryngectomy at [facility] 4/3 Wednesday  -Will need dental follow up for periodontal therapy prior to start of chemoradiation therapy. Recommend orthopantogram once transferred to [facility] -Oncology following for adjuvant systemic therapy and radiation post-surgery recovery  -PEG placed 3/27, tube feeding initiated. Monitor electrolytes.   Hiatal hernia -CT of the abdomen showed concern for possible gastric adenocarcinoma versus hiatal hernia -S/p EGD 06/07/2017 and showed no evidence of gastric adenocarcinoma but did show medium sized hiatal hernia   Hypertension -Continue as needed labetalol every 2 hours IV -BP stable today   Type 2 diabetes -SSI  -BS  stable   COPD -Without exacerbation. Continue brovana, pulmicort    DVT prophylaxis: SCD Code Status: Full Family Communication: No family at bedside Disposition Plan: Started on tube feeding 3/27. Replace electrolytes. Planning for laryngectomy next week. Will need SNF at discharge post-op.     Consultants:   ENT  IR  GI  Oncology  Procedures:   Emergent trach and laryngoscopy 3/17 by Dr. Constance Holster   EGD 3/25   Antimicrobials:  Anti-infectives (From admission, onward)   Start     Dose/Rate Route Frequency Ordered Stop   06/09/17 1500  ceFAZolin (ANCEF) IVPB 2g/100 mL premix     2 g 200 mL/hr over 30 Minutes Intravenous To Radiology 06/08/17 1427 06/09/17 1410       Subjective: Able to communicate by writing. Asking for his meds, insulin. He writes "6:30" and points to the clock repeatedly. It is currently 10:30am. He has been pressing RN button repeatedly, even with myself examining him in the room. RN arrived to the room with his morning medications.   Objective: Vitals:   06/12/17 0418 06/12/17 0802 06/12/17 1154 06/12/17 1248  BP: 135/89   124/70  Pulse: 78 (!) 57 63 69  Resp: '20 20 17   ' Temp: 98.9 F (37.2 C)   98.8 F (37.1 C)  TempSrc: Oral   Oral  SpO2: 95% 94% 98% 98%  Weight:      Height:        Intake/Output Summary (Last 24 hours) at 06/12/2017 1519 Last data filed at 06/11/2017 1846 Gross per 24 hour  Intake 110 ml  Output 300 ml  Net -190 ml   Filed Weights   06/09/17 1500 06/10/17  2000 06/11/17 0425  Weight: 74.8 kg (164 lb 14.5 oz) 75.4 kg (166 lb 3.6 oz) 76 kg (167 lb 8.8 oz)   Examination: General exam: Appears comfortable, frustrated  Respiratory system: +trach  Did not allow me to do full exam this morning    Data Reviewed: I have personally reviewed following labs and imaging studies  CBC: Recent Labs  Lab 06/07/17 0501 06/09/17 0526 06/10/17 0331 06/11/17 0316 06/12/17 0501  WBC 6.9 7.1 9.3 7.8 8.1  NEUTROABS  --   6.0  --   --   --   HGB 12.8* 12.3* 13.1 11.8* 12.2*  HCT 41.4 39.2 39.9 36.7* 39.1  MCV 87.3 85.6 83.8 84.0 86.7  PLT 240 251 244 229 671   Basic Metabolic Panel: Recent Labs  Lab 06/07/17 0501 06/08/17 0556 06/09/17 0526 06/10/17 0331 06/11/17 0316 06/12/17 0501  NA 146* 145 143 143 143 146*  K 3.4* 3.9 3.1* 3.4* 3.6 3.9  CL 108 109 104 102 104 103  CO2 '29 30 28 29 ' 32 35*  GLUCOSE 180* 161* 174* 125* 166* 137*  BUN 10 8 5* '6 13 16  ' CREATININE 1.09 0.93 0.91 1.09 1.16 1.23  CALCIUM 9.1 8.9 8.9 8.8* 8.8* 9.2  MG 1.5* 1.8  --  1.4* 1.9 1.7  PHOS  --   --   --  2.2* 2.6 2.4*   GFR: Estimated Creatinine Clearance: 59.2 mL/min (by C-G formula based on SCr of 1.23 mg/dL). Liver Function Tests: Recent Labs  Lab 06/09/17 0526  AST 18  ALT 19  ALKPHOS 58  BILITOT 0.8  PROT 6.1*  ALBUMIN 2.8*   No results for input(s): LIPASE, AMYLASE in the last 168 hours. No results for input(s): AMMONIA in the last 168 hours. Coagulation Profile: Recent Labs  Lab 06/09/17 0526  INR 1.14   Cardiac Enzymes: No results for input(s): CKTOTAL, CKMB, CKMBINDEX, TROPONINI in the last 168 hours. BNP (last 3 results) No results for input(s): PROBNP in the last 8760 hours. HbA1C: No results for input(s): HGBA1C in the last 72 hours. CBG: Recent Labs  Lab 06/11/17 2027 06/12/17 0002 06/12/17 0415 06/12/17 0747 06/12/17 1228  GLUCAP 167* 171* 140* 149* 142*   Lipid Profile: No results for input(s): CHOL, HDL, LDLCALC, TRIG, CHOLHDL, LDLDIRECT in the last 72 hours. Thyroid Function Tests: No results for input(s): TSH, T4TOTAL, FREET4, T3FREE, THYROIDAB in the last 72 hours. Anemia Panel: No results for input(s): VITAMINB12, FOLATE, FERRITIN, TIBC, IRON, RETICCTPCT in the last 72 hours. Sepsis Labs: No results for input(s): PROCALCITON, LATICACIDVEN in the last 168 hours.  No results found for this or any previous visit (from the past 240 hour(s)).     Radiology Studies: No  results found.    Scheduled Meds: . arformoterol  15 mcg Nebulization BID  . budesonide (PULMICORT) nebulizer solution  0.5 mg Nebulization BID  . chlorhexidine gluconate (MEDLINE KIT)  15 mL Mouth Rinse BID  . feeding supplement (JEVITY 1.5 CAL/FIBER)  1,000 mL Per Tube Q24H  . feeding supplement (PRO-STAT SUGAR FREE 64)  30 mL Per Tube Daily  . free water  100 mL Per Tube Q4H  . insulin aspart  0-15 Units Subcutaneous Q4H  . mouth rinse  15 mL Mouth Rinse QID  . pantoprazole sodium  40 mg Per Tube Daily  . scopolamine  1 patch Transdermal Q72H   Continuous Infusions:    LOS: 13 days    Time spent: 15 minutes   Dessa Phi, DO  [facility] [email or web address] Password [facility] 06/12/2017, 3:19 PM

## 2017-06-12 NOTE — Progress Notes (Signed)
0 residual noted to PEG. Increased TF to 31ml/hr. Tolerating well with no c/o abd discomfort

## 2017-06-12 NOTE — Progress Notes (Signed)
PT trying to communicate but becoming frustrated. Requesting to rinse mouth with water. Performed mouth care and let pt rinse mouth. Pt in better mood but refused assessment of abdomen. When I tried to move gown, pt snatched gown out of my hand and refused. ADB soft and does not appear distended. Tolerating TF well

## 2017-06-13 LAB — GLUCOSE, CAPILLARY
GLUCOSE-CAPILLARY: 210 mg/dL — AB (ref 65–99)
GLUCOSE-CAPILLARY: 190 mg/dL — AB (ref 65–99)
GLUCOSE-CAPILLARY: 237 mg/dL — AB (ref 65–99)
Glucose-Capillary: 191 mg/dL — ABNORMAL HIGH (ref 65–99)
Glucose-Capillary: 212 mg/dL — ABNORMAL HIGH (ref 65–99)
Glucose-Capillary: 227 mg/dL — ABNORMAL HIGH (ref 65–99)

## 2017-06-13 LAB — BASIC METABOLIC PANEL
Anion gap: 9 (ref 5–15)
BUN: 12 mg/dL (ref 6–20)
CO2: 32 mmol/L (ref 22–32)
Calcium: 9 mg/dL (ref 8.9–10.3)
Chloride: 102 mmol/L (ref 101–111)
Creatinine, Ser: 0.98 mg/dL (ref 0.61–1.24)
GFR calc Af Amer: >60 mL/min (ref >60)
GLUCOSE: 211 mg/dL — AB (ref 65–99)
POTASSIUM: 3.8 mmol/L (ref 3.5–5.1)
Sodium: 143 mmol/L (ref 135–145)

## 2017-06-13 LAB — PHOSPHORUS: Phosphorus: 2.2 mg/dL — ABNORMAL LOW (ref 2.5–4.6)

## 2017-06-13 LAB — CBC
HCT: 36.2 % — ABNORMAL LOW (ref 39.0–52.0)
Hemoglobin: 11.6 g/dL — ABNORMAL LOW (ref 13.0–17.0)
MCH: 27.5 pg (ref 26.0–34.0)
MCHC: 32 g/dL (ref 30.0–36.0)
MCV: 85.8 fL (ref 78.0–100.0)
PLATELETS: 232 10*3/uL (ref 150–400)
RBC: 4.22 MIL/uL (ref 4.22–5.81)
RDW: 13.7 % (ref 11.5–15.5)
WBC: 5.9 10*3/uL (ref 4.0–10.5)

## 2017-06-13 LAB — MAGNESIUM: Magnesium: 1.6 mg/dL — ABNORMAL LOW (ref 1.7–2.4)

## 2017-06-13 MED ORDER — LIP MEDEX EX OINT
TOPICAL_OINTMENT | CUTANEOUS | Status: DC | PRN
  Filled 2017-06-13: qty 7

## 2017-06-13 MED ORDER — MAGNESIUM SULFATE 2 GM/50ML IV SOLN
2.0000 g | Freq: Once | INTRAVENOUS | Status: AC
  Administered 2017-06-13: 2 g via INTRAVENOUS
  Filled 2017-06-13: qty 50

## 2017-06-13 MED ORDER — K PHOS MONO-SOD PHOS DI & MONO 155-852-130 MG PO TABS
250.0000 mg | ORAL_TABLET | Freq: Once | ORAL | Status: AC
Start: 2017-06-13 — End: 2017-06-13
  Administered 2017-06-13: 250 mg
  Filled 2017-06-13: qty 1

## 2017-06-13 MED ORDER — LIP MEDEX EX OINT
TOPICAL_OINTMENT | CUTANEOUS | Status: AC
  Administered 2017-06-13: 02:00:00
  Filled 2017-06-13: qty 7

## 2017-06-13 MED ORDER — INSULIN GLARGINE 100 UNIT/ML ~~LOC~~ SOLN
10.0000 [IU] | Freq: Every day | SUBCUTANEOUS | Status: DC
  Administered 2017-06-13 – 2017-06-20 (×8): 10 [IU] via SUBCUTANEOUS
  Filled 2017-06-13 (×10): qty 0.1

## 2017-06-13 NOTE — Progress Notes (Signed)
SLP Cancellation Note  Patient Details Name: Vincent Hansen MRN: [id number] DOB: 1945-03-29   Cancelled treatment:       Reason Eval/Treat Not Completed: Other (comment). New orders received; per report pt not adhering to swish and spit; RN states pt coughing and with tracheal expectoration when swishing. Advised to refrain from swish/spit, increase frequency of oral care with suction toothettes. Will re-assess next date.  Deneise Lever, [place], CCC-SLP Speech-Language Pathologist [phone]   [name] 06/13/2017, 9:51 AM

## 2017-06-13 NOTE — Progress Notes (Addendum)
   Patient calling in for RN every 30 minutes to be suctioned. He writes that he does not want to see me, but rather a head doctor, a doctor of medicine. He wants to know how fast he can get to [facility]. Per review of Dr. Constance Holster (ENT) note, Dr. Constance Holster had offered patient to be transferred to [facility] but patient had declined and wanted to stay in [facility] for his care. That is why his laryngectomy was scheduled for Wednesday at [facility]. I brought this up to patient and he writes that is "hear-say." Currently, there is no plan to be transferred to [facility]. I told patient that if he would like to transfer there instead of having surgery with Dr. Jessie Foot, he needs to make that clear with [place] and discuss further with Dr. Constance Holster. He then waved me away.     Dessa Phi, DO [facility] [email or web address] Password [email or web address] 06/13/2017, 2:30 PM

## 2017-06-13 NOTE — Progress Notes (Signed)
Changed inner cannula at 0800 and 1700. Pt tolerated well

## 2017-06-13 NOTE — Progress Notes (Signed)
Nutrition Brief Follow-up  Tube feeding of Jevity 1.5 is currently running at 30 ml/hr, pt tolerating. Per chart review, pt has been drinking oral care fluid today.  At 1800, TF expected to advance to 40 ml/hr. Will continue to monitor labs as pt is at risk of refeeding syndrome. As of today, Mg and Phos labs are low. Pt received IV Mg sulfate today and K-Phos per tube.  If further nutrition issues arise, please consult RD.   Clayton Bibles, MS, RD, [place] Dietitian Pager: [phone] After Hours Pager: [phone]

## 2017-06-13 NOTE — Progress Notes (Signed)
Pt noted to be drinking oral care fluid instead of swish and spitting which leads pt need to be deep suctioned multiple times.  Much education given to pt with teach back but pt continues to drink 75%.  Now all mouth care fully supervised and using oral sponge only.

## 2017-06-13 NOTE — Progress Notes (Signed)
PROGRESS NOTE    Vincent Hansen  SJG:[id number] DOB: [birth year] DOA: 05/30/2017 PCP: Patient, No Pcp Per     Brief Narrative:  Vincent Hansen is a 72 year old with past medical history relevant for hypertension, COPD, type 2 diabetes, prior history of squamous cell carcinoma of the larynx admitted with hypoxia and found to have large laryngeal mass obstructing airway status post emergency tracheostomy placement with 6 Shiley tracheostomy on 05/30/2017 status post extubation on 05/31/2017. Due to continued aspiration risk, he is kept NPO and PEG was placed for nutrition. ENT planning for total laryngectomy next week.  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 Currently has a #6 XLT  -ENT planning for total laryngectomy at [facility] 4/3 Wednesday  -Will need dental follow up for periodontal therapy prior to start of chemoradiation therapy. Recommend orthopantogram once transferred to [facility] -Oncology following for adjuvant systemic therapy and radiation post-surgery recovery  -PEG placed 3/27, tube feeding initiated. Monitor electrolytes.   Hiatal hernia -CT of the abdomen showed concern for possible gastric adenocarcinoma versus hiatal hernia -S/p EGD 06/07/2017 and showed no evidence of gastric adenocarcinoma but did show medium sized hiatal hernia   Hypertension -Continue as needed labetalol every 2 hours IV -BP remains stable   Type 2 diabetes -SSI    -Lantus added   COPD -Without exacerbation. Continue brovana, pulmicort    DVT prophylaxis: SCD Code Status: Full Family Communication: No family at bedside Disposition Plan: Started on tube feeding 3/27. Replace electrolytes. Planning for laryngectomy next week. Will need SNF at discharge post-op.     Consultants:   ENT  IR  GI  Oncology  Procedures:   Emergent trach and laryngoscopy 3/17 by Dr. Constance Holster   EGD 3/25   Antimicrobials:  Anti-infectives (From admission, onward)   Start     Dose/Rate Route Frequency Ordered Stop   06/09/17 1500  ceFAZolin (ANCEF) IVPB 2g/100 mL premix     2 g 200 mL/hr over 30 Minutes Intravenous To Radiology 06/08/17 1427 06/09/17 1410       Subjective: Able to communicate by writing. Per RN, patient continued to be nonadherent to swish and spit for oral care. After oral care, he will swallow the fluid which requires large amount of suctioning. I discussed with him today that due to his safety, we will have supervised oral care only. He was very upset with this news and stated he wanted a new doctor. I discussed with him [facility] no firing policy and that a new doctor would not change this decision. He is due for transfer to [facility] in the next 1-2 days and will see a new physician at that time. I offered him to see SLP again. He specifically asks for the therapist who wrote a note saying he could swish and spit but I did not know who was on call for SLP team.   Objective: Vitals:   06/13/17 0023 06/13/17 0315 06/13/17 0343 06/13/17 0804  BP:  Pulse:      Resp:      Temp:      TempSrc:      SpO2: 97% 95%  97%  Weight:   72.2 kg (159 lb 2.8 oz)   Height:        Intake/Output Summary (Last 24 hours) at 06/13/2017 1237 Last data filed at 06/12/2017 2200 Gross per 24 hour  Intake 190 ml  Output 350 ml  Net -160 ml   Filed Weights   06/10/17 2000 06/11/17 0425 06/13/17 0343  Weight: 75.4 kg (166 lb 3.6 oz) 76 kg (167  lb 8.8 oz) 72.2 kg (159 lb 2.8 oz)   Examination: General exam: Appears comfortable, frustrated  Respiratory system: +trach    Data Reviewed: I have personally reviewed following labs and imaging studies  CBC: Recent Labs  Lab 06/09/17 0526 06/10/17 0331 06/11/17 0316 06/12/17 0501 06/13/17 0440  WBC 7.1 9.3 7.8 8.1 5.9  NEUTROABS 6.0  --   --   --   --   HGB 12.3* 13.1 11.8* 12.2* 11.6*  HCT 39.2 39.9 36.7* 39.1 36.2*  MCV 85.6 83.8 84.0 86.7 85.8  PLT 251 244 229 265 263   Basic Metabolic Panel: Recent Labs  Lab 06/08/17 0556 06/09/17 0526 06/10/17 0331 06/11/17 0316 06/12/17 0501 06/13/17 0440  NA 145 143 143 143 146* 143  K 3.9 3.1* 3.4* 3.6 3.9 3.8  CL 109 104 102 104 103 102  CO2 '30 28 29 ' 32 35* 32  GLUCOSE 161* 174* 125* 166* 137* 211*  BUN 8 5* '6 13 16 12  ' CREATININE 0.93 0.91 1.09 1.16 1.23 0.98  CALCIUM 8.9 8.9 8.8* 8.8* 9.2 9.0  MG 1.8  --  1.4* 1.9 1.7 1.6*  PHOS  --   --  2.2* 2.6 2.4* 2.2*   GFR: Estimated Creatinine Clearance: 70.6 mL/min (by C-G formula based on SCr of 0.98 mg/dL). Liver Function Tests: Recent Labs  Lab 06/09/17 0526  AST 18  ALT 19  ALKPHOS 58  BILITOT 0.8  PROT 6.1*  ALBUMIN 2.8*   No results for input(s): LIPASE, AMYLASE in the last 168 hours. No results for input(s): AMMONIA in the last 168 hours. Coagulation Profile: Recent Labs  Lab 06/09/17 0526  INR 1.14   Cardiac Enzymes: No results for input(s): CKTOTAL, CKMB, CKMBINDEX, TROPONINI in the last 168 hours. BNP (last 3 results) No results for input(s): PROBNP in the last 8760 hours. HbA1C: No results for input(s): HGBA1C in the last 72 hours. CBG: Recent Labs  Lab 06/12/17 2003 06/13/17 0008 06/13/17 0339 06/13/17 0743 06/13/17 1132  GLUCAP 156* 227* 191* 212* 210*   Lipid Profile: No results for input(s): CHOL, HDL, LDLCALC, TRIG, CHOLHDL, LDLDIRECT in the last 72 hours. Thyroid Function Tests: No results for input(s): TSH, T4TOTAL, FREET4,  T3FREE, THYROIDAB in the last 72 hours. Anemia Panel: No results for input(s): VITAMINB12, FOLATE, FERRITIN, TIBC, IRON, RETICCTPCT in the last 72 hours. Sepsis Labs: No results for input(s): PROCALCITON, LATICACIDVEN in the last 168 hours.  No results found for this or any previous visit (from the past 240 hour(s)).     Radiology Studies: No results found.    Scheduled Meds: . arformoterol  15 mcg Nebulization BID  . budesonide (PULMICORT) nebulizer solution  0.5 mg Nebulization BID  . chlorhexidine gluconate (MEDLINE KIT)  15 mL Mouth Rinse BID  . feeding supplement (JEVITY 1.5 CAL/FIBER)  1,000 mL Per Tube Q24H  . feeding supplement (PRO-STAT SUGAR FREE 64)  30 mL Per Tube Daily  . free water  100 mL Per Tube Q4H  . insulin aspart  0-15 Units Subcutaneous Q4H  . insulin glargine  10 Units Subcutaneous QHS  . mouth rinse  15 mL Mouth Rinse QID  . pantoprazole sodium  40 mg Per Tube Daily  . scopolamine  1 patch Transdermal Q72H   Continuous Infusions:    LOS: 14 days    Time spent: 20 minutes   Dessa Phi, DO [facility] [email or web address] Password [facility] 06/13/2017, 12:37 PM

## 2017-06-14 LAB — CBC
HCT: 35.9 % — ABNORMAL LOW (ref 39.0–52.0)
HEMOGLOBIN: 11.2 g/dL — AB (ref 13.0–17.0)
MCH: 26.9 pg (ref 26.0–34.0)
MCHC: 31.2 g/dL (ref 30.0–36.0)
MCV: 86.1 fL (ref 78.0–100.0)
PLATELETS: 244 10*3/uL (ref 150–400)
RBC: 4.17 MIL/uL — AB (ref 4.22–5.81)
RDW: 13.6 % (ref 11.5–15.5)
WBC: 5.1 10*3/uL (ref 4.0–10.5)

## 2017-06-14 LAB — GLUCOSE, CAPILLARY
GLUCOSE-CAPILLARY: 154 mg/dL — AB (ref 65–99)
Glucose-Capillary: 175 mg/dL — ABNORMAL HIGH (ref 65–99)
Glucose-Capillary: 194 mg/dL — ABNORMAL HIGH (ref 65–99)
Glucose-Capillary: 205 mg/dL — ABNORMAL HIGH (ref 65–99)
Glucose-Capillary: 213 mg/dL — ABNORMAL HIGH (ref 65–99)
Glucose-Capillary: 216 mg/dL — ABNORMAL HIGH (ref 65–99)
Glucose-Capillary: 243 mg/dL — ABNORMAL HIGH (ref 65–99)

## 2017-06-14 LAB — BASIC METABOLIC PANEL
Anion gap: 8 (ref 5–15)
BUN: 11 mg/dL (ref 6–20)
CHLORIDE: 100 mmol/L — AB (ref 101–111)
CO2: 35 mmol/L — ABNORMAL HIGH (ref 22–32)
CREATININE: 1.01 mg/dL (ref 0.61–1.24)
Calcium: 8.8 mg/dL — ABNORMAL LOW (ref 8.9–10.3)
Glucose, Bld: 191 mg/dL — ABNORMAL HIGH (ref 65–99)
POTASSIUM: 3.6 mmol/L (ref 3.5–5.1)
SODIUM: 143 mmol/L (ref 135–145)

## 2017-06-14 LAB — MAGNESIUM: MAGNESIUM: 1.7 mg/dL (ref 1.7–2.4)

## 2017-06-14 LAB — PHOSPHORUS: PHOSPHORUS: 2.7 mg/dL (ref 2.5–4.6)

## 2017-06-14 MED ORDER — JEVITY 1.5 CAL/FIBER PO LIQD
1000.0000 mL | ORAL | Status: DC
  Administered 2017-06-14 – 2017-06-18 (×4): 1000 mL
  Filled 2017-06-14 (×9): qty 1000

## 2017-06-14 MED ORDER — FREE WATER
100.0000 mL | Freq: Three times a day (TID) | Status: DC
  Administered 2017-06-14 – 2017-06-20 (×15): 100 mL

## 2017-06-14 NOTE — Progress Notes (Addendum)
  Speech Language Pathology Treatment:    Patient Details Name: Vincent Hansen MRN: [id number] DOB: [birth year] Today's Date: 06/14/2017 Time: 0962-8366 SLP Time Calculation (min) (ACUTE ONLY): 13 min  Assessment / Plan / Recommendation Clinical Impression  Pt has been seen for education re: laryngectomy. Today provided with Electrolarynx to "practice" prior to scheduled procedure 06/16/2017.  Pt reports frustration at not being able "to speak" - educated him that due to his copious secretions and ? impact of XL trach  - he has been unable to use PMSV. Did not attempt PMSV today due to copious white frothy secretions noted that pt coughs and expectorates.     Do not advise pt to eat or drink due to his level of dysphagia and copious aspiration of secretions.  Note he is planned for laryngectomy, therefore hopeful after laryngectomy performed and pt adequately healed that he may be able to eat/drink.    In addition, reeducated him to plan to have laryngectomy performed on Wednesday - therefore need to practice with electrolarynx.  Pt's has a rapid rate and imprecise speech at baseline and needed total cues to over-articulate single words with appropriate phrasing and initiation of EL vibration.  He will require extensive practice - provided him a list of single syllable words to start practicing.  Use of oral adaptor better for him than applying EL to submental musculature.  Encouraged him to continue to practice and covered device with clear plastic bag due to copious amounts of secretions.    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  Pt is s/p emergent trach and order for swallow/PMSV evaluations received. CXR showed Low lung volumes with areas of scarring and/or subsegmental atelectasis in the lower lobes of the lungs bilaterally.     Pt has been seen for education  re: laryngectomy. Today provided with Electrolarynx to "practice" prior to scheduled procedure 06/16/2017.  Pt reports frustration at not being able "to speak" - educated him that due to his copious secretions and ? impact of XL trach  - he has been unable to use PMSV.  In addition, reeducated him to plan to have laryngectomy performed on Wednesday - therefore need to practice with electrolarynx.        SLP Plan  Continue with current plan of care       Recommendations  Diet recommendations: NPO Medication Administration: Via alternative means                Oral Care Recommendations: Oral care QID Follow up Recommendations: (tbd) SLP Visit Diagnosis: Aphonia (R49.1);Other (comment)(laryngectomy planned for next week) Plan: Continue with current plan of care       [place], Vincent Hansen 06/14/2017, 1:45 PM Vincent Hansen, [place] [facility] SLP [phone]

## 2017-06-14 NOTE — H&P (View-Only) (Signed)
Patient ID: Vincent Hansen, male   DOB: Mar 08, 1946, 71 y.o.   MRN: [id number]  I came by to discuss his upcoming surgery which is scheduled for Wednesday.  He is agreeable to surgery.  The best I can tell he understands the nature of the surgery.  He has written down a few questions for me which I have tried to answer.  We will need to transfer him to [facility] sometime tomorrow in preparation for surgery on Wednesday.

## 2017-06-14 NOTE — Progress Notes (Signed)
PROGRESS NOTE    Vincent Hansen  ZRA:[id number] DOB: 09/12/1945 DOA: 05/30/2017 PCP: Patient, No Pcp Per     Brief Narrative:  Vincent Hansen is a 72 year old with past medical history relevant for hypertension, COPD, type 2 diabetes, prior history of squamous cell carcinoma of the larynx admitted with hypoxia and found to have large laryngeal mass obstructing airway status post emergency tracheostomy placement with 6 Shiley tracheostomy on 05/30/2017 status post extubation on 05/31/2017. Due to continued aspiration risk, he is kept NPO and PEG was placed for nutrition. ENT planning for total laryngectomy next week.  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 Currently has a #6 XLT  -ENT planning for total laryngectomy at [facility] 4/3 Wednesday  -Will need dental follow up for periodontal therapy prior to start of chemoradiation therapy. Recommend orthopantogram once transferred to [facility]  -Oncology following for adjuvant systemic therapy and radiation post-surgery recovery  -PEG placed 3/27, tube feeding initiated. Monitor electrolytes.   Hiatal hernia -CT of the abdomen showed concern for possible gastric adenocarcinoma versus hiatal hernia -S/p EGD 06/07/2017 and showed no evidence of gastric adenocarcinoma but did show medium sized hiatal hernia   Hypertension -Continue as needed labetalol every 2 hours IV -BP remains stable   Type 2 diabetes -SSI    -Lantus  -Blood sugar improved   COPD -Without exacerbation. Continue brovana, pulmicort    DVT prophylaxis: SCD Code Status: Full Family Communication: No family at bedside Disposition Plan: Started on tube feeding 3/27. Replace electrolytes. Planning for laryngectomy 4/3 at [facility] Will need SNF at discharge post-op.     Consultants:   ENT  IR  GI  Oncology  Procedures:   Emergent trach and laryngoscopy 3/17 by Dr. Constance Holster   EGD 3/25   Antimicrobials:  Anti-infectives (From admission, onward)   Start     Dose/Rate Route Frequency Ordered Stop   06/09/17 1500  ceFAZolin (ANCEF) IVPB 2g/100 mL premix     2 g 200 mL/hr over 30 Minutes Intravenous To Radiology 06/08/17 1427 06/09/17 1410       Subjective: Able to communicate by writing. No new issues.   Objective: Vitals:   06/14/17 0413 06/14/17 0518 06/14/17 0814 06/14/17 1219  BP:  130/66    Pulse:  66 82 92  Resp:  '18 18 18  ' Temp:  98.4 F (36.9 C)    TempSrc:  Oral    SpO2: 96% 96% 96% 97%  Weight:  73.9 kg (162 lb 14.7 oz)    Height:        Intake/Output Summary (Last 24 hours) at 06/14/2017 1251 Last data filed at 06/14/2017 0600 Gross per 24 hour  Intake 780 ml  Output 0 ml  Net 780 ml   Filed Weights   06/11/17 0425 06/13/17 0343 06/14/17 0518  Weight: 76 kg (167 lb 8.8 oz) 72.2 kg (159 lb 2.8 oz) 73.9 kg (162 lb 14.7 oz)   Examination: General exam: Appears calm and  comfortable  Respiratory system: Clear to auscultation. Respiratory effort normal. +trach with thick sputum  Cardiovascular system: S1 & S2 heard, RRR. No JVD, murmurs, rubs, gallops or clicks. No pedal edema. Gastrointestinal system: Abdomen is nondistended, soft and nontender. No organomegaly or masses felt. Normal bowel sounds heard. +PEG Central nervous system: Alert and oriented. No focal neurological deficits. Extremities: Symmetric 5 x 5 power. Skin: No rashes, lesions or ulcers Psychiatry: Judgement and insight  appear stable    Data Reviewed: I have personally reviewed following labs and imaging studies  CBC: Recent Labs  Lab 06/09/17 0526 06/10/17 0331 06/11/17 0316 06/12/17 0501 06/13/17 0440 06/14/17 0516  WBC 7.1 9.3 7.8 8.1 5.9 5.1  NEUTROABS 6.0  --   --   --   --   --   HGB 12.3* 13.1 11.8* 12.2* 11.6* 11.2*  HCT 39.2 39.9 36.7* 39.1 36.2* 35.9*  MCV 85.6 83.8 84.0 86.7 85.8 86.1  PLT 251 244 229 265 232 725   Basic Metabolic Panel: Recent Labs  Lab 06/10/17 0331 06/11/17 0316 06/12/17 0501 06/13/17 0440 06/14/17 0516  NA 143 143 146* 143 143  K 3.4* 3.6 3.9 3.8 3.6  CL 102 104 103 102 100*  CO2 29 32 35* 32 35*  GLUCOSE 125* 166* 137* 211* 191*  BUN '6 13 16 12 11  ' CREATININE 1.09 1.16 1.23 0.98 1.01  CALCIUM 8.8* 8.8* 9.2 9.0 8.8*  MG 1.4* 1.9 1.7 1.6* 1.7  PHOS 2.2* 2.6 2.4* 2.2* 2.7   GFR: Estimated Creatinine Clearance: 70.1 mL/min (by C-G formula based on SCr of 1.01 mg/dL). Liver Function Tests: Recent Labs  Lab 06/09/17 0526  AST 18  ALT 19  ALKPHOS 58  BILITOT 0.8  PROT 6.1*  ALBUMIN 2.8*   No results for input(s): LIPASE, AMYLASE in the last 168 hours. No results for input(s): AMMONIA in the last 168 hours. Coagulation Profile: Recent Labs  Lab 06/09/17 0526  INR 1.14   Cardiac Enzymes: No results for input(s): CKTOTAL, CKMB, CKMBINDEX, TROPONINI in the last 168 hours. BNP (last 3 results) No results for input(s): PROBNP in the last 8760 hours. HbA1C: No results for input(s): HGBA1C in the last 72 hours. CBG: Recent Labs  Lab 06/13/17 2003 06/14/17 0017 06/14/17 0515 06/14/17 0727 06/14/17 1149  GLUCAP 237* 216* 194* 175* 154*   Lipid Profile: No results for input(s): CHOL, HDL, LDLCALC, TRIG, CHOLHDL, LDLDIRECT in the last 72 hours. Thyroid Function Tests: No results for input(s): TSH, T4TOTAL, FREET4, T3FREE, THYROIDAB in the last 72 hours. Anemia Panel: No results for input(s): VITAMINB12, FOLATE, FERRITIN, TIBC, IRON,  RETICCTPCT in the last 72 hours. Sepsis Labs: No results for input(s): PROCALCITON, LATICACIDVEN in the last 168 hours.  No results found for this or any previous visit (from the past 240 hour(s)).     Radiology Studies: No results found.    Scheduled Meds: . arformoterol  15 mcg Nebulization BID  . budesonide (PULMICORT) nebulizer solution  0.5 mg Nebulization BID  . chlorhexidine gluconate (MEDLINE KIT)  15 mL Mouth Rinse BID  . feeding supplement (PRO-STAT SUGAR FREE 64)  30 mL Per Tube Daily  . free water  100 mL Per Tube TID  . insulin aspart  0-15 Units Subcutaneous Q4H  . insulin glargine  10 Units Subcutaneous QHS  . mouth rinse  15 mL Mouth Rinse QID  . pantoprazole sodium  40 mg Per Tube Daily  . scopolamine  1 patch Transdermal Q72H   Continuous Infusions: .  feeding supplement (JEVITY 1.5 CAL/FIBER) 1,000 mL (06/14/17 1207)     LOS: 15 days    Time spent: 20 minutes   Dessa Phi, DO [facility] [email or web address] Password [email or web address] 06/14/2017, 12:51 PM

## 2017-06-14 NOTE — Progress Notes (Signed)
Patient ID: Vincent Hansen, male   DOB: 1945-11-21, 72 y.o.   MRN: [id number]  I came by to discuss his upcoming surgery which is scheduled for Wednesday.  He is agreeable to surgery.  The best I can tell he understands the nature of the surgery.  He has written down a few questions for me which I have tried to answer.  We will need to transfer him to [facility] sometime tomorrow in preparation for surgery on Wednesday.

## 2017-06-14 NOTE — Progress Notes (Signed)
Nutrition Follow-up  DOCUMENTATION CODES:   Non-severe (moderate) malnutrition in context of chronic illness  INTERVENTION:  - Increase TF to Jevity 1.5 @ 50 mL/hr at 1 PM today and then advance to 60 mL/hr tomorrow at 9 AM.  - Continue 30 mL Prostat once/day and will decrease free water to 100 mL TID.   At goal rate of Jevity 1.5 @ 60 mL/hr with 30 mL Prostat once/day and 100 mL free water TID, this regimen will provide 2260 kcal, 107 grams of protein, and 1394 mL free water.     NUTRITION DIAGNOSIS:   Moderate Malnutrition related to chronic illness(COPD, Type 2 DM) as evidenced by moderate muscle depletion, moderate fat depletion -ongoing  GOAL:   Patient will meet greater than or equal to 90% of their needs -unmet with TF not yet at goal rate  MONITOR:   TF tolerance, Labs, Weight trends, Skin  ASSESSMENT:   72yo M w/ PMH including HTN, COPD and T2DM. Pt brough in by police who report that the patient called them to the home 3-4 times in the 24 hours PTA.  Each time they noted that he seemed to be responding to internal stimuli, talking to people who were not actually there and he appeared to be having hallucinations. On 7/61 the police contacted the crisis line, who did assessment and IVC'd patient for auditory and visual hallucinations, concern for safety.   Weight fluctuating since admission; will continue to monitor closely. Pt had PEG placed on 3/27 and is currently receiving Jevity 1.5 @ 40 mL/hr with 30 mL Prostat once/day and 100 mL free water every 4 hours without issue. K, Mg, and Phos currently WDL following repletion. Will increase TF as outlined above. Pt remains NPO.   Per Dr. Jeannine Kitten note from yesterday afternoon: plan at this time is for total laryngectomy at [facility] on Wednesday (4/3) with Dr. Constance Holster. Pt with medium-sized hiatal hernia. Will need dental f/u prior to starting chemoradiation therapy. Will need SNF at d/c, after laryngectomy.    Medications reviewed;  sliding scale Novolog, 10 units Lantus/day, 40 mg Protonix per PEG/day. Labs reviewed; CBGs: 216, 194, and 175 mg/dL this AM, Cl: 100 mmol/L, Ca: 8.8 mg/dL.    Diet Order:  Diet NPO time specified  EDUCATION NEEDS:   No education needs have been identified at this time  Skin:  Skin Assessment: Skin Integrity Issues: Skin Integrity Issues:: Incisions Incisions: s/p PEG placement (abdomen)  Last BM:  3/29  Height:   Ht Readings from Last 1 Encounters:  06/10/17 6' (1.829 m)    Weight:   Wt Readings from Last 1 Encounters:  06/14/17 162 lb 14.7 oz (73.9 kg)    Ideal Body Weight:  80.91 kg  BMI:  Body mass index is 22.1 kg/m.  Estimated Nutritional Needs:   Kcal:  2100-2300 kcal (~30-33 kcal/kg)  Protein:  105-115 grams (20% kcal)  Fluid:  >=2.1 L/day       Jarome Matin, MS, RD, LDN, [facility] Inpatient Clinical Dietitian Pager # [phone] After hours/weekend pager # [phone]

## 2017-06-14 NOTE — Progress Notes (Signed)
Physical Therapy Treatment Patient Details Name: Vincent Hansen MRN: [id number] DOB: 1946-01-22 Today's Date: 06/14/2017    History of Present Illness 72 year old with past medical history relevant for hypertension, COPD, type 2 diabetes, prior history of squamous cell carcinoma of the larynx admitted with hypoxia and found to have large laryngeal mass obstructing airway status post emergency tracheostomy placement with 6 Shiley tracheostomy on 05/30/2017 status post extubation on 05/31/2017. Due to continued aspiration risk, he is kept NPO and PEG was placed for nutrition. ENT planning for total laryngectomy     PT Comments    Pt assisted with changing gown (RN also in room).  Pt with increased secretions today and required frequent rest breaks for breathing and secretions.  Pt agreeable to bed exercises and initially agreeable to sit and possibly transfer however declined to perform transfers end of session.     Follow Up Recommendations  SNF;Supervision for mobility/OOB     Equipment Recommendations  Rolling walker with 5" wheels    Recommendations for Other Services       Precautions / Restrictions Precautions Precautions: Fall Precaution Comments: trach    Mobility  Bed Mobility Overal bed mobility: Needs Assistance Bed Mobility: Supine to Sit     Supine to sit: Min guard     General bed mobility comments: pt able to long sit in bed utilizing hand rails to pull himself into upright position, declined further mobility today  Transfers                    Ambulation/Gait                 Stairs            Wheelchair Mobility    Modified Rankin (Stroke Patients Only)       Balance                                            Cognition                                              Exercises General Exercises - Lower Extremity Heel Slides: AROM;10 reps;Both Hip ABduction/ADduction: AROM;10  reps;Both Straight Leg Raises: AROM;5 reps;Both    General Comments        Pertinent Vitals/Pain Pain Assessment: Faces Faces Pain Scale: Hurts a little bit Pain Intervention(s): Repositioned;Monitored during session(pt reports he has received pain meds this morning)    Home Living                      Prior Function            PT Goals (current goals can now be found in the care plan section) Progress towards PT goals: Progressing toward goals    Frequency    Min 2X/week      PT Plan Current plan remains appropriate    Co-evaluation              AM-PAC PT "6 Clicks" Daily Activity  Outcome Measure  Difficulty turning over in bed (including adjusting bedclothes, sheets and blankets)?: A Lot Difficulty moving from lying on back to sitting on the side of the bed? : Unable Difficulty sitting down on and standing up  from a chair with arms (e.g., wheelchair, bedside commode, etc,.)?: Unable Help needed moving to and from a bed to chair (including a wheelchair)?: A Lot Help needed walking in hospital room?: Total Help needed climbing 3-5 steps with a railing? : Total 6 Click Score: 8    End of Session Equipment Utilized During Treatment: Oxygen Activity Tolerance: Patient limited by fatigue Patient left: in bed;with call bell/phone within reach;with bed alarm set   PT Visit Diagnosis: Difficulty in walking, not elsewhere classified (R26.2)     Time: 7654-6503 PT Time Calculation (min) (ACUTE ONLY): 31 min  Charges:  $Therapeutic Exercise: 8-22 mins                    G Codes:     Carmelia Bake, PT, DPT 06/14/2017 Pager: [phone]    York Ram E 06/14/2017, 1:21 PM

## 2017-06-15 LAB — PHOSPHORUS: Phosphorus: 2.8 mg/dL (ref 2.5–4.6)

## 2017-06-15 LAB — BASIC METABOLIC PANEL
ANION GAP: 8 (ref 5–15)
BUN: 10 mg/dL (ref 6–20)
CO2: 35 mmol/L — ABNORMAL HIGH (ref 22–32)
Calcium: 9 mg/dL (ref 8.9–10.3)
Chloride: 101 mmol/L (ref 101–111)
Creatinine, Ser: 0.92 mg/dL (ref 0.61–1.24)
GFR calc Af Amer: >60 mL/min (ref >60)
GLUCOSE: 185 mg/dL — AB (ref 65–99)
POTASSIUM: 3.7 mmol/L (ref 3.5–5.1)
SODIUM: 144 mmol/L (ref 135–145)

## 2017-06-15 LAB — CBC
HCT: 35.3 % — ABNORMAL LOW (ref 39.0–52.0)
Hemoglobin: 11.1 g/dL — ABNORMAL LOW (ref 13.0–17.0)
MCH: 27 pg (ref 26.0–34.0)
MCHC: 31.4 g/dL (ref 30.0–36.0)
MCV: 85.9 fL (ref 78.0–100.0)
PLATELETS: 270 10*3/uL (ref 150–400)
RBC: 4.11 MIL/uL — AB (ref 4.22–5.81)
RDW: 13.5 % (ref 11.5–15.5)
WBC: 5.6 10*3/uL (ref 4.0–10.5)

## 2017-06-15 LAB — GLUCOSE, CAPILLARY
GLUCOSE-CAPILLARY: 199 mg/dL — AB (ref 65–99)
GLUCOSE-CAPILLARY: 165 mg/dL — AB (ref 65–99)
GLUCOSE-CAPILLARY: 213 mg/dL — AB (ref 65–99)
GLUCOSE-CAPILLARY: 134 mg/dL — AB (ref 65–99)
GLUCOSE-CAPILLARY: 151 mg/dL — AB (ref 65–99)
Glucose-Capillary: 166 mg/dL — ABNORMAL HIGH (ref 65–99)

## 2017-06-15 LAB — SURGICAL PCR SCREEN
MRSA, PCR: NEGATIVE
Staphylococcus aureus: NEGATIVE

## 2017-06-15 LAB — MAGNESIUM: Magnesium: 1.5 mg/dL — ABNORMAL LOW (ref 1.7–2.4)

## 2017-06-15 MED ORDER — MAGNESIUM SULFATE 2 GM/50ML IV SOLN
2.0000 g | Freq: Once | INTRAVENOUS | Status: AC
  Administered 2017-06-15: 2 g via INTRAVENOUS
  Filled 2017-06-15: qty 50

## 2017-06-15 MED ORDER — CLINDAMYCIN PHOSPHATE 900 MG/50ML IV SOLN
900.0000 mg | INTRAVENOUS | Status: AC
  Administered 2017-06-16: 900 mg via INTRAVENOUS
  Filled 2017-06-15: qty 50

## 2017-06-15 NOTE — Progress Notes (Signed)
Inner cannula changed at 0000. Pt tolerated well.

## 2017-06-15 NOTE — Care Management Note (Signed)
Case Management Note  Patient Details  Name: Vincent Hansen MRN: [id number] Date of Birth: [birth year]  Subjective/Objective: Transfer to [facility] for surgery-total laryngectomy in am.                    Action/Plan:d/c plan to SNF   Expected Discharge Date:  (unknown)               Expected Discharge Plan:  Acute to Acute Transfer  In-House Referral:  Clinical Social Work  Discharge planning Services  CM Consult  Post Acute Care Choice:    Choice offered to:     DME Arranged:    DME Agency:     HH Arranged:    [place] Agency:     Status of Service:  In process, will continue to follow  If discussed at Long Length of Stay Meetings, dates discussed:    Additional Comments:  Dessa Phi, RN 06/15/2017, 1:08 PM

## 2017-06-15 NOTE — Social Work (Signed)
CSW acknowledging pt arrival to [facility] for procedure tomorrow. Aware pt will likely require SNF level care at discharge.   CSW continuing to follow.  Alexander Mt, [place] Work [phone]

## 2017-06-15 NOTE — Progress Notes (Signed)
PROGRESS NOTE    Vincent Hansen  IZX:[id number] DOB: [birth year] DOA: 05/30/2017 PCP: Patient, No Pcp Per     Brief Narrative:  Vincent Hansen is a 72 year old with past medical history relevant for hypertension, COPD, type 2 diabetes, prior history of squamous cell carcinoma of the larynx admitted with hypoxia and found to have large laryngeal mass obstructing airway status post emergency tracheostomy placement with 6 Shiley tracheostomy on 05/30/2017 status post extubation on 05/31/2017. Due to continued aspiration risk, he is kept NPO and PEG was placed for nutrition. ENT planning for total laryngectomy 4/3.   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 Currently has a #6 XLT  -Dental consulted, will need dental follow up for periodontal therapy prior to start of chemoradiation therapy. Recommend orthopantogram once transferred to [facility]  -Oncology consulted for adjuvant systemic therapy and radiation post-surgery recovery  -PEG placed 3/27, tube feeding initiated. Monitor electrolytes. Tolerating well.  -ENT planning for total laryngectomy at [facility] 4/3 Wednesday   Hiatal hernia -CT of the abdomen showed concern for possible gastric adenocarcinoma versus hiatal hernia -S/p EGD 06/07/2017 and showed no evidence of gastric adenocarcinoma but did show medium sized hiatal hernia   Hypertension -Continue as needed labetalol every 2 hours IV -BP remains stable    Type 2 diabetes -SSI  -Lantus  -Blood sugar improved   COPD -Without exacerbation. Continue brovana, pulmicort    DVT prophylaxis: SCD Code Status: Full Family Communication: No family at bedside Disposition Plan: Planning for laryngectomy 4/3 at [facility]. Transfer to [facility] today. Will need SNF at discharge post-op.     Consultants:   ENT  IR  GI  Oncology  Procedures:   Emergent trach and laryngoscopy 3/17 by Dr. Constance Holster   EGD 3/25   Antimicrobials:  Anti-infectives (From admission, onward)   Start     Dose/Rate Route Frequency Ordered Stop   06/09/17 1500  ceFAZolin (ANCEF) IVPB 2g/100 mL premix     2 g 200 mL/hr over 30 Minutes Intravenous To Radiology 06/08/17 1427 06/09/17 1410       Subjective: Able to communicate by writing. No acute issues. Aware of transfer to [facility] for surgery tomorrow   Objective: Vitals:   06/14/17 2251 06/15/17 0300 06/15/17 0448 06/15/17 0737  BP:   135/74   Pulse: 72 (!) 58 68   Resp: '18 18 18   ' Temp:   97.6 F (36.4 C)   TempSrc:   Oral   SpO2: 95% 96% 100% 92%  Weight:   74.6 kg (164 lb 7.4 oz)   Height:       No intake or output data in the 24 hours ending 06/15/17 1029 Filed Weights   06/13/17 0343 06/14/17 0518 06/15/17 0448  Weight: 72.2 kg (159 lb 2.8 oz) 73.9 kg (162 lb 14.7 oz) 74.6 kg (164 lb 7.4 oz)   Examination: General exam: Appears calm and comfortable  Respiratory system: +Corase bilaterally. +Trach with thick secretions. Respiratory effort  normal. Cardiovascular system: S1 & S2 heard, RRR. No JVD, murmurs, rubs, gallops or clicks. No pedal edema. Gastrointestinal system: Abdomen is nondistended, soft and nontender. No organomegaly or masses felt. Normal bowel sounds heard. Central nervous system: Alert. No focal neurological deficits. Extremities: Symmetric 5 x 5 power. Skin: No rashes, lesions or ulcers Psychiatry: Judgement and insight appear stable   Data Reviewed: I have personally  reviewed following labs and imaging studies  CBC: Recent Labs  Lab 06/09/17 0526  06/11/17 0316 06/12/17 0501 06/13/17 0440 06/14/17 0516 06/15/17 0519  WBC 7.1   < > 7.8 8.1 5.9 5.1 5.6  NEUTROABS 6.0  --   --   --   --   --   --   HGB 12.3*   < > 11.8* 12.2* 11.6* 11.2* 11.1*  HCT 39.2   < > 36.7* 39.1 36.2* 35.9* 35.3*  MCV 85.6   < > 84.0 86.7 85.8 86.1 85.9  PLT 251   < > 229 265 232 244 270   < > = values in this interval not displayed.   Basic Metabolic Panel: Recent Labs  Lab 06/11/17 0316 06/12/17 0501 06/13/17 0440 06/14/17 0516 06/15/17 0519  NA 143 146* 143 143 144  K 3.6 3.9 3.8 3.6 3.7  CL 104 103 102 100* 101  CO2 32 35* 32 35* 35*  GLUCOSE 166* 137* 211* 191* 185*  BUN '13 16 12 11 10  ' CREATININE 1.16 1.23 0.98 1.01 0.92  CALCIUM 8.8* 9.2 9.0 8.8* 9.0  MG 1.9 1.7 1.6* 1.7 1.5*  PHOS 2.6 2.4* 2.2* 2.7 2.8   GFR: Estimated Creatinine Clearance: 77.7 mL/min (by C-G formula based on SCr of 0.92 mg/dL). Liver Function Tests: Recent Labs  Lab 06/09/17 0526  AST 18  ALT 19  ALKPHOS 58  BILITOT 0.8  PROT 6.1*  ALBUMIN 2.8*   No results for input(s): LIPASE, AMYLASE in the last 168 hours. No results for input(s): AMMONIA in the last 168 hours. Coagulation Profile: Recent Labs  Lab 06/09/17 0526  INR 1.14   Cardiac Enzymes: No results for input(s): CKTOTAL, CKMB, CKMBINDEX, TROPONINI in the last 168 hours. BNP (last 3 results) No results for input(s): PROBNP in the last 8760 hours. HbA1C: No results for input(s): HGBA1C in the last 72 hours. CBG: Recent Labs  Lab 06/14/17 1654 06/14/17 2018 06/14/17 2342 06/15/17 0445 06/15/17 0752  GLUCAP 213* 243* 205* 199* 165*   Lipid Profile: No results for input(s): CHOL, HDL, LDLCALC, TRIG, CHOLHDL, LDLDIRECT in the last 72 hours. Thyroid Function Tests: No results for input(s): TSH, T4TOTAL, FREET4, T3FREE, THYROIDAB in the last 72 hours. Anemia Panel: No results for input(s): VITAMINB12,  FOLATE, FERRITIN, TIBC, IRON, RETICCTPCT in the last 72 hours. Sepsis Labs: No results for input(s): PROCALCITON, LATICACIDVEN in the last 168 hours.  Recent Results (from the past 240 hour(s))  Culture, respiratory (NON-Expectorated)     Status: None (Preliminary result)   Collection Time: 06/14/17  3:30 PM  Result Value Ref Range Status   Specimen Description   Final    TRACHEAL ASPIRATE Performed at [facility] [address] [place], [place] [address]    Special Requests   Final    NONE Performed at [facility], [place] [address], [place], [place] [address]    Gram Stain   Final    ABUNDANT WBC PRESENT,BOTH PMN AND MONONUCLEAR MODERATE GRAM VARIABLE ROD FEW GRAM POSITIVE COCCI    Culture   Final  CULTURE REINCUBATED FOR BETTER GROWTH Performed at [facility], [place] [address] [place], [place] [address]    Report Status PENDING  Incomplete       Radiology Studies: No results found.    Scheduled Meds: . arformoterol  15 mcg Nebulization BID  . budesonide (PULMICORT) nebulizer solution  0.5 mg Nebulization BID  . chlorhexidine gluconate (MEDLINE KIT)  15 mL Mouth Rinse BID  . feeding supplement (PRO-STAT SUGAR FREE 64)  30 mL Per Tube Daily  . free water  100 mL Per Tube TID  . insulin aspart  0-15 Units Subcutaneous Q4H  . insulin glargine  10 Units Subcutaneous QHS  . mouth rinse  15 mL Mouth Rinse QID  . pantoprazole sodium  40 mg Per Tube Daily  . scopolamine  1 patch Transdermal Q72H   Continuous Infusions: . feeding supplement (JEVITY 1.5 CAL/FIBER) 1,000 mL (06/14/17 1207)     LOS: 16 days    Time spent: 25 minutes   Dessa Phi, DO [facility] [email or web address] Password [facility] 06/15/2017, 10:29 AM

## 2017-06-15 NOTE — Progress Notes (Signed)
Pt transfer from [facility] for Total Laryngectomy tomorrow, pt on trach -collar, changed and clean the trach, too much secretions, suctions, connected the peg tube to tube feeding jevity 1.5 at 40 ml, pulse oximeter 94%, he is alert and oriented and wrote what he needs, given fentanyl for pain meds.

## 2017-06-16 ENCOUNTER — Inpatient Hospital Stay (HOSPITAL_COMMUNITY): Payer: Non-veteran care | Admitting: Anesthesiology

## 2017-06-16 ENCOUNTER — Encounter (HOSPITAL_COMMUNITY): Payer: Self-pay | Admitting: Anesthesiology

## 2017-06-16 ENCOUNTER — Encounter (HOSPITAL_COMMUNITY)
Admission: EM | Disposition: A | Discharge status: Home Health | Payer: Self-pay | Source: Home / Self Care | Attending: Otolaryngology

## 2017-06-16 DIAGNOSIS — Z9002 Acquired absence of larynx: Secondary | ICD-10-CM

## 2017-06-16 DIAGNOSIS — T819XXA Unspecified complication of procedure, initial encounter: Secondary | ICD-10-CM | POA: Diagnosis present

## 2017-06-16 DIAGNOSIS — E440 Moderate protein-calorie malnutrition: Secondary | ICD-10-CM

## 2017-06-16 DIAGNOSIS — R1313 Dysphagia, pharyngeal phase: Secondary | ICD-10-CM

## 2017-06-16 DIAGNOSIS — E1165 Type 2 diabetes mellitus with hyperglycemia: Secondary | ICD-10-CM

## 2017-06-16 DIAGNOSIS — K0530 Chronic periodontitis, unspecified: Secondary | ICD-10-CM | POA: Diagnosis present

## 2017-06-16 HISTORY — PX: LARYNGETOMY: SHX5202

## 2017-06-16 LAB — BASIC METABOLIC PANEL
ANION GAP: 11 (ref 5–15)
BUN: 11 mg/dL (ref 6–20)
CO2: 34 mmol/L — ABNORMAL HIGH (ref 22–32)
Calcium: 8.9 mg/dL (ref 8.9–10.3)
Chloride: 96 mmol/L — ABNORMAL LOW (ref 101–111)
Creatinine, Ser: 1.16 mg/dL (ref 0.61–1.24)
GLUCOSE: 248 mg/dL — AB (ref 65–99)
Potassium: 3.9 mmol/L (ref 3.5–5.1)
Sodium: 141 mmol/L (ref 135–145)

## 2017-06-16 LAB — PHOSPHORUS: PHOSPHORUS: 3.2 mg/dL (ref 2.5–4.6)

## 2017-06-16 LAB — CBC
HEMATOCRIT: 35.2 % — AB (ref 39.0–52.0)
HEMOGLOBIN: 10.9 g/dL — AB (ref 13.0–17.0)
MCH: 26.9 pg (ref 26.0–34.0)
MCHC: 31 g/dL (ref 30.0–36.0)
MCV: 86.9 fL (ref 78.0–100.0)
Platelets: 239 10*3/uL (ref 150–400)
RBC: 4.05 MIL/uL — AB (ref 4.22–5.81)
RDW: 14 % (ref 11.5–15.5)
WBC: 4.4 10*3/uL (ref 4.0–10.5)

## 2017-06-16 LAB — GLUCOSE, CAPILLARY
GLUCOSE-CAPILLARY: 241 mg/dL — AB (ref 65–99)
GLUCOSE-CAPILLARY: 161 mg/dL — AB (ref 65–99)
Glucose-Capillary: 150 mg/dL — ABNORMAL HIGH (ref 65–99)
Glucose-Capillary: 170 mg/dL — ABNORMAL HIGH (ref 65–99)
Glucose-Capillary: 185 mg/dL — ABNORMAL HIGH (ref 65–99)
Glucose-Capillary: 189 mg/dL — ABNORMAL HIGH (ref 65–99)

## 2017-06-16 LAB — MAGNESIUM: MAGNESIUM: 1.9 mg/dL (ref 1.7–2.4)

## 2017-06-16 SURGERY — LARYNGECTOMY
Anesthesia: General

## 2017-06-16 MED ORDER — STERILE WATER FOR IRRIGATION IR SOLN
Status: DC | PRN
  Administered 2017-06-16: 1000 mL

## 2017-06-16 MED ORDER — ONDANSETRON HCL 4 MG/2ML IJ SOLN
INTRAMUSCULAR | Status: AC
  Filled 2017-06-16: qty 2

## 2017-06-16 MED ORDER — ROCURONIUM BROMIDE 10 MG/ML (PF) SYRINGE
PREFILLED_SYRINGE | INTRAVENOUS | Status: AC
  Filled 2017-06-16: qty 5

## 2017-06-16 MED ORDER — HYDROMORPHONE HCL 1 MG/ML IJ SOLN
INTRAMUSCULAR | Status: AC
  Filled 2017-06-16: qty 0.5

## 2017-06-16 MED ORDER — ROCURONIUM BROMIDE 100 MG/10ML IV SOLN
INTRAVENOUS | Status: DC | PRN
  Administered 2017-06-16 (×2): 10 mg via INTRAVENOUS
  Administered 2017-06-16: 50 mg via INTRAVENOUS

## 2017-06-16 MED ORDER — BACITRACIN ZINC 500 UNIT/GM EX OINT
TOPICAL_OINTMENT | CUTANEOUS | Status: DC | PRN
  Administered 2017-06-16: 1 via TOPICAL

## 2017-06-16 MED ORDER — NEOSTIGMINE METHYLSULFATE 5 MG/5ML IV SOSY
PREFILLED_SYRINGE | INTRAVENOUS | Status: AC
  Filled 2017-06-16: qty 10

## 2017-06-16 MED ORDER — IBUPROFEN 100 MG/5ML PO SUSP: 400.0000 mg | Freq: Four times a day (QID) | ORAL | Status: DC | PRN

## 2017-06-16 MED ORDER — ONDANSETRON HCL 4 MG/2ML IJ SOLN: 4.0000 mg | Freq: Once | INTRAMUSCULAR | Status: DC | PRN

## 2017-06-16 MED ORDER — MIDAZOLAM HCL 2 MG/2ML IJ SOLN
INTRAMUSCULAR | Status: AC
  Filled 2017-06-16: qty 2

## 2017-06-16 MED ORDER — PROMETHAZINE HCL 25 MG PO TABS: 25.0000 mg | ORAL_TABLET | Freq: Four times a day (QID) | ORAL | Status: DC | PRN

## 2017-06-16 MED ORDER — DOCUSATE SODIUM 50 MG/5ML PO LIQD
100.0000 mg | Freq: Two times a day (BID) | ORAL | Status: DC
  Administered 2017-06-16 – 2017-07-06 (×32): 100 mg
  Filled 2017-06-16 (×38): qty 10

## 2017-06-16 MED ORDER — MORPHINE SULFATE (CONCENTRATE) 10 MG/0.5ML PO SOLN
10.0000 mg | Freq: Three times a day (TID) | ORAL | Status: DC
  Administered 2017-06-16 – 2017-06-23 (×19): 10 mg
  Filled 2017-06-16 (×19): qty 0.5

## 2017-06-16 MED ORDER — BISACODYL 10 MG RE SUPP
10.0000 mg | Freq: Every day | RECTAL | Status: DC | PRN
  Filled 2017-06-16: qty 1

## 2017-06-16 MED ORDER — FENTANYL CITRATE (PF) 100 MCG/2ML IJ SOLN
INTRAMUSCULAR | Status: DC | PRN
  Administered 2017-06-16: 100 ug via INTRAVENOUS
  Administered 2017-06-16 (×4): 50 ug via INTRAVENOUS

## 2017-06-16 MED ORDER — 0.9 % SODIUM CHLORIDE (POUR BTL) OPTIME
TOPICAL | Status: DC | PRN
  Administered 2017-06-16: 1000 mL

## 2017-06-16 MED ORDER — FENTANYL CITRATE (PF) 250 MCG/5ML IJ SOLN
INTRAMUSCULAR | Status: AC
  Filled 2017-06-16: qty 5

## 2017-06-16 MED ORDER — SENNOSIDES 8.8 MG/5ML PO SYRP
5.0000 mL | ORAL_SOLUTION | Freq: Every day | ORAL | Status: DC
  Administered 2017-06-16 – 2017-06-29 (×10): 5 mL
  Filled 2017-06-16 (×13): qty 5

## 2017-06-16 MED ORDER — MIDAZOLAM HCL 5 MG/5ML IJ SOLN
INTRAMUSCULAR | Status: DC | PRN
  Administered 2017-06-16: 1 mg via INTRAVENOUS
  Administered 2017-06-16: 2 mg via INTRAVENOUS

## 2017-06-16 MED ORDER — KETOROLAC TROMETHAMINE 15 MG/ML IJ SOLN
15.0000 mg | Freq: Four times a day (QID) | INTRAMUSCULAR | Status: AC
  Administered 2017-06-16 – 2017-06-18 (×8): 15 mg via INTRAVENOUS
  Filled 2017-06-16 (×8): qty 1

## 2017-06-16 MED ORDER — MORPHINE SULFATE (PF) 4 MG/ML IV SOLN
2.0000 mg | INTRAVENOUS | Status: DC | PRN
  Administered 2017-06-17 – 2017-06-19 (×2): 2 mg via INTRAVENOUS
  Filled 2017-06-16 (×3): qty 1

## 2017-06-16 MED ORDER — LACTATED RINGERS IV SOLN
INTRAVENOUS | Status: DC
  Administered 2017-06-16 – 2017-06-18 (×8): via INTRAVENOUS
  Administered 2017-06-19: 1000 mL via INTRAVENOUS
  Administered 2017-06-21: 12:00:00 via INTRAVENOUS

## 2017-06-16 MED ORDER — ONDANSETRON HCL 4 MG/2ML IJ SOLN
4.0000 mg | Freq: Four times a day (QID) | INTRAMUSCULAR | Status: DC | PRN
  Administered 2017-06-18: 4 mg via INTRAVENOUS
  Filled 2017-06-16 (×2): qty 2

## 2017-06-16 MED ORDER — HYDROCODONE-ACETAMINOPHEN 7.5-325 MG/15ML PO SOLN
10.0000 mL | ORAL | Status: DC | PRN
  Administered 2017-06-18 – 2017-06-23 (×5): 15 mL
  Administered 2017-06-23: 10 mL
  Administered 2017-06-24 – 2017-06-29 (×9): 15 mL
  Filled 2017-06-16 (×16): qty 15

## 2017-06-16 MED ORDER — ACETAMINOPHEN 10 MG/ML IV SOLN
1000.0000 mg | Freq: Once | INTRAVENOUS | Status: AC
  Administered 2017-06-16: 1000 mg via INTRAVENOUS
  Filled 2017-06-16: qty 100

## 2017-06-16 MED ORDER — ONDANSETRON HCL 4 MG/2ML IJ SOLN
INTRAMUSCULAR | Status: DC | PRN
  Administered 2017-06-16: 4 mg via INTRAVENOUS

## 2017-06-16 MED ORDER — DEXAMETHASONE SODIUM PHOSPHATE 10 MG/ML IJ SOLN
INTRAMUSCULAR | Status: AC
  Filled 2017-06-16: qty 2

## 2017-06-16 MED ORDER — PROPOFOL 10 MG/ML IV BOLUS
INTRAVENOUS | Status: AC
  Filled 2017-06-16: qty 40

## 2017-06-16 MED ORDER — CLINDAMYCIN PHOSPHATE 600 MG/50ML IV SOLN
600.0000 mg | Freq: Three times a day (TID) | INTRAVENOUS | Status: AC
  Administered 2017-06-16 – 2017-06-18 (×6): 600 mg via INTRAVENOUS
  Filled 2017-06-16 (×7): qty 50

## 2017-06-16 MED ORDER — IPRATROPIUM-ALBUTEROL 0.5-2.5 (3) MG/3ML IN SOLN
3.0000 mL | Freq: Four times a day (QID) | RESPIRATORY_TRACT | Status: DC
  Administered 2017-06-16 – 2017-06-20 (×17): 3 mL via RESPIRATORY_TRACT
  Filled 2017-06-16 (×18): qty 3

## 2017-06-16 MED ORDER — DEXTROSE-NACL 5-0.9 % IV SOLN
INTRAVENOUS | Status: DC
  Administered 2017-06-16: 17:00:00 via INTRAVENOUS

## 2017-06-16 MED ORDER — PROPOFOL 10 MG/ML IV BOLUS
INTRAVENOUS | Status: DC | PRN
  Administered 2017-06-16: 30 mg via INTRAVENOUS
  Administered 2017-06-16: 110 mg via INTRAVENOUS
  Administered 2017-06-16: 50 mg via INTRAVENOUS
  Administered 2017-06-16 (×4): 30 mg via INTRAVENOUS

## 2017-06-16 MED ORDER — PROMETHAZINE HCL 25 MG RE SUPP
25.0000 mg | Freq: Four times a day (QID) | RECTAL | Status: DC | PRN
  Filled 2017-06-16: qty 1

## 2017-06-16 MED ORDER — BACITRACIN ZINC 500 UNIT/GM EX OINT
TOPICAL_OINTMENT | CUTANEOUS | Status: AC
  Filled 2017-06-16: qty 28.35

## 2017-06-16 MED ORDER — PHENYLEPHRINE 40 MCG/ML (10ML) SYRINGE FOR IV PUSH (FOR BLOOD PRESSURE SUPPORT)
PREFILLED_SYRINGE | INTRAVENOUS | Status: AC
  Filled 2017-06-16: qty 10

## 2017-06-16 MED ORDER — HYDROMORPHONE HCL 1 MG/ML IJ SOLN: 0.2500 mg | INTRAMUSCULAR | Status: DC | PRN

## 2017-06-16 MED ORDER — MEPERIDINE HCL 25 MG/ML IJ SOLN: 6.2500 mg | INTRAMUSCULAR | Status: DC | PRN

## 2017-06-16 MED ORDER — LABETALOL HCL 5 MG/ML IV SOLN
5.0000 mg | INTRAVENOUS | Status: DC | PRN
  Administered 2017-06-21: 10 mg via INTRAVENOUS
  Filled 2017-06-16 (×2): qty 4

## 2017-06-16 MED ORDER — BACITRACIN ZINC 500 UNIT/GM EX OINT
1.0000 "application " | TOPICAL_OINTMENT | Freq: Three times a day (TID) | CUTANEOUS | Status: DC
  Administered 2017-06-16 – 2017-06-23 (×20): 1 via TOPICAL
  Filled 2017-06-16: qty 28.35

## 2017-06-16 MED ORDER — LIDOCAINE HCL (CARDIAC) 20 MG/ML IV SOLN
INTRAVENOUS | Status: DC | PRN
  Administered 2017-06-16: 100 mg via INTRAVENOUS

## 2017-06-16 MED ORDER — SUGAMMADEX SODIUM 200 MG/2ML IV SOLN
INTRAVENOUS | Status: DC | PRN
  Administered 2017-06-16: 200 mg via INTRAVENOUS

## 2017-06-16 MED ORDER — LIDOCAINE HCL (CARDIAC) 20 MG/ML IV SOLN
INTRAVENOUS | Status: AC
  Filled 2017-06-16: qty 5

## 2017-06-16 MED ORDER — HYDROMORPHONE HCL 1 MG/ML IJ SOLN
INTRAMUSCULAR | Status: DC | PRN
  Administered 2017-06-16 (×2): 0.5 mg via INTRAVENOUS

## 2017-06-16 MED ORDER — PROPOFOL 10 MG/ML IV BOLUS
INTRAVENOUS | Status: AC
  Filled 2017-06-16: qty 20

## 2017-06-16 MED ORDER — PHENYLEPHRINE HCL 10 MG/ML IJ SOLN
INTRAMUSCULAR | Status: DC | PRN
  Administered 2017-06-16 (×2): 80 ug via INTRAVENOUS

## 2017-06-16 MED ORDER — SODIUM CHLORIDE 0.9 % IJ SOLN
INTRAMUSCULAR | Status: AC
  Filled 2017-06-16: qty 10

## 2017-06-16 SURGICAL SUPPLY — 38 items
CLEANER TIP ELECTROSURG 2X2 (MISCELLANEOUS) ×3 IMPLANT
COVER SURGICAL LIGHT HANDLE (MISCELLANEOUS) ×3 IMPLANT
DRAIN SNY 10 ROU (WOUND CARE) ×4 IMPLANT
DRAIN TLS ROUND 10FR (DRAIN) ×2 IMPLANT
ELECT COATED BLADE 2.86 ST (ELECTRODE) ×3 IMPLANT
ELECT REM PT RETURN 9FT ADLT (ELECTROSURGICAL) ×3
ELECTRODE REM PT RTRN 9FT ADLT (ELECTROSURGICAL) ×1 IMPLANT
EVACUATOR SILICONE 100CC (DRAIN) ×4 IMPLANT
FORCEPS BIPOLAR SPETZLER 8 1.0 (NEUROSURGERY SUPPLIES) ×3 IMPLANT
GAUZE SPONGE 4X4 16PLY XRAY LF (GAUZE/BANDAGES/DRESSINGS) ×5 IMPLANT
GLOVE BIO SURGEON STRL SZ 6.5 (GLOVE) ×2 IMPLANT
GLOVE BIO SURGEONS STRL SZ 6.5 (GLOVE) ×1
GLOVE BIOGEL PI IND STRL 6.5 (GLOVE) IMPLANT
GLOVE BIOGEL PI IND STRL 7.0 (GLOVE) IMPLANT
GLOVE BIOGEL PI INDICATOR 6.5 (GLOVE) ×2
GLOVE BIOGEL PI INDICATOR 7.0 (GLOVE) ×2
GLOVE ECLIPSE 7.5 STRL STRAW (GLOVE) ×5 IMPLANT
GLOVE SURG SS PI 6.0 STRL IVOR (GLOVE) ×2 IMPLANT
GLOVE SURG SS PI 7.0 STRL IVOR (GLOVE) ×2 IMPLANT
GOWN STRL REUS W/ TWL LRG LVL3 (GOWN DISPOSABLE) ×2 IMPLANT
GOWN STRL REUS W/TWL LRG LVL3 (GOWN DISPOSABLE) ×8
KIT BASIN OR (CUSTOM PROCEDURE TRAY) ×3 IMPLANT
KIT TURNOVER KIT B (KITS) ×3 IMPLANT
NS IRRIG 1000ML POUR BTL (IV SOLUTION) ×2 IMPLANT
PAD ARMBOARD 7.5X6 YLW CONV (MISCELLANEOUS) ×6 IMPLANT
PENCIL FOOT CONTROL (ELECTRODE) ×3 IMPLANT
SPONGE LAP 18X18 X RAY DECT (DISPOSABLE) ×3 IMPLANT
STAPLER VISISTAT 35W (STAPLE) ×3 IMPLANT
SUT CHROMIC 3 0 SH 27 (SUTURE) ×9 IMPLANT
SUT ETHILON 3 0 PS 1 (SUTURE) ×7 IMPLANT
SUT SILK 2 0 SH (SUTURE) ×2 IMPLANT
SUT SILK 2 0 SH CR/8 (SUTURE) ×5 IMPLANT
SUT SILK 4 0 REEL (SUTURE) ×3 IMPLANT
SUT VIC AB 3-0 FS2 27 (SUTURE) ×10 IMPLANT
TRAY ENT MC OR (CUSTOM PROCEDURE TRAY) ×3 IMPLANT
TRAY FOLEY W/METER SILVER 14FR (SET/KITS/TRAYS/PACK) ×2 IMPLANT
TUBE TRACH MID RANGE 6MM (TUBING) ×2 IMPLANT
WATER STERILE IRR 1000ML POUR (IV SOLUTION) ×2 IMPLANT

## 2017-06-16 NOTE — Progress Notes (Signed)
Progress Note    Vincent Hansen  UKG:[id number] DOB: [birth year]  DOA: 05/30/2017 PCP: Patient, No Pcp Per    Brief Narrative:   Chief complaint: Follow-up cancer of the larynx  Medical records reviewed and are as summarized below:  Vincent Hansen is an 72 y.o. male with a PMH of hypertension, COPD, type 2 diabetes, squamous cell carcinoma of the larynx was admitted 05/30/17 after he was found to have a large laryngeal mass obstructing his airway requiring an emergent tracheostomy on 05/30/17. He was extubated 05/31/17. Because of continued aspiration risk, a PEG tube was placed for nutrition and he is scheduled to undergo total laryngectomy 06/16/17.  Assessment/Plan:   Principal Problem:   Acute respiratory failure with hypoxia secondary to Squamous cell carcinoma of larynx (HCC) status post tracheostomy with severe oropharyngeal dysphasia Patient currently has a #6 XLT trach status post tracheostomy on 05/30/17. PEG placed with initiation of tube feeding 06/09/17. Total laryngectomy planned for 06/16/17, but unfortunately his tube feedings were never turned off after midnight so this may be deferred for today. Oncology to prescribe adjuvant systemic chemotherapy and radiation. Speech therapy is following to determine readiness for Passy-Muir valve to help with speech, but continues to have copious secretions which won't allow him to safely use the valve. This is causing him a lot of frustration. Spent 20 minutes at the bedside explaining this to him and answering his other questions.  Active Problems:   Chronic periodontitis with bone loss Evaluated by dental medicine 06/09/17. He deferred dental treatment pending results of laryngectomy.    Malnutrition of moderate degree Dietitian following for tube feeding recommendations. Body mass index is 22.31 kg/m.    DM2 (diabetes mellitus, type 2) ([place]) Currently being managed with 10 units of Lantus and moderate scale SSI every 4 hours. CBGs  151-241. Given that the patient is nothing by mouth for surgery scheduled for today, will not adjust insulin. Adjust insulin when tube feedings resumed if CBGs greater than 180.    HTN (hypertension) Continue labetalol as needed. Blood pressure currently controlled.    Abnormal CT scan, stomach CT scans reviewed from 05/30/17 gastric cardia appears thickened with lobulations and given his distention of the esophagus, there is concern for spread of the tumor to this region.    COPD Continue Brovana and Pulmicort. Aggressive pulmonary toilet post operatively.    Hiatal hernia Continue Protonix.   Family Communication/Anticipated D/C date and plan/Code Status   DVT prophylaxis: SCDs ordered. Code Status: Full Code.  Family Communication: No family currently at the bedside. Disposition Plan: Unclear. Suspect will need a SNF postoperatively.   Medical Consultants:    Otolaryngology  Pulmonology  Dental medicine  Interventional Radiology  Oncology  Gastroenterology   Anti-Infectives:    None  Subjective:   Patient is very irritable and frustrated with his prolonged hospital stay and inability to speak. He does not feel like he is getting pain medications to effectively manage his pain. Has throat pain and copious secretions but denies shortness of breath.  Objective:    Vitals:   06/15/17 2111 06/15/17 2340 06/16/17 0336 06/16/17 0420  BP: 122/78   107/75  Pulse: (!) 57 65 68 (!) 55  Resp: '18 18 18 18  ' Temp: 98.7 F (37.1 C)   98.5 F (36.9 C)  TempSrc: Oral   Oral  SpO2: 99% 98% 98% 100%  Weight:      Height:        Intake/Output Summary (Last 24  hours) at 06/16/2017 0817 Last data filed at 06/16/2017 0421 Gross per 24 hour  Intake 530 ml  Output 250 ml  Net 280 ml   Filed Weights   06/13/17 0343 06/14/17 0518 06/15/17 0448  Weight: 72.2 kg (159 lb 2.8 oz) 73.9 kg (162 lb 14.7 oz) 74.6 kg (164 lb 7.4 oz)    Exam: General: Frail elderly male. Head  and neck: Tracheostomy tube intact with copious clear secretions. Oropharynx with extensive tooth decay and periodontitis. Multiple missing teeth. Cardiovascular: Heart sounds show a regular rate, and rhythm. No gallops or rubs. No murmurs. No JVD. Lungs: Upper airway rhonchi. Abdomen: Soft, nontender, nondistended with normal active bowel sounds. No masses. No hepatosplenomegaly. PEG tube present. Neurological: Alert and oriented 3. Moves all extremities 4 with equal strength. Cranial nerves II through XII grossly intact. Skin: Warm and dry. No rashes or lesions. Extremities: No clubbing or cyanosis. No edema. Pedal pulses 2+. Psychiatric: Mood and affect are angry/irritable. Insight and judgment are impaired.   Data Reviewed:   I have personally reviewed following labs and imaging studies:  Labs: Labs show the following:   Basic Metabolic Panel: Recent Labs  Lab 06/12/17 0501 06/13/17 0440 06/14/17 0516 06/15/17 0519 06/16/17 0420  NA 146* 143 143 144 141  K 3.9 3.8 3.6 3.7 3.9  CL 103 102 100* 101 96*  CO2 35* 32 35* 35* 34*  GLUCOSE 137* 211* 191* 185* 248*  BUN '16 12 11 10 11  ' CREATININE 1.23 0.98 1.01 0.92 1.16  CALCIUM 9.2 9.0 8.8* 9.0 8.9  MG 1.7 1.6* 1.7 1.5* 1.9  PHOS 2.4* 2.2* 2.7 2.8 3.2   GFR Estimated Creatinine Clearance: 61.6 mL/min (by C-G formula based on SCr of 1.16 mg/dL).  CBC: Recent Labs  Lab 06/12/17 0501 06/13/17 0440 06/14/17 0516 06/15/17 0519 06/16/17 0420  WBC 8.1 5.9 5.1 5.6 4.4  HGB 12.2* 11.6* 11.2* 11.1* 10.9*  HCT 39.1 36.2* 35.9* 35.3* 35.2*  MCV 86.7 85.8 86.1 85.9 86.9  PLT 265 232 244 270 239   CBG: Recent Labs  Lab 06/15/17 2028 06/15/17 2104 06/16/17 0131 06/16/17 0418 06/16/17 0758  GLUCAP 151* 166* 185* 241* 170*   Sepsis Labs: Recent Labs  Lab 06/13/17 0440 06/14/17 0516 06/15/17 0519 06/16/17 0420  WBC 5.9 5.1 5.6 4.4    Microbiology Recent Results (from the past 240 hour(s))  Culture, respiratory  (NON-Expectorated)     Status: None (Preliminary result)   Collection Time: 06/14/17  3:30 PM  Result Value Ref Range Status   Specimen Description   Final    TRACHEAL ASPIRATE Performed at [facility], [place] [address], [place], [place] [address]    Special Requests   Final    NONE Performed at [facility], [place] [address] [place], [place] [address]    Gram Stain   Final    ABUNDANT WBC PRESENT,BOTH PMN AND MONONUCLEAR MODERATE GRAM VARIABLE ROD FEW GRAM POSITIVE COCCI    Culture   Final    CULTURE REINCUBATED FOR BETTER GROWTH Performed at [facility], [place] [address] [place], [place] [address]    Report Status PENDING  Incomplete  Surgical pcr screen     Status: None   Collection Time: 06/15/17  5:15 PM  Result Value Ref Range Status   MRSA, PCR NEGATIVE NEGATIVE Final   Staphylococcus aureus NEGATIVE NEGATIVE Final    Comment: (NOTE) The Xpert SA Assay (FDA approved for NASAL specimens in patients 22  years of age and older), is one component of a comprehensive surveillance program. It is not intended to diagnose infection nor to guide or monitor treatment. Performed at [facility], [place] [address], [place], [place] [address]     Procedures and diagnostic studies:  No results found.  Medications:   . arformoterol  15 mcg Nebulization BID  . budesonide (PULMICORT) nebulizer solution  0.5 mg Nebulization BID  . chlorhexidine gluconate (MEDLINE KIT)  15 mL Mouth Rinse BID  . feeding supplement (PRO-STAT SUGAR FREE 64)  30 mL Per Tube Daily  . free water  100 mL Per Tube TID  . insulin aspart  0-15 Units Subcutaneous Q4H  . insulin glargine  10 Units Subcutaneous QHS  . mouth rinse  15 mL Mouth Rinse QID  . pantoprazole sodium  40 mg Per Tube Daily  . scopolamine  1 patch Transdermal Q72H   Continuous Infusions: . clindamycin (CLEOCIN) IV    . feeding supplement (JEVITY 1.5 CAL/FIBER) 1,000 mL (06/15/17 1701)    40 minutes spent on this patient care including 20 minutes of face-to-face time spent explaining the plan of care and rationale for not initiating Passy-Muir valve.  LOS: 17 days   [facility] Pager [phone]. If unable to reach me by pager, please call my cell phone at [phone].  *Please refer to [email or web address], password [email or web address] to get updated schedule on who will round on this patient, as hospitalists switch teams weekly. If 7PM-7AM, please contact night-coverage at [email or web address], password [email or web address] for any overnight needs.  06/16/2017, 8:17 AM

## 2017-06-16 NOTE — Progress Notes (Addendum)
ENT Post Operative Note  Subjective: No nausea, no vomiting No difficulty voiding Pain partially controlled Patient pulled out the trach tube that was in his laryngeal stoma  Vitals:   06/16/17 1715 06/16/17 1800  BP:  (!) 145/79  Pulse: 92 81  Resp: 18 10  Temp:    SpO2: 93% 97%     OBJECTIVE  Gen: alert, somewhat cooperative  Head/ENT: EOMI, neck supple, mucus membranes moist and pink, conjunctiva clear Incisions c/d/i, JP with moderate sanguinous drainage, neck soft Face moves symmetrically Respiratory: non-labored. On humidified air, stoma patent   ASSESS/ PLAN Vincent Hansen is a 72yo M who is POD 0 from total laryngectomy.  -pain control -MIVF- will saline lock when tolerating PO intake -Trach tube is in stoma to keep laryngeal stoma patent. Patient has a laryngectomy, not a tracheostomy.  -G tube for feeds.  -Keep airway humidified, suction PRN.  -CCM consulted for medical management, appreciate their input.    Dr. Henderson Cloud [facility] [place] [facility] Provider Office [phone]

## 2017-06-16 NOTE — Progress Notes (Signed)
1015 Patient's tube feeding was stopped at 1015 this morning. Paged Dr. Constance Holster to notify him that tube feeding was just stopped. Waiting for response.   Patient taken down to OR at 1135. Report given to Debbie,RN  in short stay. RN made aware tube feeding was stopped at 1015.   Patient took personal witting papers with him to OR as he refused to leave them at the bedside.

## 2017-06-16 NOTE — Interval H&P Note (Signed)
History and Physical Interval Note:  06/16/2017 11:42 AM  Vincent Hansen  has presented today for surgery, with the diagnosis of [place]  The various methods of treatment have been discussed with the patient and family. After consideration of risks, benefits and other options for treatment, the patient has consented to  Procedure(s): TOTAL LARYNGECTOMY (N/A) as a surgical intervention .  The patient's history has been reviewed, patient examined, no change in status, stable for surgery.  I have reviewed the patient's chart and labs.  Questions were answered to the patient's satisfaction.     [name]

## 2017-06-16 NOTE — Anesthesia Preprocedure Evaluation (Signed)
Anesthesia Evaluation  Patient identified by MRN, date of birth, ID band Patient awake    Reviewed: Allergy & Precautions, NPO status , Patient's Chart, lab work & pertinent test results  Airway Mallampati: Trach       Dental   Pulmonary former smoker,    Pulmonary exam normal        Cardiovascular hypertension, Pt. on medications Normal cardiovascular exam     Neuro/Psych    GI/Hepatic   Endo/Other  diabetes, Type 2, Oral Hypoglycemic Agents  Renal/GU      Musculoskeletal   Abdominal   Peds  Hematology   Anesthesia Other Findings   Reproductive/Obstetrics                             Anesthesia Physical Anesthesia Plan  ASA: III  Anesthesia Plan: General   Post-op Pain Management:    Induction: Intravenous  PONV Risk Score and Plan: 2 and Treatment may vary due to age or medical condition and Ondansetron  Airway Management Planned: Tracheostomy  Additional Equipment:   Intra-op Plan:   Post-operative Plan: Possible Post-op intubation/ventilation  Informed Consent: I have reviewed the patients History and Physical, chart, labs and discussed the procedure including the risks, benefits and alternatives for the proposed anesthesia with the patient or authorized representative who has indicated his/her understanding and acceptance.     Plan Discussed with: CRNA and Surgeon  Anesthesia Plan Comments:         Anesthesia Quick Evaluation

## 2017-06-16 NOTE — Op Note (Addendum)
OPERATIVE REPORT  DATE OF SURGERY: 06/16/2017  PATIENT:  Vincent Hansen,  72 y.o. male  PRE-OPERATIVE DIAGNOSIS:  SUPRAGLOTTIC LARYNGEAL CANCER  POST-OPERATIVE DIAGNOSIS:  SUPRAGLOTTIC LARYNGEAL CANCER  PROCEDURE:  Procedure(s): TOTAL LARYNGECTOMY  SURGEON:  Beckie Salts, MD  ASSISTANTS: Jolene Provost PA  ANESTHESIA:   General   EBL: 75 ml  DRAINS: #10 [place] round JP x2  LOCAL MEDICATIONS USED:  None  SPECIMEN: Total laryngectomy  COUNTS:  Correct  PROCEDURE DETAILS: The patient was taken to the operating room and placed on the operating table in the supine position. Following induction of general endotracheal anesthesia, using the tracheostomy, I orally intubated in order to remove the tracheostomy and have it out of the operative field.  The neck was prepped and draped in a standard fashion.  An incision was outlined about 2 cm above the tracheostomy site in a horizontal fashion with bilateral extensions of the anterior sternocleidomastoid muscle.  Electrocautery was used to incise the skin and subcutaneous tissue and through the platysma.  Subplatysmal flap was elevated superiorly past the hyoid and inferiorly down to the clavicle.  A separate stoma circular incision was created and separated from the skin flap.  The larynx was skeletonized superiorly.  The suprahyoid musculature was separated from the thyroid.  The greater horn on both sides was elongated and contiguous with calcified stylohyoid ligaments.  These were simply cut using rongeurs.  The constrictor muscles were dissected off the thyroid ala bilaterally.  The greater horn of the thyroid was then separated from its ligamentous attachment as well and pharyngeal mucosa was all preserved.  The sternocleidomastoid muscle was reflected laterally on both sides exposing the thyroid.  The lateral aspects of the thyroid lobes were preserved and reflected laterally.  The trachea and the tracheostomy site were exposed.  The  pharynx was entered at the epiglottis.  Under direct visualization the mucosal cuts were created allowing 1-2 cm mucosal margins at least.  The tumor is identified involving the entire right supraglottic larynx with extension to the midline and slightly onto the left side at the posterior commissure.  The post cricoid mucosa was incised and was dissected down to the party wall which was then dissected all the way down to the tracheostomy site.  The trachea was transected in a beveled fashion.  A reinforced tube had been replaced into the tracheal stoma prior to this last part of the procedure.  The specimen was sent for pathologic evaluation.  Prior to sending, it was split posteriorly to inspect the margins.  The stoma was matured using interrupted 2-0 silk mattress sutures.  The pharynx was closed in a T fashion using 3-0 Vicryl sutures x3.  Watertight closure was obtained, this was tested by forcing sterile saline down the pharynx.  There is no leak.  Secondary closure was accomplished using the constrictor muscles.  The platysmal layer was reapproximated with interrupted 3-0 chromic and skin staples were used on the skin.  Bacitracin was applied.  The 2 drains were exited through separate stab incisions cut to size and secured in place with silk suture.  The trachea was relatively small and recessed behind the clavicular heads.  I placed an adjustable length 6 tracheostomy tube and secured this in place with Velcro straps.  Patient was then awakened from anesthesia and transferred to ICU in satisfactory condition.    PATIENT DISPOSITION:  To PACU, stable

## 2017-06-16 NOTE — Progress Notes (Signed)
RT note- Patient off the floor at this time for procedure. Reserve trach ordered for the bedside, RN notified, continue to monitor.

## 2017-06-16 NOTE — Anesthesia Procedure Notes (Addendum)
Procedure Name: Intubation Date/Time: 06/16/2017 1:02 PM Performed by: Scheryl Darter, CRNA Pre-anesthesia Checklist: Patient identified, Emergency Drugs available, Suction available and Patient being monitored Patient Re-evaluated:Patient Re-evaluated prior to induction Oxygen Delivery Method: Circle System Utilized Preoxygenation: Pre-oxygenation with 100% oxygen Induction Type: IV induction Ventilation: Mask ventilation without difficulty Tube type: Oral Tube size: 7.0 mm Number of attempts: 1 Airway Equipment and Method: Stylet and Oral airway Placement Confirmation: ETT inserted through vocal cords under direct vision,  positive ETCO2 and breath sounds checked- equal and bilateral Secured at: 22 cm Tube secured with: Tape Dental Injury: Teeth and Oropharynx as per pre-operative assessment  Comments: Pre-O2  Thru trach/anesthesia circuit/after relaxation achieved and pt well ventilated Dr Constance Holster DL'd pt and placed OTT #7.0 under his direct vision/cuff up/easy ventilation

## 2017-06-16 NOTE — Anesthesia Procedure Notes (Signed)
Procedure Name: Intubation Date/Time: 06/16/2017 2:43 PM Performed by: Gwyndolyn Saxon, CRNA Tube type: Reinforced Tube size: 7.0 mm Number of attempts: 1 Placement Confirmation: positive ETCO2,  CO2 detector and breath sounds checked- equal and bilateral Tube secured with: sutured. Comments: ETT exchanged by Dr Constance Holster; Pt tolerated well

## 2017-06-16 NOTE — Progress Notes (Signed)
eLink Physician-Brief Progress Note Patient Name: Eliu Batch DOB: 1945/07/06 MRN: [id number]   Date of Service  06/16/2017  HPI/Events of Note  Elevated SBP and MAP  eICU Interventions  Prn Labetalol order for SBP > 160 or MAP > 90        Norah Devin U Caffie Sotto 06/16/2017, 9:03 PM

## 2017-06-16 NOTE — Progress Notes (Signed)
Name: Vincent Hansen MRN: [id number] DOB: [birth year]    ADMISSION DATE:  05/30/2017 CONSULTATION DATE:  3/17  REFERRING MD :  Dr. Constance Holster  CHIEF COMPLAINT:  Laryngeal mass  HISTORY OF PRESENT ILLNESS: 72 year old male who presented 3/17 with altered mental status.  On initial exam he was found to have inspiratory stridor which turned out to be caused by laryngeal mass.  He had emergent tracheostomy performed.  He was on the ventilator for a very short period and was able to liberate quickly.  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 Course complicated by a dislodged tracheostomy on 3/27 requiring transfer back to ICU and replaced tube. PEG placed. He had been tolerating ATC well and was working with SLP towards [place]. He went to OR 4/3 for total laryngectomy. Post operatively he was sent to ICU for recovery. PCCM asked to assist with medical management.   SIGNIFICANT EVENTS  3/17 admit, emergent tracheostomy 3/19 off vent entirely 3/27 trach tube dislodged requiring emergent exchange.  4/3 total laryngectomy. To ICU post op.   STUDIES:  3/17 CT soft tissue neck: Moderate to severe narrowing of the supraglottic airway (numerically estimated at 85%) appears related to mass-like circumferential soft tissue. The appearance is suspicious for a laryngeal neoplasm but supraglottic inflammation or other etiology remains possible. 3/17 CT head: Normal for age non contrast CT appearance of the brain allowing for some motion artifact. Mild left posterior convexity scalp hematoma suspected. No underlying skull fracture. 3/17 CTA chest:  Supraglottic airway narrowing related to mass-like soft tissue which is circumferential but greater on the right. No lower cervical or thoracic inlet lymphadenopathy is evident. No acute pulmonary embolus identified. Small volume of retained or aspirated secretions in the trachea and both mainstem bronchi. Confluent dependent bilateral lower lobe opacity which is  beyond that typical of atelectasis. Underlying Emphysema. 3/26 CT soft tissue neck: RIGHT glottic and supraglottic mass, narrowing the airway, with suspected invasion the paraglottic fat on the RIGHT. Possible discontinuity/invasion of the RIGHT thyroid cartilage. Tracheostomy tube appears to be only partially within the trachea.  PAST MEDICAL HISTORY :   has a past medical history of Diabetes mellitus without complication ([place]) and Hypertension.  has a past surgical history that includes Hernia repair; Tracheostomy tube placement (N/A, 05/30/2017); Esophagogastroduodenoscopy (egd) with propofol (N/A, 06/07/2017); and IR GASTROSTOMY TUBE MOD SED (06/09/2017). Prior to Admission medications   Medication Sig Start Date End Date Taking? Authorizing Provider  albuterol (PROVENTIL HFA;VENTOLIN HFA) 108 (90 Base) MCG/ACT inhaler Inhale 1 puff into the lungs every 4 (four) hours as needed for wheezing or shortness of breath.   Yes [provider]   No Known Allergies  FAMILY HISTORY:  family history is not on file. SOCIAL HISTORY:  reports that he has quit smoking. He has never used smokeless tobacco. He reports that he does not drink alcohol or use drugs.  REVIEW OF SYSTEMS:   Limited due to inability to speak. Feels SOB better after suctioning. Pain in neck. NO complaints otherwise.   SUBJECTIVE:   VITAL SIGNS: Temp:  [97.6 F (36.4 C)-98.7 F (37.1 C)] 97.6 F (36.4 C) (04/03 1658) Pulse Rate:  [55-95] 82 (04/03 2006) Resp:  [10-18] 18 (04/03 2006) BP: (107-164)/(62-101) 158/81 (04/03 2006) SpO2:  [93 %-100 %] 100 % (04/03 2006) FiO2 (%):  [28 %-50 %] 35 % (04/03 2006)  PHYSICAL EXAMINATION: General:  Frail eldelry male in NAD Neuro:  Alert, oriented to self only it would seem.  HEENT:  Trach tube in place s/p laryngectomy.  Cardiovascular:  RRR< no MRG Lungs:  clear Abdomen:  Soft, non-tender, non-distended Musculoskeletal:  No acute deformity or ROM limitation Skin:   Grossly intact  Recent Labs  Lab 06/14/17 0516 06/15/17 0519 06/16/17 0420  NA 143 144 141  K 3.6 3.7 3.9  CL 100* 101 96*  CO2 35* 35* 34*  BUN 11 10 11   CREATININE 1.01 0.92 1.16  GLUCOSE 191* 185* 248*   Recent Labs  Lab 06/14/17 0516 06/15/17 0519 06/16/17 0420  HGB 11.2* 11.1* 10.9*  HCT 35.9* 35.3* 35.2*  WBC 5.1 5.6 4.4  PLT 244 270 239   No results found.  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 ENT primary and Oncology following. Planning follow up chemo/radiation. Tolerating tracheostomy collar. #6 cuffed portex in place. Complicated by pulmonary secretions.  - Wean FiO2 as tolerated. Weaning down quickly post op.  - Continued SLP cooperation. Hopefully will tolerate PMV soon.  - Would recommend chest PT once further out from surgery.  - Management per ENT  Nutrition: malnourished - Continue tube feeds - Continued SLP cooperation for swallowing evaluation.   Chronic periodontitis with bone loss - Evaluated by dental medicine 06/09/17. He deferred dental treatment pending results of laryngectomy.  Diabetes Mellitus - Continue lantus and SSI. CBG q 4 hours.   COPD without acute exacerbation - Conitnue Brovana and budesonide.   Concern for gastric malignancy.  - noted on CT 3/17. Will defer workup to oncology.   HTN - PRN antihypertensives  TRH to resume care 4/4 per my discussion with Dr. Stormy Card, AGACNP-BC [facility] Pulmonology/Critical Care Pager [phone] or [phone]  06/16/2017 8:44 PM

## 2017-06-16 NOTE — Progress Notes (Signed)
Pt received with cuffed 6.0 bivona Portex s/p surgery. Pt placed on 50% TC. Airway patent and secure, BVM@HOB , BBSH chest rise equal  Spare trach ordered by unit secretary, Shiley 6.0 XLT at bedside in the meantime.

## 2017-06-16 NOTE — Progress Notes (Signed)
Inpatient Diabetes Program Recommendations  AACE/ADA: New Consensus Statement on Inpatient Glycemic Control (2015)  Target Ranges:  Prepandial:   less than 140 mg/dL      Peak postprandial:   less than 180 mg/dL (1-2 hours)      Critically ill patients:  140 - 180 mg/dL   Lab Results  Component Value Date   GLUCAP 170 (H) 06/16/2017    Review of Glycemic Control Results for Vincent Hansen, Vincent Hansen (MRN [id number]) as of 06/16/2017 10:59  Ref. Range 06/15/2017 21:04 06/16/2017 01:31 06/16/2017 04:18 06/16/2017 07:58  Glucose-Capillary Latest Ref Range: 65 - 99 mg/dL 166 (H) 185 (H) 241 (H) 170 (H)   Diabetes history: Type 2 DM Outpatient Diabetes medications: none  Current orders for Inpatient glycemic control: Novolog 0-15 units Q4H, Lantus 10 units QHS  Inpatient Diabetes Program Recommendations:    Noted procedure scheduled for tomorrow and NPO. Will watch for additional recs.    No A1C noted, may want to consider repeating A1C?  Thanks, Bronson Curb, MSN, RNC-OB Diabetes Coordinator [phone] (8a-5p)

## 2017-06-16 NOTE — Care Management (Signed)
This is a no charge note  Pick up from PCCM  72 year old male with past medical history of hypertension, diabetes mellitus. Patient was admitted because of squamous cell larynx carcinoma. Patient is s/p of  laryngectomy by ENT. Patient is stable enough to be transferred back to our service per PCCM, Eddie Dibbles NP. We need to pick this pt in AM.   Ivor Costa, MD  [facility] Pager [phone]  If 7PM-7AM, please contact night-coverage [email or web address] Password [facility] 06/16/2017, 8:54 PM

## 2017-06-16 NOTE — Transfer of Care (Signed)
Immediate Anesthesia Transfer of Care Note  Patient: Vincent Hansen  Procedure(s) Performed: TOTAL LARYNGECTOMY (N/A )  Patient Location: ICU  Anesthesia Type:General  Level of Consciousness: awake and responds to stimulation  Airway & Oxygen Therapy: Patient Spontanous Breathing and Patient connected to tracheostomy mask oxygen  Post-op Assessment: Report given to RN and Post -op Vital signs reviewed and stable  Post vital signs: Reviewed and stable  Last Vitals:  Vitals Value Taken Time  BP 116/62 06/16/2017  4:55 PM  Temp    Pulse 93 06/16/2017  4:58 PM  Resp 17 06/16/2017  4:58 PM  SpO2 87 % 06/16/2017  4:58 PM  Vitals shown include unvalidated device data.  Last Pain:  Vitals:   06/16/17 1019  TempSrc:   PainSc: 10-Worst pain ever      Patients Stated Pain Goal: 4 (34/03/70 9643)  Complications: No apparent anesthesia complications

## 2017-06-16 NOTE — Progress Notes (Signed)
Upon entering patients room during rounds patient was found moving to the edge of bed and his laryngeal stoma had been pulled. Patient's SP02 did drop into the 80's all other VS's remained WNL's. Laryngeal stoma was put back in place and MD was notified. Dr Blenda Nicely came and assessed patient and assured staff that this situation was non-emergent post laryngectomy and educated staff on post laryngectomy care. Will continue to monitor patient at this time.

## 2017-06-16 NOTE — Progress Notes (Signed)
RT called to bedside as patient decannulated self. Pt remained stable throughout, trach reinserted with obturator used, no apparent trauma to the airway`noted. RN MD at bedside. Will cont to monitor.

## 2017-06-16 NOTE — Progress Notes (Signed)
RT note-Patient with many concerns about laryngectomy and the ability to speak after the surgery. Patient is very frustrated. RN aware. Continue to monitor.

## 2017-06-17 DIAGNOSIS — R0603 Acute respiratory distress: Secondary | ICD-10-CM

## 2017-06-17 DIAGNOSIS — Z9002 Acquired absence of larynx: Secondary | ICD-10-CM

## 2017-06-17 DIAGNOSIS — R1310 Dysphagia, unspecified: Secondary | ICD-10-CM

## 2017-06-17 DIAGNOSIS — J9600 Acute respiratory failure, unspecified whether with hypoxia or hypercapnia: Secondary | ICD-10-CM

## 2017-06-17 LAB — BLOOD GAS, VENOUS
Acid-Base Excess: 12 mmol/L — ABNORMAL HIGH (ref 0.0–2.0)
Bicarbonate: 37.5 mmol/L — ABNORMAL HIGH (ref 20.0–28.0)
O2 Saturation: 85.5 %
PATIENT TEMPERATURE: 98.6
PH VEN: 7.382 (ref 7.250–7.430)
pCO2, Ven: 64.6 mmHg — ABNORMAL HIGH (ref 44.0–60.0)
pO2, Ven: 54.5 mmHg — ABNORMAL HIGH (ref 32.0–45.0)

## 2017-06-17 LAB — GLUCOSE, CAPILLARY
GLUCOSE-CAPILLARY: 127 mg/dL — AB (ref 65–99)
GLUCOSE-CAPILLARY: 162 mg/dL — AB (ref 65–99)
Glucose-Capillary: 104 mg/dL — ABNORMAL HIGH (ref 65–99)
Glucose-Capillary: 126 mg/dL — ABNORMAL HIGH (ref 65–99)
Glucose-Capillary: 139 mg/dL — ABNORMAL HIGH (ref 65–99)

## 2017-06-17 LAB — CULTURE, RESPIRATORY

## 2017-06-17 LAB — CULTURE, RESPIRATORY W GRAM STAIN

## 2017-06-17 MED ORDER — FUROSEMIDE 10 MG/ML IJ SOLN
20.0000 mg | Freq: Once | INTRAMUSCULAR | Status: AC
  Administered 2017-06-17: 20 mg via INTRAVENOUS
  Filled 2017-06-17: qty 2

## 2017-06-17 NOTE — Progress Notes (Signed)
Physical Therapy Treatment Patient Details Name: Vincent Hansen MRN: [id number] DOB: Sep 07, 1945 Today's Date: 06/17/2017    History of Present Illness 72 year old with past medical history relevant for hypertension, COPD, type 2 diabetes, prior history of squamous cell carcinoma of the larynx admitted with hypoxia and found to have large laryngeal mass obstructing airway status post emergency tracheostomy placement with 6 Shiley tracheostomy on 05/30/2017 status post extubation on 05/31/2017. Due to continued aspiration risk, he is kept NPO and PEG was placed for nutrition. ENT planning for total laryngectomy     PT Comments    Pt interested in getting up and trying ambulation today.  He was able to walk on RA and maintained>90% overall.  EHR in the low 90's.  There was little noticeable fatigue.   Follow Up Recommendations  SNF;Supervision for mobility/OOB     Equipment Recommendations  Rolling walker with 5" wheels    Recommendations for Other Services       Precautions / Restrictions Precautions Precautions: Fall Precaution Comments: trach Restrictions Weight Bearing Restrictions: No    Mobility  Bed Mobility Overal bed mobility: Needs Assistance Bed Mobility: Supine to Sit     Supine to sit: Min guard;HOB elevated     General bed mobility comments: leaned forward from raised HOB and scooted assymetrically to EOB without assist  Transfers Overall transfer level: Needs assistance Equipment used: None Transfers: Sit to/from Stand Sit to Stand: Min assist;+2 safety/equipment         General transfer comment: UE assist, but otherwise no assist  Ambulation/Gait Ambulation/Gait assistance: Min assist Ambulation Distance (Feet): 95 Feet Assistive device: Rolling walker (2 wheeled) Gait Pattern/deviations: Step-through pattern   Gait velocity interpretation: at or above normal speed for age/gender General Gait Details: generally steady with pt maneuvering the RW  himself relatively safely.  At times stepping out of and walking outside the RW.   Stairs            Wheelchair Mobility    Modified Rankin (Stroke Patients Only)       Balance Overall balance assessment: Needs assistance   Sitting balance-Leahy Scale: Good       Standing balance-Leahy Scale: Fair Standing balance comment: But pt did ask to use RW today.                            Cognition Arousal/Alertness: Awake/alert Behavior During Therapy: WFL for tasks assessed/performed Overall Cognitive Status: No family/caregiver present to determine baseline cognitive functioning                                 General Comments: written communication making sense today      Exercises      General Comments General comments (skin integrity, edema, etc.): Amb on RA, pt's sats were maintained in the low 90's and EHR stayed around 93 bpm.      Pertinent Vitals/Pain Pain Assessment: Faces Faces Pain Scale: Hurts little more Pain Location: neck incisions Pain Descriptors / Indicators: Sore Pain Intervention(s): Monitored during session    Home Living                      Prior Function            PT Goals (current goals can now be found in the care plan section) Acute Rehab PT Goals Patient Stated Goal: none stated PT  Goal Formulation: Patient unable to participate in goal setting Time For Goal Achievement: 06/24/17 Potential to Achieve Goals: Fair Progress towards PT goals: Progressing toward goals    Frequency    Min 2X/week      PT Plan Current plan remains appropriate    Co-evaluation              AM-PAC PT "6 Clicks" Daily Activity  Outcome Measure  Difficulty turning over in bed (including adjusting bedclothes, sheets and blankets)?: A Lot Difficulty moving from lying on back to sitting on the side of the bed? : Unable Difficulty sitting down on and standing up from a chair with arms (e.g., wheelchair,  bedside commode, etc,.)?: Unable Help needed moving to and from a bed to chair (including a wheelchair)?: A Little Help needed walking in hospital room?: A Little Help needed climbing 3-5 steps with a railing? : A Lot 6 Click Score: 12    End of Session   Activity Tolerance: Patient limited by fatigue Patient left: in bed;with call bell/phone within reach;with bed alarm set Nurse Communication: Mobility status PT Visit Diagnosis: Difficulty in walking, not elsewhere classified (R26.2)     Time: 3299-2426 PT Time Calculation (min) (ACUTE ONLY): 33 min  Charges:  $Gait Training: 8-22 mins $Therapeutic Activity: 8-22 mins                    G Codes:       07/09/17  Donnella Sham, PT [phone]  (pager)   Tessie Fass Silver [place] July 09, 2017, 2:39 PM

## 2017-06-17 NOTE — Anesthesia Postprocedure Evaluation (Signed)
Anesthesia Post Note  Patient: Vincent Hansen  Procedure(s) Performed: TOTAL LARYNGECTOMY (N/A )     Anesthesia Post Evaluation  Last Vitals:  Vitals:   06/17/17 0800 06/17/17 0829  BP: 112/63   Pulse: 72   Resp: 16   Temp:  36.7 C  SpO2: 98%     Last Pain:  Vitals:   06/17/17 0815  TempSrc:   PainSc: 9                  Gerhard Rappaport DAVID

## 2017-06-17 NOTE — Progress Notes (Signed)
PROGRESS NOTE    Vincent Hansen  TGY:[id number] DOB: 04-Oct-1945 DOA: 05/30/2017 PCP: Patient, No Pcp Per   Brief Narrative:  72 year old with past medical history relevant for hypertension, COPD, type 2 diabetes, prior history of squamous cell carcinoma of the larynx admitted with hypoxia and found to have large laryngeal mass obstructing airway status post emergency tracheostomy placement with 6 Shiley tracheostomy on 05/30/2017 status post extubation on 05/31/2017. Due to continued aspiration risk, he is kept NPO and PEG was placed for nutrition. Ttotal laryngectomy 4/3 by ENT.  Assessment & Plan   Acute respiratory failure with hypoxia secondary to squamous cell carcinoma of the larynx/severe oropharyngeal dysphasia -#6 XLT trach s/p tracheostomy -on 05/30/2017 -Patient had PEG tube placed on 06/09/2017 with initiation of feeding -ENT consulted, status post total laryngectomy on 06/16/2017 -Oncology consulted and appreciated, to prescribe adjuvant systemic chemotherapy and radiation -Speech therapy will continue to follow -Patient does have copious secretions -Tracheal aspirate showed abundant Pseudomonas aeruginosa  -Plan is to be n.p.o. for 7 days and possibly start patient on liquids at that time  Chronic periodontitis with bone loss  -Patient evaluated by dental medicine on 06/09/2017 and deferred dental treatment pending results of laryngectomy  Hiatal hernia -CT the abdomen showed concern for possible gastric adenocarcinoma versus hiatal hernia -Patient had EGD on 06/07/2017 which showed no evidence of gastric adenocarcinoma but did show medium sized hiatal hernia -Continue PPI  Essential hypertension -Continue labetalol as needed, BP currently controlled  Diabetes mellitus, type II -Continue Lantus, insulin sliding scale, CBG monitoring  COPD -Currently without exacerbation, no wheezing -Continue around on Pulmicort, pulmonary toilet postoperatively  Malnutrition of moderate  degree -Patient has PEG tube in place and is currently on tube feeds -Plan is for n.p.o. for 7 days at which point ENT will reevaluate patient to see if he can be on clear liquids  DVT Prophylaxis  SCDs  Code Status: Full  Family Communication: None at bedside  Disposition Plan: Admitted. Dispo to SNF, however not for several weeks.  Consultants ENT Interventional radiology Gastroenterology Oncology Dental surgery  Procedures  Emergent trach and laryngoscopy on 05/30/2017 by Dr. Constance Holster EGD on 06/07/2017 Total laryngectomy on 06/16/2017  Antibiotics   Anti-infectives (From admission, onward)   Start     Dose/Rate Route Frequency Ordered Stop   06/16/17 1800  clindamycin (CLEOCIN) IVPB 600 mg     600 mg 100 mL/hr over 30 Minutes Intravenous Every 8 hours 06/16/17 1658 06/18/17 2159   06/16/17 0000  clindamycin (CLEOCIN) IVPB 900 mg     900 mg 100 mL/hr over 30 Minutes Intravenous On call to O.R. 06/15/17 1716 06/16/17 1307   06/09/17 1500  ceFAZolin (ANCEF) IVPB 2g/100 mL premix     2 g 200 mL/hr over 30 Minutes Intravenous To Radiology 06/08/17 1427 06/09/17 1410      Subjective:   Boyd Peatross seen and examined today.  Patient currently difficult to understand due to laryngectomy.  Has been writing in order to communicate.  Patient is frustrated that he cannot speak and feels that all physicians and nurses should meet at the same time to discuss his care.  Denies current chest pain or shortness of breath.  Does complain of pain.    Objective:   Vitals:   06/17/17 1206 06/17/17 1300 06/17/17 1341 06/17/17 1400  BP:  116/69  127/73  Pulse:  69  71  Resp:  20  18  Temp: 98.6 F (37 C)     TempSrc:  SpO2:  98% 98% 97%  Weight:      Height:        Intake/Output Summary (Last 24 hours) at 06/17/2017 1529 Last data filed at 06/17/2017 1400 Gross per 24 hour  Intake 3694.16 ml  Output 1162 ml  Net 2532.16 ml   Filed Weights   06/14/17 0518 06/15/17 0448  06/17/17 0437  Weight: 73.9 kg (162 lb 14.7 oz) 74.6 kg (164 lb 7.4 oz) 78.7 kg (173 lb 8 oz)    Exam  General: Well developed, Chronically ill appearing, NAD  HEENT: NCAT, mucous membranes moist. Poor dentition  Neck: Trach  Cardiovascular: S1 S2 auscultated, no rubs, murmurs or gallops. Regular rate and rhythm.  Respiratory: Upper airway rhonchi  Abdomen: Soft, nontender, nondistended, + bowel sounds, peg tube  Extremities: warm dry without cyanosis clubbing or edema  Neuro: AAOx3, unable to speak, but has written communication, moves all extremities with ease  Skin: Without rashes exudates or nodules  Psych: Agitated and irritable    Data Reviewed: I have personally reviewed following labs and imaging studies  CBC: Recent Labs  Lab 06/12/17 0501 06/13/17 0440 06/14/17 0516 06/15/17 0519 06/16/17 0420  WBC 8.1 5.9 5.1 5.6 4.4  HGB 12.2* 11.6* 11.2* 11.1* 10.9*  HCT 39.1 36.2* 35.9* 35.3* 35.2*  MCV 86.7 85.8 86.1 85.9 86.9  PLT 265 232 244 270 465   Basic Metabolic Panel: Recent Labs  Lab 06/12/17 0501 06/13/17 0440 06/14/17 0516 06/15/17 0519 06/16/17 0420  NA 146* 143 143 144 141  K 3.9 3.8 3.6 3.7 3.9  CL 103 102 100* 101 96*  CO2 35* 32 35* 35* 34*  GLUCOSE 137* 211* 191* 185* 248*  BUN '16 12 11 10 11  ' CREATININE 1.23 0.98 1.01 0.92 1.16  CALCIUM 9.2 9.0 8.8* 9.0 8.9  MG 1.7 1.6* 1.7 1.5* 1.9  PHOS 2.4* 2.2* 2.7 2.8 3.2   GFR: Estimated Creatinine Clearance: 64.1 mL/min (by C-G formula based on SCr of 1.16 mg/dL). Liver Function Tests: No results for input(s): AST, ALT, ALKPHOS, BILITOT, PROT, ALBUMIN in the last 168 hours. No results for input(s): LIPASE, AMYLASE in the last 168 hours. No results for input(s): AMMONIA in the last 168 hours. Coagulation Profile: No results for input(s): INR, PROTIME in the last 168 hours. Cardiac Enzymes: No results for input(s): CKTOTAL, CKMB, CKMBINDEX, TROPONINI in the last 168 hours. BNP (last 3  results) No results for input(s): PROBNP in the last 8760 hours. HbA1C: No results for input(s): HGBA1C in the last 72 hours. CBG: Recent Labs  Lab 06/16/17 1946 06/16/17 2338 06/17/17 0344 06/17/17 0832 06/17/17 1210  GLUCAP 150* 189* 104* 127* 126*   Lipid Profile: No results for input(s): CHOL, HDL, LDLCALC, TRIG, CHOLHDL, LDLDIRECT in the last 72 hours. Thyroid Function Tests: No results for input(s): TSH, T4TOTAL, FREET4, T3FREE, THYROIDAB in the last 72 hours. Anemia Panel: No results for input(s): VITAMINB12, FOLATE, FERRITIN, TIBC, IRON, RETICCTPCT in the last 72 hours. Urine analysis:    Component Value Date/Time   COLORURINE YELLOW 05/30/2017 1330   APPEARANCEUR CLEAR 05/30/2017 1330   LABSPEC 1.020 05/30/2017 1330   PHURINE 5.0 05/30/2017 1330   GLUCOSEU >=500 (A) 05/30/2017 1330   HGBUR MODERATE (A) 05/30/2017 1330   BILIRUBINUR NEGATIVE 05/30/2017 1330   KETONESUR 5 (A) 05/30/2017 1330   PROTEINUR >=300 (A) 05/30/2017 1330   NITRITE NEGATIVE 05/30/2017 1330   LEUKOCYTESUR TRACE (A) 05/30/2017 1330   Sepsis Labs: '@LABRCNTIP' (procalcitonin:4,lacticidven:4)  ) Recent Results (from the  past 240 hour(s))  Culture, respiratory (NON-Expectorated)     Status: None   Collection Time: 06/14/17  3:30 PM  Result Value Ref Range Status   Specimen Description   Final    TRACHEAL ASPIRATE Performed at [facility] [address], [place], [place] [address]    Special Requests   Final    NONE Performed at [facility], [place] [address] [place], [place] [address]    Gram Stain   Final    ABUNDANT WBC PRESENT,BOTH PMN AND MONONUCLEAR MODERATE GRAM VARIABLE ROD FEW GRAM POSITIVE COCCI Performed at [facility], [place] [address] [place], [place] [address]    Culture ABUNDANT PSEUDOMONAS AERUGINOSA  Final   Report Status 06/17/2017 FINAL  Final   Organism ID, Bacteria PSEUDOMONAS AERUGINOSA  Final      Susceptibility    Pseudomonas aeruginosa - MIC*    CEFTAZIDIME 4 SENSITIVE Sensitive     CIPROFLOXACIN <=0.25 SENSITIVE Sensitive     GENTAMICIN 2 SENSITIVE Sensitive     IMIPENEM 1 SENSITIVE Sensitive     PIP/TAZO 8 SENSITIVE Sensitive     CEFEPIME 2 SENSITIVE Sensitive     * ABUNDANT PSEUDOMONAS AERUGINOSA  Surgical pcr screen     Status: None   Collection Time: 06/15/17  5:15 PM  Result Value Ref Range Status   MRSA, PCR NEGATIVE NEGATIVE Final   Staphylococcus aureus NEGATIVE NEGATIVE Final    Comment: (NOTE) The Xpert SA Assay (FDA approved for NASAL specimens in patients 12 years of age and older), is one component of a comprehensive surveillance program. It is not intended to diagnose infection nor to guide or monitor treatment. Performed at [facility], [place] [address], [place], [place] [address]       Radiology Studies: No results found.   Scheduled Meds: . arformoterol  15 mcg Nebulization BID  . bacitracin  1 application Topical F4F  . budesonide (PULMICORT) nebulizer solution  0.5 mg Nebulization BID  . chlorhexidine gluconate (MEDLINE KIT)  15 mL Mouth Rinse BID  . docusate  100 mg Per Tube BID  . feeding supplement (PRO-STAT SUGAR FREE 64)  30 mL Per Tube Daily  . free water  100 mL Per Tube TID  . insulin aspart  0-15 Units Subcutaneous Q4H  . insulin glargine  10 Units Subcutaneous QHS  . ipratropium-albuterol  3 mL Nebulization Q6H  . ketorolac  15 mg Intravenous Q6H  . mouth rinse  15 mL Mouth Rinse QID  . morphine CONCENTRATE  10 mg Per Tube Q8H  . pantoprazole sodium  40 mg Per Tube Daily  . sennosides  5 mL Per Tube QHS   Continuous Infusions: . clindamycin (CLEOCIN) IV Stopped (06/17/17 4239)  . feeding supplement (JEVITY 1.5 CAL/FIBER) 1,000 mL (06/16/17 2320)  . lactated ringers 75 mL/hr at 06/17/17 1400     LOS: 18 days   Time Spent in minutes   30 minutes  Shanette Tamargo D.O. on 06/17/2017 at 3:29 PM  Between 7am to 7pm - Pager -  [phone]  After 7pm go to [email or web address] - password [email or web address]  And look for the night coverage person covering for me after hours  [facility] Office  [phone]

## 2017-06-17 NOTE — Progress Notes (Signed)
[address] Progress Note Patient Name: Ryman Rathgeber DOB: 05-12-1945 MRN: [id number]   Date of Service  06/17/2017  HPI/Events of Note  Low UOP with significant positive fluid balance  eICU Interventions  Lasix 20 mg iv x 1        Marlise Fahr U Anh Mangano 06/17/2017, 4:21 AM

## 2017-06-17 NOTE — Progress Notes (Signed)
  Speech Language Pathology Treatment: Cognitive-Linquistic  Patient Details Name: Vincent Hansen MRN: [id number] DOB: 1946-01-19 Today's Date: 06/17/2017 Time: 4098-1191 SLP Time Calculation (min) (ACUTE ONLY): 30 min  Assessment / Plan / Recommendation Clinical Impression  Pt known to this SLP from [facility] where laryngectomy education and EL treatment was initiated.  Today pt provided with [facility] EL to practice today - advised him that SLP will retreive later today. Pt required total cues to slow rate, over-articulate and time vibration with articulation.  He was clear in stating "Hello" "How are you?" However with novel communication/expression, intelligibility is severely reduced.  Pt put his hand up to request SLP to wait when SLP requested he write his statement to allow this clinician to help to improve his articulation.  Unfortunately pt did not write his statement despite multiple requests.    Barrier to pt's progression with EL is his frustration however with practice anticipate he will improve significantly.   SLP left letter of necessity for EL in pt's ghost chart and informed RN and case manager *via phone message* to said information.  Requested case manager Roselyn Reef to print other required documentation for pt to receive EL.  Will follow up for further laryngectomy education/EL treatment.   Thanks again for this consult.   Marland Kitchen    HPI HPI: pt is a 72 yo adm to hospital with AMS, found to have a supraglottic laryngeal mass with narrowing up to 85%, mild thickening of epiglottis but other structures patent.  Pt is s/p emergent trach and order for swallow/PMSV evaluations received. CXR showed Low lung volumes with areas of scarring and/or subsegmental atelectasis in the lower lobes of the lungs bilaterally.     Pt has been seen for education re: laryngectomy. Today provided with Electrolarynx to "practice" prior to scheduled procedure 06/16/2017.  Pt reports frustration at not being able "to speak" -  educated him that due to his copious secretions and ? impact of XL trach  - he has been unable to use PMSV.  In addition, reeducated him to plan to have laryngectomy performed on Wednesday - therefore need to practice with electrolarynx.        SLP Plan  Continue with current plan of care       Recommendations  Medication Administration: Via alternative means                Oral Care Recommendations: Oral care QID Follow up Recommendations: (tbd) SLP Visit Diagnosis: Aphonia (R49.1);Other (comment)(laryngectomy planned for next week) Plan: Continue with current plan of care       [place], [place] Wilbon Ann 06/17/2017, 9:58 AM   Luanna Salk, [facility] SLP [phone]

## 2017-06-17 NOTE — Anesthesia Postprocedure Evaluation (Signed)
Anesthesia Post Note  Patient: Vincent Hansen  Procedure(s) Performed: TOTAL LARYNGECTOMY (N/A )     Patient location during evaluation: PACU Anesthesia Type: General Level of consciousness: awake and alert Pain management: pain level controlled Vital Signs Assessment: post-procedure vital signs reviewed and stable Respiratory status: spontaneous breathing, nonlabored ventilation, respiratory function stable and patient connected to nasal cannula oxygen Cardiovascular status: blood pressure returned to baseline and stable Postop Assessment: no apparent nausea or vomiting Anesthetic complications: no    Last Vitals:  Vitals:   06/17/17 0800 06/17/17 0829  BP: 112/63   Pulse: 72   Resp: 16   Temp:  36.7 C  SpO2: 98%     Last Pain:  Vitals:   06/17/17 0815  TempSrc:   PainSc: 9                  Liliauna Santoni DAVID

## 2017-06-17 NOTE — Progress Notes (Signed)
Spoke with E-link MD about patient's low urine output. New orders received. Will continue to monitor.

## 2017-06-17 NOTE — Progress Notes (Signed)
Patient ID: Vincent Hansen, male   DOB: 1946/02/24, 72 y.o.   MRN: [id number] Subjective: Doing well overall, complaining of some pain.  No difficulty breathing.  Objective: Vital signs in last 24 hours: Temp:  [97.6 F (36.4 C)-98.5 F (36.9 C)] 98.1 F (36.7 C) (04/04 0829) Pulse Rate:  [60-95] 72 (04/04 0800) Resp:  [10-18] 16 (04/04 0800) BP: (95-164)/(62-101) 112/63 (04/04 0800) SpO2:  [93 %-100 %] 98 % (04/04 0800) FiO2 (%):  [28 %-50 %] 28 % (04/04 0800) Weight:  [78.7 kg (173 lb 8 oz)] 78.7 kg (173 lb 8 oz) (04/04 0437) Weight change:  Last BM Date: 06/13/17  Intake/Output from previous day: 04/03 0701 - 04/04 0700 In: 3999.2 [I.V.:3522.5; NG/GT:176.7; IV Piggyback:200] Out: 1087 [Urine:822; Drains:115; Blood:150] Intake/Output this shift: Total I/O In: 85 [I.V.:75; NG/GT:10] Out: 100 [Urine:100]  PHYSICAL EXAM: Neck looks excellent.  JP drains are functioning well.  She will was holding.  Tracheostomy tube in place.  Breathing is clear.  Minimal secretions.  Significant improvement over preop.  Lab Results: Recent Labs    06/15/17 0519 06/16/17 0420  WBC 5.6 4.4  HGB 11.1* 10.9*  HCT 35.3* 35.2*  PLT 270 239   BMET Recent Labs    06/15/17 0519 06/16/17 0420  NA 144 141  K 3.7 3.9  CL 101 96*  CO2 35* 34*  GLUCOSE 185* 248*  BUN 10 11  CREATININE 0.92 1.16  CALCIUM 9.0 8.9    Studies/Results: No results found.  Medications: I have reviewed the patient's current medications.  Assessment/Plan: Postop day 1, total laryngectomy, doing well so far.  Continue medical monitoring by CCM/hospitalist service.  If he is doing well on day 7 we will start him on oral liquids.  Otherwise he will remain n.p.o. until then.  Tube feeding has been started with gastrostomy.  Continue care.  LOS: 18 days   [name] 06/17/2017, 8:39 AM

## 2017-06-18 ENCOUNTER — Encounter (HOSPITAL_COMMUNITY): Payer: Self-pay | Admitting: Otolaryngology

## 2017-06-18 ENCOUNTER — Inpatient Hospital Stay (HOSPITAL_COMMUNITY): Payer: Non-veteran care

## 2017-06-18 LAB — BASIC METABOLIC PANEL
Anion gap: 8 (ref 5–15)
BUN: 20 mg/dL (ref 6–20)
CALCIUM: 8.2 mg/dL — AB (ref 8.9–10.3)
CO2: 32 mmol/L (ref 22–32)
CREATININE: 1.23 mg/dL (ref 0.61–1.24)
Chloride: 98 mmol/L — ABNORMAL LOW (ref 101–111)
GFR calc non Af Amer: 57 mL/min — ABNORMAL LOW (ref >60)
Glucose, Bld: 178 mg/dL — ABNORMAL HIGH (ref 65–99)
Potassium: 4 mmol/L (ref 3.5–5.1)
Sodium: 138 mmol/L (ref 135–145)

## 2017-06-18 LAB — CBC
HEMATOCRIT: 30.5 % — AB (ref 39.0–52.0)
Hemoglobin: 9.6 g/dL — ABNORMAL LOW (ref 13.0–17.0)
MCH: 27.4 pg (ref 26.0–34.0)
MCHC: 31.5 g/dL (ref 30.0–36.0)
MCV: 86.9 fL (ref 78.0–100.0)
Platelets: 210 10*3/uL (ref 150–400)
RBC: 3.51 MIL/uL — ABNORMAL LOW (ref 4.22–5.81)
RDW: 14.2 % (ref 11.5–15.5)
WBC: 6.2 10*3/uL (ref 4.0–10.5)

## 2017-06-18 LAB — GLUCOSE, CAPILLARY
GLUCOSE-CAPILLARY: 156 mg/dL — AB (ref 65–99)
GLUCOSE-CAPILLARY: 167 mg/dL — AB (ref 65–99)
GLUCOSE-CAPILLARY: 297 mg/dL — AB (ref 65–99)
Glucose-Capillary: 139 mg/dL — ABNORMAL HIGH (ref 65–99)
Glucose-Capillary: 179 mg/dL — ABNORMAL HIGH (ref 65–99)
Glucose-Capillary: 146 mg/dL — ABNORMAL HIGH (ref 65–99)

## 2017-06-18 MED ORDER — BLISTEX MEDICATED EX OINT
TOPICAL_OINTMENT | CUTANEOUS | Status: DC | PRN
  Filled 2017-06-18: qty 6.3

## 2017-06-18 MED ORDER — JEVITY 1.5 CAL/FIBER PO LIQD
355.0000 mL | Freq: Four times a day (QID) | ORAL | Status: DC
  Administered 2017-06-18 – 2017-06-30 (×45): 355 mL
  Filled 2017-06-18 (×55): qty 1000

## 2017-06-18 MED ORDER — FREE WATER
120.0000 mL | Freq: Four times a day (QID) | Status: DC
  Administered 2017-06-18 – 2017-06-23 (×18): 120 mL

## 2017-06-18 NOTE — Progress Notes (Signed)
Left message for April, [place] transfer coordinator at [facility], to confirm admission and to obtain PCP and discharge planning information.  Pt needs electrolarynx, and we will likely need to go through the [place] for this.  I am uncertain of their process for obtaining EL; will update when this is known.    Reinaldo Raddle, RN, BSN  Trauma/Neuro ICU Case Manager [phone]

## 2017-06-18 NOTE — Progress Notes (Signed)
Nutrition Follow-up  DOCUMENTATION CODES:   Non-severe (moderate) malnutrition in context of chronic illness  INTERVENTION:   Transition to bolus TF via PEG: 1.5 cans (355 ml) QID 30 ml Prostat daily 60 ml free water before and after each bolus feeding  Provides: 2230 kcal, 105 grams protein, and 1080 ml free water Total free water: 1860 ml   NUTRITION DIAGNOSIS:   Moderate Malnutrition related to chronic illness(COPD, Type 2 DM) as evidenced by moderate muscle depletion, moderate fat depletion. Ongoing.   GOAL:   Patient will meet greater than or equal to 90% of their needs Progressing.   MONITOR:   TF tolerance, Labs, Weight trends, Skin  ASSESSMENT:   Pt with PMH of HTN, COPD, type 2 DM, prior hx of squamous cell ca of larynx admitted with hypoxia and found to have large laryngeal mass obstructing airway s/p emergent trach 3/17. Total laryngectomy 4/3.   Pt discussed during ICU rounds and with RN.  Per RN, ENT would like RD to adjust to bolus feedings Remains on trach collar Pt difficult to understand at times.   3/17 trach placed 3/27 PEG 3/28 TF started but advanced slowly. Due to order confusion TF rate advanced to 40 ml/hr but had not advanced to 60 ml/hr until today 4/5 at 1200 4/3 total laryngectomy 4/10 plan to start liquid diet per ENT   Medications reviewed and include: colace, novolog, lantus, senokot  UOP 400 ml x 24 hr 21 L positive  Drains x 2 with 12 ml of total output Weight has varied from 168 lb to now 180 lb   Free water 100 ml TID   Diet Order:  Diet NPO time specified  EDUCATION NEEDS:   No education needs have been identified at this time  Skin:  Skin Assessment: Skin Integrity Issues: Skin Integrity Issues:: Incisions Incisions: s/p PEG placement (abdomen)  Last BM:  3/31  Height:   Ht Readings from Last 1 Encounters:  06/10/17 6' (1.829 m)    Weight:   Wt Readings from Last 1 Encounters:  06/18/17 180 lb 8.9 oz (81.9  kg)    Ideal Body Weight:  80.91 kg  BMI:  Body mass index is 24.49 kg/m.  Estimated Nutritional Needs:   Kcal:  2100-2300 kcal (~30-33 kcal/kg)  Protein:  105-115 grams (20% kcal)  Fluid:  >=2.1 L/day  Maylon Peppers RD, LDN, CNSC [phone] Pager [phone] After Hours Pager

## 2017-06-18 NOTE — Progress Notes (Signed)
Patient ID: Vincent Hansen, male   DOB: Aug 05, 1945, 72 y.o.   MRN: [id number] Subjective: Awake and alert, no complaints, breathing nice and easily.  Objective: Vital signs in last 24 hours: Temp:  [98 F (36.7 C)-99.4 F (37.4 C)] 98.2 F (36.8 C) (04/05 0800) Pulse Rate:  [55-88] 69 (04/05 1000) Resp:  [10-25] 12 (04/05 1000) BP: (96-142)/(55-85) 122/69 (04/05 1000) SpO2:  [87 %-100 %] 100 % (04/05 1000) FiO2 (%):  [28 %] 28 % (04/05 0800) Weight:  [81.9 kg (180 lb 8.9 oz)] 81.9 kg (180 lb 8.9 oz) (04/05 0500) Weight change: 3.2 kg (7 lb 0.9 oz) Last BM Date: 06/13/17  Intake/Output from previous day: 04/04 0701 - 04/05 0700 In: 3555.8 [I.V.:2280.8; NG/GT:990; IV Piggyback:100] Out: 412 [Urine:400; Drains:12] Intake/Output this shift: Total I/O In: 500 [I.V.:500] Out: 100 [Urine:100]  PHYSICAL EXAM: Neck flat look excellent.  Minimal drainage in the JP bulbs.  Tracheostomy was removed, cleaned and replaced with fresh straps, and also shortened by about 1-1/2 cm.  Lab Results: Recent Labs    06/16/17 0420 06/18/17 0500  WBC 4.4 6.2  HGB 10.9* 9.6*  HCT 35.2* 30.5*  PLT 239 210   BMET Recent Labs    06/16/17 0420 06/18/17 0500  NA 141 138  K 3.9 4.0  CL 96* 98*  CO2 34* 32  GLUCOSE 248* 178*  BUN 11 20  CREATININE 1.16 1.23  CALCIUM 8.9 8.2*    Studies/Results: US Renal  Result Date: 06/18/2017 CLINICAL DATA:  Low urine output. EXAM: RENAL / URINARY TRACT ULTRASOUND COMPLETE COMPARISON:  None. FINDINGS: Right Kidney: Length: 11.7 cm. Echogenicity within normal limits. No hydronephrosis visualized. 4.7 cm simple cyst is seen in upper pole. 2.2 cm septated cyst is seen. Left Kidney: Length: 12.4 cm. Multiple cysts are noted with the largest measuring 3.2 cm. Echogenicity within normal limits. No mass or hydronephrosis visualized. Bladder: Bladder is nondistended due to Foley catheter. Enlarged prostate gland is noted. IMPRESSION: Multiple bilateral renal cysts  are noted. 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 No hydronephrosis or renal obstruction is noted. Foley catheter is noted.  Enlarged prostate gland is noted. Electronically Signed   By: Marijo Conception, M.D.   On: 06/18/2017 09:43    Medications: I have reviewed the patient's current medications.  Assessment/Plan: Plan is to remove the drains tomorrow. We will start him on oral liquids on Wednesday. I will see if I can order a laryngectomy tube which would be more comfortable and easier to care for. Continue tube feeds. Continue medical management of fluid status.  Can probably be transferred to the general floor tomorrow.  LOS: 19 days   [name] 06/18/2017, 11:01 AM

## 2017-06-18 NOTE — Progress Notes (Signed)
MD made aware of patient's low urine output. New orders received. Will continue to monitor.

## 2017-06-18 NOTE — Progress Notes (Signed)
Per Dr Aldine Contes request, I bladder scanned patient with reading of 60ml.  Output has been 180ml this shift.  Will continue to monitor low UO.  Will follow up with MD as to need of foley catheter. [place], [place]

## 2017-06-18 NOTE — Progress Notes (Signed)
PROGRESS NOTE    Vincent Hansen  ZOX:[id number] DOB: 05/24/1945 DOA: 05/30/2017 PCP: Patient, No Pcp Per   Brief Narrative:  72 year old with past medical history relevant for hypertension, COPD, type 2 diabetes, prior history of squamous cell carcinoma of the larynx admitted with hypoxia and found to have large laryngeal mass obstructing airway status post emergency tracheostomy placement with 6 Shiley tracheostomy on 05/30/2017 status post extubation on 05/31/2017. Due to continued aspiration risk, he is kept NPO and PEG was placed for nutrition. Total laryngectomy 4/3 by ENT.  Assessment & Plan   Acute respiratory failure with hypoxia secondary to squamous cell carcinoma of the larynx/severe oropharyngeal dysphasia -#6 XLT trach s/p tracheostomy -on 05/30/2017 -Patient had PEG tube placed on 06/09/2017 with initiation of feeding -ENT consulted, status post total laryngectomy on 06/16/2017 -Oncology consulted and appreciated, to prescribe adjuvant systemic chemotherapy and radiation -Speech therapy will continue to follow -Patient does have copious secretions -Tracheal aspirate showed abundant Pseudomonas aeruginosa  -Plan is to possibly start patient on liquids on 06/23/2017  Chronic periodontitis with bone loss  -Patient evaluated by dental medicine on 06/09/2017 and deferred dental treatment pending results of laryngectomy  Hiatal hernia -CT the abdomen showed concern for possible gastric adenocarcinoma versus hiatal hernia -Patient had EGD on 06/07/2017 which showed no evidence of gastric adenocarcinoma but did show medium sized hiatal hernia -Continue PPI  Essential hypertension -Continue labetalol as needed, BP currently controlled  Diabetes mellitus, type II -Continue Lantus, insulin sliding scale, CBG monitoring  COPD -Currently without exacerbation, no wheezing -Continue around on Pulmicort, pulmonary toilet postoperatively  Malnutrition of moderate degree -Patient has PEG  tube in place and is currently on tube feeds -Plan is for n.p.o. for 6 days at which point ENT will reevaluate patient to see if he can be on clear liquids  Mild oliguria -Continue foley catheter to monitor output -increased IVF to 125cc/hr along with tube feeds -Renal ultrasound obtained showing no hydronephrosis or obstruction.  Enlarged prostate gland noted.  Multiple bilateral renal cysts (follow-up ultrasound in 12 months recommended) -creatinine currently stable  DVT Prophylaxis  SCDs  Code Status: Full  Family Communication: None at bedside  Disposition Plan: Admitted. Dispo to SNF, however not for several weeks.  Consultants ENT Interventional radiology Gastroenterology Oncology Dental surgery  Procedures  Emergent trach and laryngoscopy on 05/30/2017 by Dr. Constance Holster EGD on 06/07/2017 Total laryngectomy on 06/16/2017  Antibiotics   Anti-infectives (From admission, onward)   Start     Dose/Rate Route Frequency Ordered Stop   06/16/17 1800  clindamycin (CLEOCIN) IVPB 600 mg     600 mg 100 mL/hr over 30 Minutes Intravenous Every 8 hours 06/16/17 1658 06/18/17 2159   06/16/17 0000  clindamycin (CLEOCIN) IVPB 900 mg     900 mg 100 mL/hr over 30 Minutes Intravenous On call to O.R. 06/15/17 1716 06/16/17 1307   06/09/17 1500  ceFAZolin (ANCEF) IVPB 2g/100 mL premix     2 g 200 mL/hr over 30 Minutes Intravenous To Radiology 06/08/17 1427 06/09/17 1410      Subjective:   Vincent Hansen seen and examined today.  Uses written communication. Currently denies chest pain, shortness breath, abdominal pain, nausea or vomiting, diarrhea or constipation. Upset that he cannot eat and states he has not eaten in 10 days.  Objective:   Vitals:   06/18/17 0900 06/18/17 1000 06/18/17 1100 06/18/17 1200  BP: 129/71 122/69 126/70 105/63  Pulse: 61 69 64 72  Resp: 19 12 (!) 21 20  Temp:    97.9 F (36.6 C)  TempSrc:    Oral  SpO2: 97% 100% 99% 97%  Weight:      Height:         Intake/Output Summary (Last 24 hours) at 06/18/2017 1326 Last data filed at 06/18/2017 1200 Gross per 24 hour  Intake 3855.84 ml  Output 467 ml  Net 3388.84 ml   Filed Weights   06/15/17 0448 06/17/17 0437 06/18/17 0500  Weight: 74.6 kg (164 lb 7.4 oz) 78.7 kg (173 lb 8 oz) 81.9 kg (180 lb 8.9 oz)   Exam  General: Well developed, chronically ill-appearing, NAD  HEENT: NCAT, mucous membranes moist.  Poor Dentition  Neck: Supple, Trach  Cardiovascular: S1 S2 auscultated, RRR, no murmurs   Respiratory: Clear to auscultation bilaterally with equal chest rise  Abdomen: Soft, nontender, nondistended, + bowel sounds, +peg   Extremities: warm dry without cyanosis clubbing or edema  Neuro: AAOx3, nonfocal  Psych: upset, agitated and irritable  Data Reviewed: I have personally reviewed following labs and imaging studies  CBC: Recent Labs  Lab 06/13/17 0440 06/14/17 0516 06/15/17 0519 06/16/17 0420 06/18/17 0500  WBC 5.9 5.1 5.6 4.4 6.2  HGB 11.6* 11.2* 11.1* 10.9* 9.6*  HCT 36.2* 35.9* 35.3* 35.2* 30.5*  MCV 85.8 86.1 85.9 86.9 86.9  PLT 232 244 270 239 071   Basic Metabolic Panel: Recent Labs  Lab 06/12/17 0501 06/13/17 0440 06/14/17 0516 06/15/17 0519 06/16/17 0420 06/18/17 0500  NA 146* 143 143 144 141 138  K 3.9 3.8 3.6 3.7 3.9 4.0  CL 103 102 100* 101 96* 98*  CO2 35* 32 35* 35* 34* 32  GLUCOSE 137* 211* 191* 185* 248* 178*  BUN '16 12 11 10 11 20  ' CREATININE 1.23 0.98 1.01 0.92 1.16 1.23  CALCIUM 9.2 9.0 8.8* 9.0 8.9 8.2*  MG 1.7 1.6* 1.7 1.5* 1.9  --   PHOS 2.4* 2.2* 2.7 2.8 3.2  --    GFR: Estimated Creatinine Clearance: 60.5 mL/min (by C-G formula based on SCr of 1.23 mg/dL). Liver Function Tests: No results for input(s): AST, ALT, ALKPHOS, BILITOT, PROT, ALBUMIN in the last 168 hours. No results for input(s): LIPASE, AMYLASE in the last 168 hours. No results for input(s): AMMONIA in the last 168 hours. Coagulation Profile: No results for  input(s): INR, PROTIME in the last 168 hours. Cardiac Enzymes: No results for input(s): CKTOTAL, CKMB, CKMBINDEX, TROPONINI in the last 168 hours. BNP (last 3 results) No results for input(s): PROBNP in the last 8760 hours. HbA1C: No results for input(s): HGBA1C in the last 72 hours. CBG: Recent Labs  Lab 06/17/17 2002 06/18/17 0021 06/18/17 0417 06/18/17 0735 06/18/17 1121  GLUCAP 162* 156* 139* 167* 179*   Lipid Profile: No results for input(s): CHOL, HDL, LDLCALC, TRIG, CHOLHDL, LDLDIRECT in the last 72 hours. Thyroid Function Tests: No results for input(s): TSH, T4TOTAL, FREET4, T3FREE, THYROIDAB in the last 72 hours. Anemia Panel: No results for input(s): VITAMINB12, FOLATE, FERRITIN, TIBC, IRON, RETICCTPCT in the last 72 hours. Urine analysis:    Component Value Date/Time   COLORURINE YELLOW 05/30/2017 1330   APPEARANCEUR CLEAR 05/30/2017 1330   LABSPEC 1.020 05/30/2017 1330   PHURINE 5.0 05/30/2017 1330   GLUCOSEU >=500 (A) 05/30/2017 1330   HGBUR MODERATE (A) 05/30/2017 1330   BILIRUBINUR NEGATIVE 05/30/2017 1330   KETONESUR 5 (A) 05/30/2017 1330   PROTEINUR >=300 (A) 05/30/2017 1330   NITRITE NEGATIVE 05/30/2017 1330   LEUKOCYTESUR TRACE (A) 05/30/2017  1330   Sepsis Labs: '@LABRCNTIP' (procalcitonin:4,lacticidven:4)  ) Recent Results (from the past 240 hour(s))  Culture, respiratory (NON-Expectorated)     Status: None   Collection Time: 06/14/17  3:30 PM  Result Value Ref Range Status   Specimen Description   Final    TRACHEAL ASPIRATE Performed at [facility] [address], [place], [place] [address]    Special Requests   Final    NONE Performed at [facility], [place] [address], [place], [place] [address]    Gram Stain   Final    ABUNDANT WBC PRESENT,BOTH PMN AND MONONUCLEAR MODERATE GRAM VARIABLE ROD FEW GRAM POSITIVE COCCI Performed at [facility], [place] [address], [place], [place] [address]    Culture  ABUNDANT PSEUDOMONAS AERUGINOSA  Final   Report Status 06/17/2017 FINAL  Final   Organism ID, Bacteria PSEUDOMONAS AERUGINOSA  Final      Susceptibility   Pseudomonas aeruginosa - MIC*    CEFTAZIDIME 4 SENSITIVE Sensitive     CIPROFLOXACIN <=0.25 SENSITIVE Sensitive     GENTAMICIN 2 SENSITIVE Sensitive     IMIPENEM 1 SENSITIVE Sensitive     PIP/TAZO 8 SENSITIVE Sensitive     CEFEPIME 2 SENSITIVE Sensitive     * ABUNDANT PSEUDOMONAS AERUGINOSA  Surgical pcr screen     Status: None   Collection Time: 06/15/17  5:15 PM  Result Value Ref Range Status   MRSA, PCR NEGATIVE NEGATIVE Final   Staphylococcus aureus NEGATIVE NEGATIVE Final    Comment: (NOTE) The Xpert SA Assay (FDA approved for NASAL specimens in patients 45 years of age and older), is one component of a comprehensive surveillance program. It is not intended to diagnose infection nor to guide or monitor treatment. Performed at [facility], [place] [address], [place],  [address]       Radiology Studies: US Renal  Result Date: 06/18/2017 CLINICAL DATA:  Low urine output. EXAM: RENAL / URINARY TRACT ULTRASOUND COMPLETE COMPARISON:  None. FINDINGS: Right Kidney: Length: 11.7 cm. Echogenicity within normal limits. No hydronephrosis visualized. 4.7 cm simple cyst is seen in upper pole. 2.2 cm septated cyst is seen. Left Kidney: Length: 12.4 cm. Multiple cysts are noted with the largest measuring 3.2 cm. Echogenicity within normal limits. No mass or hydronephrosis visualized. Bladder: Bladder is nondistended due to Foley catheter. Enlarged prostate gland is noted. IMPRESSION: Multiple bilateral renal cysts are noted. 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 No hydronephrosis or renal obstruction is noted. Foley catheter is noted.  Enlarged prostate gland is noted. Electronically Signed   By: Marijo Conception, M.D.    On: 06/18/2017 09:43     Scheduled Meds: . arformoterol  15 mcg Nebulization BID  . bacitracin  1 application Topical X1D  . budesonide (PULMICORT) nebulizer solution  0.5 mg Nebulization BID  . chlorhexidine gluconate (MEDLINE KIT)  15 mL Mouth Rinse BID  . docusate  100 mg Per Tube BID  . feeding supplement (PRO-STAT SUGAR FREE 64)  30 mL Per Tube Daily  . free water  100 mL Per Tube TID  . insulin aspart  0-15 Units Subcutaneous Q4H  . insulin glargine  10 Units Subcutaneous QHS  . ipratropium-albuterol  3 mL Nebulization Q6H  . ketorolac  15 mg Intravenous Q6H  . mouth rinse  15 mL Mouth Rinse QID  . morphine CONCENTRATE  10 mg Per Tube Q8H  . pantoprazole sodium  40 mg Per Tube Daily  . sennosides  5 mL Per Tube QHS   Continuous Infusions: . clindamycin (CLEOCIN) IV Stopped (06/18/17 9675)  . feeding supplement (JEVITY 1.5 CAL/FIBER) 60 mL/hr at 06/18/17 1200  . lactated ringers 125 mL/hr at 06/18/17 1200     LOS: 19 days   Time Spent in minutes   30 minutes  Tarryn Bogdan D.O. on 06/18/2017 at 1:26 PM  Between 7am to 7pm - Pager - [phone]  After 7pm go to [email or web address] - password [email or web address]  And look for the night coverage person covering for me after hours  [facility] Office  [phone]

## 2017-06-19 ENCOUNTER — Inpatient Hospital Stay (HOSPITAL_COMMUNITY): Payer: Non-veteran care

## 2017-06-19 LAB — POCT I-STAT 3, ART BLOOD GAS (G3+)
Acid-Base Excess: 10 mmol/L — ABNORMAL HIGH (ref 0.0–2.0)
BICARBONATE: 35.8 mmol/L — AB (ref 20.0–28.0)
O2 Saturation: 93 %
PO2 ART: 68 mmHg — AB (ref 83.0–108.0)
TCO2: 37 mmol/L — AB (ref 22–32)
pCO2 arterial: 54.8 mmHg — ABNORMAL HIGH (ref 32.0–48.0)
pH, Arterial: 7.425 (ref 7.350–7.450)

## 2017-06-19 LAB — BASIC METABOLIC PANEL
ANION GAP: 9 (ref 5–15)
BUN: 21 mg/dL — ABNORMAL HIGH (ref 6–20)
CO2: 31 mmol/L (ref 22–32)
Calcium: 8.2 mg/dL — ABNORMAL LOW (ref 8.9–10.3)
Chloride: 99 mmol/L — ABNORMAL LOW (ref 101–111)
Creatinine, Ser: 1.19 mg/dL (ref 0.61–1.24)
GFR calc Af Amer: >60 mL/min (ref >60)
GFR calc non Af Amer: 60 mL/min — ABNORMAL LOW (ref >60)
GLUCOSE: 198 mg/dL — AB (ref 65–99)
Potassium: 4.7 mmol/L (ref 3.5–5.1)
Sodium: 139 mmol/L (ref 135–145)

## 2017-06-19 LAB — CBC
HEMATOCRIT: 29.6 % — AB (ref 39.0–52.0)
Hemoglobin: 9.2 g/dL — ABNORMAL LOW (ref 13.0–17.0)
MCH: 26.9 pg (ref 26.0–34.0)
MCHC: 31.1 g/dL (ref 30.0–36.0)
MCV: 86.5 fL (ref 78.0–100.0)
Platelets: 208 10*3/uL (ref 150–400)
RBC: 3.42 MIL/uL — ABNORMAL LOW (ref 4.22–5.81)
RDW: 14 % (ref 11.5–15.5)
WBC: 7.8 10*3/uL (ref 4.0–10.5)

## 2017-06-19 LAB — GLUCOSE, CAPILLARY
GLUCOSE-CAPILLARY: 191 mg/dL — AB (ref 65–99)
GLUCOSE-CAPILLARY: 185 mg/dL — AB (ref 65–99)
Glucose-Capillary: 109 mg/dL — ABNORMAL HIGH (ref 65–99)
Glucose-Capillary: 205 mg/dL — ABNORMAL HIGH (ref 65–99)
Glucose-Capillary: 212 mg/dL — ABNORMAL HIGH (ref 65–99)
Glucose-Capillary: 280 mg/dL — ABNORMAL HIGH (ref 65–99)
Glucose-Capillary: 295 mg/dL — ABNORMAL HIGH (ref 65–99)

## 2017-06-19 MED ORDER — FUROSEMIDE 10 MG/ML IJ SOLN
40.0000 mg | Freq: Once | INTRAMUSCULAR | Status: AC
  Administered 2017-06-19: 40 mg via INTRAVENOUS
  Filled 2017-06-19: qty 4

## 2017-06-19 MED ORDER — LORAZEPAM 2 MG/ML IJ SOLN
1.0000 mg | INTRAMUSCULAR | Status: DC | PRN
  Administered 2017-06-19 – 2017-06-30 (×18): 1 mg via INTRAVENOUS
  Filled 2017-06-19 (×20): qty 1

## 2017-06-19 MED ORDER — HALOPERIDOL LACTATE 5 MG/ML IJ SOLN
2.0000 mg | Freq: Once | INTRAMUSCULAR | Status: DC
  Filled 2017-06-19: qty 1

## 2017-06-19 NOTE — Progress Notes (Signed)
PROGRESS NOTE    Vincent Hansen  XQJ:[id number] DOB: 1945/05/17 DOA: 05/30/2017 PCP: Patient, No Pcp Per   Brief Narrative:  72 year old with past medical history relevant for hypertension, COPD, type 2 diabetes, prior history of squamous cell carcinoma of the larynx admitted with hypoxia and found to have large laryngeal mass obstructing airway status post emergency tracheostomy placement with 6 Shiley tracheostomy on 05/30/2017 status post extubation on 05/31/2017. Due to continued aspiration risk, he is kept NPO and PEG was placed for nutrition. Total laryngectomy 4/3 by ENT.  Assessment & Plan   Acute respiratory failure with hypoxia secondary to squamous cell carcinoma of the larynx/severe oropharyngeal dysphasia -#6 XLT trach s/p tracheostomy -on 05/30/2017 -Patient had PEG tube placed on 06/09/2017 with initiation of feeding -ENT consulted, status post total laryngectomy on 06/16/2017 -Oncology consulted and appreciated, to prescribe adjuvant systemic chemotherapy and radiation -Speech therapy will continue to follow -Patient does have copious secretions -Tracheal aspirate showed abundant Pseudomonas aeruginosa  -Plan is to possibly start patient on liquids on 06/23/2017 -Overnight patient had episode of SOB, may have had a plug vs volume overload -will discontinue IVF and give dose of IV lasix  Chronic periodontitis with bone loss  -Patient evaluated by dental medicine on 06/09/2017 and deferred dental treatment pending results of laryngectomy  Hiatal hernia -CT the abdomen showed concern for possible gastric adenocarcinoma versus hiatal hernia -Patient had EGD on 06/07/2017 which showed no evidence of gastric adenocarcinoma but did show medium sized hiatal hernia -Continue PPI  Essential hypertension -Continue labetalol as needed, BP currently controlled  Diabetes mellitus, type II -Continue Lantus, insulin sliding scale, CBG monitoring  COPD -Currently without exacerbation, no  wheezing -Continue around on Pulmicort, pulmonary toilet postoperatively  Malnutrition of moderate degree -Patient has PEG tube in place and is currently on tube feeds -Plan is for n.p.o. for 5 days at which point ENT will reevaluate patient to see if he can be on clear liquids  Mild oliguria -Continue foley catheter to monitor output -Renal ultrasound obtained showing no hydronephrosis or obstruction.  Enlarged prostate gland noted.  Multiple bilateral renal cysts (follow-up ultrasound in 12 months recommended) -creatinine currently stable -given SOB overnight, discontinued IVF, given dose of lasix  DVT Prophylaxis  SCDs  Code Status: Full  Family Communication: None at bedside  Disposition Plan: Admitted. Dispo to SNF, however not for several weeks. Transfer to medical floor  Consultants ENT Interventional radiology Gastroenterology Oncology Dental surgery  Procedures  Emergent trach and laryngoscopy on 05/30/2017 by Dr. Constance Holster EGD on 06/07/2017 Total laryngectomy on 06/16/2017  Antibiotics   Anti-infectives (From admission, onward)   Start     Dose/Rate Route Frequency Ordered Stop   06/16/17 1800  clindamycin (CLEOCIN) IVPB 600 mg     600 mg 100 mL/hr over 30 Minutes Intravenous Every 8 hours 06/16/17 1658 06/18/17 1425   06/16/17 0000  clindamycin (CLEOCIN) IVPB 900 mg     900 mg 100 mL/hr over 30 Minutes Intravenous On call to O.R. 06/15/17 1716 06/16/17 1307   06/09/17 1500  ceFAZolin (ANCEF) IVPB 2g/100 mL premix     2 g 200 mL/hr over 30 Minutes Intravenous To Radiology 06/08/17 1427 06/09/17 1410      Subjective:   Vincent Hansen seen and examined today.  Uses written communication. Complains of pain and wants medications. Denies current chest pain. Feels some shortness of breath. Denies abdominal pain, nausea, dizziness, headache. Frustrated that he cannot speak or eat.   Objective:   Vitals:  06/19/17 0805 06/19/17 0806 06/19/17 0812 06/19/17 0900  BP:    (!) 153/85 (!) 146/74  Pulse:   92 95  Resp:   20 16  Temp:      TempSrc:      SpO2: 100% 100% 100% 93%  Weight:      Height:        Intake/Output Summary (Last 24 hours) at 06/19/2017 1012 Last data filed at 06/19/2017 0900 Gross per 24 hour  Intake 4681.25 ml  Output 2728 ml  Net 1953.25 ml   Filed Weights   06/17/17 0437 06/18/17 0500 06/19/17 0500  Weight: 78.7 kg (173 lb 8 oz) 81.9 kg (180 lb 8.9 oz) 81.8 kg (180 lb 5.4 oz)   Exam  General: Well developed, chronically ill appearing, NAD  HEENT: NCAT, mucous membranes moist. Poor dentition  Neck: Supple, Trach  Cardiovascular: S1 S2 auscultated, no rubs, murmurs or gallops. Regular rate and rhythm.  Respiratory: Clear to auscultation bilaterally with equal chest rise- no wheezing or rhonchi  Abdomen: Soft, nontender, nondistended, + bowel sounds, +peg  Extremities: warm dry without cyanosis clubbing or edema  Neuro: AAOx3, nonfocal  Psych: Agitated   Data Reviewed: I have personally reviewed following labs and imaging studies  CBC: Recent Labs  Lab 06/14/17 0516 06/15/17 0519 06/16/17 0420 06/18/17 0500 06/19/17 0355  WBC 5.1 5.6 4.4 6.2 7.8  HGB 11.2* 11.1* 10.9* 9.6* 9.2*  HCT 35.9* 35.3* 35.2* 30.5* 29.6*  MCV 86.1 85.9 86.9 86.9 86.5  PLT 244 270 239 210 845   Basic Metabolic Panel: Recent Labs  Lab 06/13/17 0440 06/14/17 0516 06/15/17 0519 06/16/17 0420 06/18/17 0500 06/19/17 0355  NA 143 143 144 141 138 139  K 3.8 3.6 3.7 3.9 4.0 4.7  CL 102 100* 101 96* 98* 99*  CO2 32 35* 35* 34* 32 31  GLUCOSE 211* 191* 185* 248* 178* 198*  BUN '12 11 10 11 20 ' 21*  CREATININE 0.98 1.01 0.92 1.16 1.23 1.19  CALCIUM 9.0 8.8* 9.0 8.9 8.2* 8.2*  MG 1.6* 1.7 1.5* 1.9  --   --   PHOS 2.2* 2.7 2.8 3.2  --   --    GFR: Estimated Creatinine Clearance: 62.5 mL/min (by C-G formula based on SCr of 1.19 mg/dL). Liver Function Tests: No results for input(s): AST, ALT, ALKPHOS, BILITOT, PROT, ALBUMIN in the  last 168 hours. No results for input(s): LIPASE, AMYLASE in the last 168 hours. No results for input(s): AMMONIA in the last 168 hours. Coagulation Profile: No results for input(s): INR, PROTIME in the last 168 hours. Cardiac Enzymes: No results for input(s): CKTOTAL, CKMB, CKMBINDEX, TROPONINI in the last 168 hours. BNP (last 3 results) No results for input(s): PROBNP in the last 8760 hours. HbA1C: No results for input(s): HGBA1C in the last 72 hours. CBG: Recent Labs  Lab 06/18/17 0735 06/18/17 1121 06/18/17 1546 06/18/17 2029 06/19/17 0012  GLUCAP 167* 179* 146* 297* 212*   Lipid Profile: No results for input(s): CHOL, HDL, LDLCALC, TRIG, CHOLHDL, LDLDIRECT in the last 72 hours. Thyroid Function Tests: No results for input(s): TSH, T4TOTAL, FREET4, T3FREE, THYROIDAB in the last 72 hours. Anemia Panel: No results for input(s): VITAMINB12, FOLATE, FERRITIN, TIBC, IRON, RETICCTPCT in the last 72 hours. Urine analysis:    Component Value Date/Time   COLORURINE YELLOW 05/30/2017 1330   APPEARANCEUR CLEAR 05/30/2017 1330   LABSPEC 1.020 05/30/2017 1330   PHURINE 5.0 05/30/2017 1330   GLUCOSEU >=500 (A) 05/30/2017 1330   HGBUR  MODERATE (A) 05/30/2017 1330   BILIRUBINUR NEGATIVE 05/30/2017 1330   KETONESUR 5 (A) 05/30/2017 1330   PROTEINUR >=300 (A) 05/30/2017 1330   NITRITE NEGATIVE 05/30/2017 1330   LEUKOCYTESUR TRACE (A) 05/30/2017 1330   Sepsis Labs: '@[email or web address]' (procalcitonin:4,lacticidven:4)  ) Recent Results (from the past 240 hour(s))  Culture, respiratory (NON-Expectorated)     Status: None   Collection Time: 06/14/17  3:30 PM  Result Value Ref Range Status   Specimen Description   Final    TRACHEAL ASPIRATE Performed at [facility] [address] [place], [place] [address]    Special Requests   Final    NONE Performed at [facility], [place] [address], [place], [place] [address]    Gram Stain   Final    ABUNDANT WBC  PRESENT,BOTH PMN AND MONONUCLEAR MODERATE GRAM VARIABLE ROD FEW GRAM POSITIVE COCCI Performed at [facility], [place] [address], [place], [place] [address]    Culture ABUNDANT PSEUDOMONAS AERUGINOSA  Final   Report Status 06/17/2017 FINAL  Final   Organism ID, Bacteria PSEUDOMONAS AERUGINOSA  Final      Susceptibility   Pseudomonas aeruginosa - MIC*    CEFTAZIDIME 4 SENSITIVE Sensitive     CIPROFLOXACIN <=0.25 SENSITIVE Sensitive     GENTAMICIN 2 SENSITIVE Sensitive     IMIPENEM 1 SENSITIVE Sensitive     PIP/TAZO 8 SENSITIVE Sensitive     CEFEPIME 2 SENSITIVE Sensitive     * ABUNDANT PSEUDOMONAS AERUGINOSA  Surgical pcr screen     Status: None   Collection Time: 06/15/17  5:15 PM  Result Value Ref Range Status   MRSA, PCR NEGATIVE NEGATIVE Final   Staphylococcus aureus NEGATIVE NEGATIVE Final    Comment: (NOTE) The Xpert SA Assay (FDA approved for NASAL specimens in patients 29 years of age and older), is one component of a comprehensive surveillance program. It is not intended to diagnose infection nor to guide or monitor treatment. Performed at [facility], [place] [address], [place], [place] [address]       Radiology Studies: US Renal  Result Date: 06/18/2017 CLINICAL DATA:  Low urine output. EXAM: RENAL / URINARY TRACT ULTRASOUND COMPLETE COMPARISON:  None. FINDINGS: Right Kidney: Length: 11.7 cm. Echogenicity within normal limits. No hydronephrosis visualized. 4.7 cm simple cyst is seen in upper pole. 2.2 cm septated cyst is seen. Left Kidney: Length: 12.4 cm. Multiple cysts are noted with the largest measuring 3.2 cm. Echogenicity within normal limits. No mass or hydronephrosis visualized. Bladder: Bladder is nondistended due to Foley catheter. Enlarged prostate gland is noted. IMPRESSION: Multiple bilateral renal cysts are noted. 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 No hydronephrosis or renal obstruction is noted. Foley catheter is noted.  Enlarged prostate gland is noted. Electronically Signed   By: Marijo Conception, M.D.   On: 06/18/2017 09:43   Dg Chest Port 1 View  Result Date: 06/19/2017 CLINICAL DATA:  Hypoxia tonight.  Tracheostomy complication. EXAM: PORTABLE CHEST 1 VIEW COMPARISON:  06/09/2017 FINDINGS: Tracheostomy tube is present. Shallow inspiration with atelectasis in the lung bases. Emphysematous changes in the lungs. Slight interstitial pattern to the lung bases may indicate early edema. Probable small bilateral pleural effusions. These changes are progressing since previous study. Heart size and pulmonary vascularity are normal for technique. No pneumothorax. Calcification of the aorta. Skin clips along the base of  the neck. IMPRESSION: Developing interstitial pattern to the lung bases may indicate early edema. Small bilateral pleural effusions also appear to be developing. Emphysematous changes in the lungs. Aortic atherosclerosis. Electronically Signed   By: Lucienne Capers M.D.   On: 06/19/2017 02:17     Scheduled Meds: . arformoterol  15 mcg Nebulization BID  . bacitracin  1 application Topical E5B  . budesonide (PULMICORT) nebulizer solution  0.5 mg Nebulization BID  . chlorhexidine gluconate (MEDLINE KIT)  15 mL Mouth Rinse BID  . docusate  100 mg Per Tube BID  . feeding supplement (JEVITY 1.5 CAL/FIBER)  355 mL Per Tube QID  . feeding supplement (PRO-STAT SUGAR FREE 64)  30 mL Per Tube Daily  . free water  100 mL Per Tube TID  . free water  120 mL Per Tube QID  . insulin aspart  0-15 Units Subcutaneous Q4H  . insulin glargine  10 Units Subcutaneous QHS  . ipratropium-albuterol  3 mL Nebulization Q6H  . mouth rinse  15 mL Mouth Rinse QID  . morphine CONCENTRATE  10 mg Per Tube Q8H  . pantoprazole sodium  40 mg Per Tube Daily  . sennosides  5 mL Per Tube QHS   Continuous Infusions: . lactated ringers 10 mL/hr at  06/19/17 0900     LOS: 20 days   Time Spent in minutes   30 minutes  Jilliann Subramanian D.O. on 06/19/2017 at 10:12 AM  Between 7am to 7pm - Pager - [phone]  After 7pm go to [email or web address] - password [email or web address]  And look for the night coverage person covering for me after hours  [facility] Office  [phone]

## 2017-06-19 NOTE — Progress Notes (Signed)
Otolaryngology Progress Note  Patient ID: Vincent Hansen, male   DOB: 01-09-1946, 72 y.o.   MRN: [id number] Subjective: Did reasonably well overnight. Sounds like he may have had a plug of the trach tube, which is currently being used in his laryngeal stoma while a laryngectomy tube is being ordered.  Writing to communicate TF via G tube Has reportedly ambulated  asking for pain medication  Objective: Vital signs in last 24 hours: Temp:  [97.8 F (36.6 C)-99.1 F (37.3 C)] 98.1 F (36.7 C) (04/06 0800) Pulse Rate:  [59-135] 95 (04/06 0900) Resp:  [8-34] 16 (04/06 0900) BP: (104-163)/(54-112) 146/74 (04/06 0900) SpO2:  [89 %-100 %] 93 % (04/06 0900) FiO2 (%):  [28 %-70 %] 35 % (04/06 0812) Weight:  [81.8 kg (180 lb 5.4 oz)] 81.8 kg (180 lb 5.4 oz) (04/06 0500) Weight change: -0.1 kg (-3.5 oz) Last BM Date: 06/13/17  Intake/Output from previous day: 04/05 0701 - 04/06 0700 In: 4470 [I.V.:2875; NG/GT:1590] Out: 753 [Urine:730; Drains:23] Intake/Output this shift: Total I/O In: 706.3 [I.V.:231.3; NG/GT:475] Out: 2125 [Urine:2125]  PHYSICAL EXAM: Neck flat look excellent.  Minimal drainage in the JP bulbs- removed RIGHT JP.   Neck soft, stoma appears healthy.  Patient somewhat combative this morning  Lab Results: Recent Labs    06/18/17 0500 06/19/17 0355  WBC 6.2 7.8  HGB 9.6* 9.2*  HCT 30.5* 29.6*  PLT 210 208   BMET Recent Labs    06/18/17 0500 06/19/17 0355  NA 138 139  K 4.0 4.7  CL 98* 99*  CO2 32 31  GLUCOSE 178* 198*  BUN 20 21*  CREATININE 1.23 1.19  CALCIUM 8.2* 8.2*    Studies/Results: US Renal  Result Date: 06/18/2017 CLINICAL DATA:  Low urine output. EXAM: RENAL / URINARY TRACT ULTRASOUND COMPLETE COMPARISON:  None. FINDINGS: Right Kidney: Length: 11.7 cm. Echogenicity within normal limits. No hydronephrosis visualized. 4.7 cm simple cyst is seen in upper pole. 2.2 cm septated cyst is seen. Left Kidney: Length: 12.4 cm. Multiple cysts are noted  with the largest measuring 3.2 cm. Echogenicity within normal limits. No mass or hydronephrosis visualized. Bladder: Bladder is nondistended due to Foley catheter. Enlarged prostate gland is noted. IMPRESSION: Multiple bilateral renal cysts are noted. 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 No hydronephrosis or renal obstruction is noted. Foley catheter is noted.  Enlarged prostate gland is noted. Electronically Signed   By: Marijo Conception, M.D.   On: 06/18/2017 09:43   Dg Chest Port 1 View  Result Date: 06/19/2017 CLINICAL DATA:  Hypoxia tonight.  Tracheostomy complication. EXAM: PORTABLE CHEST 1 VIEW COMPARISON:  06/09/2017 FINDINGS: Tracheostomy tube is present. Shallow inspiration with atelectasis in the lung bases. Emphysematous changes in the lungs. Slight interstitial pattern to the lung bases may indicate early edema. Probable small bilateral pleural effusions. These changes are progressing since previous study. Heart size and pulmonary vascularity are normal for technique. No pneumothorax. Calcification of the aorta. Skin clips along the base of the neck. IMPRESSION: Developing interstitial pattern to the lung bases may indicate early edema. Small bilateral pleural effusions also appear to be developing. Emphysematous changes in the lungs. Aortic atherosclerosis. Electronically Signed   By: Lucienne Capers M.D.   On: 06/19/2017 02:17    Medications: I have reviewed the patient's current medications.  Assessment/Plan:  Vincent Hansen is a 72 y.o. male who is POD #3  from total laryngectomy.   We will start him on oral liquids on Wednesday, 4/10. I will see if I can order a laryngectomy tube which would be more comfortable and easier to care for. Continue tube feeds via G tube Continue PPI Continue humidification via trach tube- this ma OK from ENT standpoint for transfer to  floor with Medicine involvement for medical issues to please continue. Thank you for your input.     LOS: 20 days   Helayne Seminole, MD 06/19/2017, 10:05 AM

## 2017-06-19 NOTE — Progress Notes (Signed)
Pt remains on 35% ATC at this time tolerating well.  #6 Bivona secured at 8, cuff is deflated.  RT will monitor

## 2017-06-19 NOTE — Progress Notes (Signed)
This note also relates to the following rows which could not be included: BP - Cannot attach notes to unvalidated device data  Patient having difficulty breathing, upon arrival patient very distressed and anxious. Suction patient out through trach for a copious amount of blood tinged thick secretions with old blood clots and plugs. Patient had to be bagged with 100% and then suctioned through a ballard and lavaged. Removing more clots. Patient O2 was increased to 70% for the time and is slowly being turned back down. Patient is feeling much better and resting more comfortably. X Ray pending

## 2017-06-20 DIAGNOSIS — R34 Anuria and oliguria: Secondary | ICD-10-CM

## 2017-06-20 DIAGNOSIS — R443 Hallucinations, unspecified: Secondary | ICD-10-CM

## 2017-06-20 LAB — GLUCOSE, CAPILLARY
GLUCOSE-CAPILLARY: 87 mg/dL (ref 65–99)
GLUCOSE-CAPILLARY: 228 mg/dL — AB (ref 65–99)
Glucose-Capillary: 190 mg/dL — ABNORMAL HIGH (ref 65–99)
Glucose-Capillary: 264 mg/dL — ABNORMAL HIGH (ref 65–99)
Glucose-Capillary: 246 mg/dL — ABNORMAL HIGH (ref 65–99)
Glucose-Capillary: 261 mg/dL — ABNORMAL HIGH (ref 65–99)

## 2017-06-20 LAB — BASIC METABOLIC PANEL
Anion gap: 10 (ref 5–15)
BUN: 13 mg/dL (ref 6–20)
CALCIUM: 8.7 mg/dL — AB (ref 8.9–10.3)
CHLORIDE: 96 mmol/L — AB (ref 101–111)
CO2: 33 mmol/L — AB (ref 22–32)
CREATININE: 1.04 mg/dL (ref 0.61–1.24)
GFR calc non Af Amer: >60 mL/min (ref >60)
Glucose, Bld: 230 mg/dL — ABNORMAL HIGH (ref 65–99)
Potassium: 4.4 mmol/L (ref 3.5–5.1)
Sodium: 139 mmol/L (ref 135–145)

## 2017-06-20 MED ORDER — IPRATROPIUM-ALBUTEROL 0.5-2.5 (3) MG/3ML IN SOLN
3.0000 mL | Freq: Three times a day (TID) | RESPIRATORY_TRACT | Status: DC
  Administered 2017-06-21 – 2017-06-23 (×7): 3 mL via RESPIRATORY_TRACT
  Filled 2017-06-20 (×8): qty 3

## 2017-06-20 NOTE — Progress Notes (Signed)
PROGRESS NOTE    Vincent Hansen  JGO:[id number] DOB: 1945/03/28 DOA: 05/30/2017 PCP: Patient, No Pcp Per   Brief Narrative:  72 year old with past medical history relevant for hypertension, COPD, type 2 diabetes, prior history of squamous cell carcinoma of the larynx admitted with hypoxia and found to have large laryngeal mass obstructing airway status post emergency tracheostomy placement with 6 Shiley tracheostomy on 05/30/2017 status post extubation on 05/31/2017. Due to continued aspiration risk, he is kept NPO and PEG was placed for nutrition. Total laryngectomy 4/3 by ENT.  Assessment & Plan   Acute respiratory failure with hypoxia secondary to squamous cell carcinoma of the larynx/severe oropharyngeal dysphasia -#6 XLT trach s/p tracheostomy -on 05/30/2017 -Patient had PEG tube placed on 06/09/2017 with initiation of feeding -ENT consulted, status post total laryngectomy on 06/16/2017 -Oncology consulted and appreciated, to prescribe adjuvant systemic chemotherapy and radiation -Speech therapy will continue to follow -Patient does have copious secretions -Tracheal aspirate showed abundant Pseudomonas aeruginosa  -Plan is to possibly start patient on liquids on 06/23/2017 -Overnight patient had episode of SOB, may have had a plug vs volume overload -will discontinue IVF and give dose of IV lasix  Agitation -possibly secondary to pain  -patient feels he cannot breath, however SpO2 100% -discussed with RN, may need suctioning or repositioning -Continue ativan 16m PRN q1hr  Chronic periodontitis with bone loss  -Patient evaluated by dental medicine on 06/09/2017 and deferred dental treatment pending results of laryngectomy  Hiatal hernia -CT the abdomen showed concern for possible gastric adenocarcinoma versus hiatal hernia -Patient had EGD on 06/07/2017 which showed no evidence of gastric adenocarcinoma but did show medium sized hiatal hernia -Continue PPI  Essential  hypertension -Continue labetalol as needed, BP currently controlled  Diabetes mellitus, type II -Continue Lantus, insulin sliding scale, CBG monitoring  COPD -Currently without exacerbation, no wheezing -Continue around on Pulmicort, pulmonary toilet postoperatively  Malnutrition of moderate degree -Patient has PEG tube in place and is currently on tube feeds -Plan is for n.p.o. for 5 days at which point ENT will reevaluate patient to see if he can be on clear liquids  Mild oliguria -Continue foley catheter to monitor output -Renal ultrasound obtained showing no hydronephrosis or obstruction.  Enlarged prostate gland noted.  Multiple bilateral renal cysts (follow-up ultrasound in 12 months recommended) -creatinine currently stable -given SOB overnight, discontinued IVF, given dose of lasix on 06/19/2017 -Urine output 5110cc over past 24 hours  DVT Prophylaxis  SCDs  Code Status: Full  Family Communication: None at bedside  Disposition Plan: Admitted. Dispo to SNF, however not for several weeks. Will place in stepdown given agitation and current ativan dosing   Consultants ENT Interventional radiology Gastroenterology Oncology Dental surgery  Procedures  Emergent trach and laryngoscopy on 05/30/2017 by Dr. RConstance HolsterEGD on 06/07/2017 Total laryngectomy on 06/16/2017  Antibiotics   Anti-infectives (From admission, onward)   Start     Dose/Rate Route Frequency Ordered Stop   06/16/17 1800  clindamycin (CLEOCIN) IVPB 600 mg     600 mg 100 mL/hr over 30 Minutes Intravenous Every 8 hours 06/16/17 1658 06/18/17 1425   06/16/17 0000  clindamycin (CLEOCIN) IVPB 900 mg     900 mg 100 mL/hr over 30 Minutes Intravenous On call to O.R. 06/15/17 1716 06/16/17 1307   06/09/17 1500  ceFAZolin (ANCEF) IVPB 2g/100 mL premix     2 g 200 mL/hr over 30 Minutes Intravenous To Radiology 06/08/17 1427 06/09/17 1410      Subjective:  Vincent Hansen seen and examined today.  He feels he  cannot breathe and is short of breath.  Very agitated.  Denies any current chest pain, abdominal pain.  Upset that he cannot eat.  Objective:   Vitals:   06/20/17 0630 06/20/17 0700 06/20/17 0800 06/20/17 0816  BP: 138/86 128/77 138/79   Pulse: 93 95 93   Resp: 20 (!) 27 (!) 23   Temp:    98.2 F (36.8 C)  TempSrc:    Oral  SpO2: 99% 92% 95%   Weight:      Height:        Intake/Output Summary (Last 24 hours) at 06/20/2017 1040 Last data filed at 06/20/2017 0800 Gross per 24 hour  Intake 1230 ml  Output 2985 ml  Net -1755 ml   Filed Weights   06/18/17 0500 06/19/17 0500 06/20/17 0400  Weight: 81.9 kg (180 lb 8.9 oz) 81.8 kg (180 lb 5.4 oz) 79 kg (174 lb 2.6 oz)   Exam  General: Well developed, chronically ill appearing, NAD  HEENT: NCAT, mucous membranes moist.   Neck: +trach  Cardiovascular: S1 S2 auscultated, RRR, no murmur  Respiratory: Clear to auscultation bilaterally  Abdomen: Soft, nontender, nondistended, + bowel sounds, +peg tube  Extremities: warm dry without cyanosis clubbing or edema  Neuro: AAOx3, nonfocal  Psych: Agitated, anxious   Data Reviewed: I have personally reviewed following labs and imaging studies  CBC: Recent Labs  Lab 06/14/17 0516 06/15/17 0519 06/16/17 0420 06/18/17 0500 06/19/17 0355  WBC 5.1 5.6 4.4 6.2 7.8  HGB 11.2* 11.1* 10.9* 9.6* 9.2*  HCT 35.9* 35.3* 35.2* 30.5* 29.6*  MCV 86.1 85.9 86.9 86.9 86.5  PLT 244 270 239 210 297   Basic Metabolic Panel: Recent Labs  Lab 06/14/17 0516 06/15/17 0519 06/16/17 0420 06/18/17 0500 06/19/17 0355 06/20/17 0305  NA 143 144 141 138 139 139  K 3.6 3.7 3.9 4.0 4.7 4.4  CL 100* 101 96* 98* 99* 96*  CO2 35* 35* 34* 32 31 33*  GLUCOSE 191* 185* 248* 178* 198* 230*  BUN '11 10 11 20 ' 21* 13  CREATININE 1.01 0.92 1.16 1.23 1.19 1.04  CALCIUM 8.8* 9.0 8.9 8.2* 8.2* 8.7*  MG 1.7 1.5* 1.9  --   --   --   PHOS 2.7 2.8 3.2  --   --   --    GFR: Estimated Creatinine Clearance: 71.5  mL/min (by C-G formula based on SCr of 1.04 mg/dL). Liver Function Tests: No results for input(s): AST, ALT, ALKPHOS, BILITOT, PROT, ALBUMIN in the last 168 hours. No results for input(s): LIPASE, AMYLASE in the last 168 hours. No results for input(s): AMMONIA in the last 168 hours. Coagulation Profile: No results for input(s): INR, PROTIME in the last 168 hours. Cardiac Enzymes: No results for input(s): CKTOTAL, CKMB, CKMBINDEX, TROPONINI in the last 168 hours. BNP (last 3 results) No results for input(s): PROBNP in the last 8760 hours. HbA1C: No results for input(s): HGBA1C in the last 72 hours. CBG: Recent Labs  Lab 06/19/17 1541 06/19/17 2035 06/19/17 2341 06/20/17 0410 06/20/17 0814  GLUCAP 185* 205* 295* 190* 87   Lipid Profile: No results for input(s): CHOL, HDL, LDLCALC, TRIG, CHOLHDL, LDLDIRECT in the last 72 hours. Thyroid Function Tests: No results for input(s): TSH, T4TOTAL, FREET4, T3FREE, THYROIDAB in the last 72 hours. Anemia Panel: No results for input(s): VITAMINB12, FOLATE, FERRITIN, TIBC, IRON, RETICCTPCT in the last 72 hours. Urine analysis:    Component Value Date/Time  COLORURINE YELLOW 05/30/2017 1330   APPEARANCEUR CLEAR 05/30/2017 1330   LABSPEC 1.020 05/30/2017 1330   PHURINE 5.0 05/30/2017 1330   GLUCOSEU >=500 (A) 05/30/2017 1330   HGBUR MODERATE (A) 05/30/2017 1330   BILIRUBINUR NEGATIVE 05/30/2017 1330   KETONESUR 5 (A) 05/30/2017 1330   PROTEINUR >=300 (A) 05/30/2017 1330   NITRITE NEGATIVE 05/30/2017 1330   LEUKOCYTESUR TRACE (A) 05/30/2017 1330   Sepsis Labs: '@LABRCNTIP' (procalcitonin:4,lacticidven:4)  ) Recent Results (from the past 240 hour(s))  Culture, respiratory (NON-Expectorated)     Status: None   Collection Time: 06/14/17  3:30 PM  Result Value Ref Range Status   Specimen Description   Final    TRACHEAL ASPIRATE Performed at [facility], [place] [address], [place], [place] [address]    Special Requests    Final    NONE Performed at [facility], [place] [address] [place], [place] [address]    Gram Stain   Final    ABUNDANT WBC PRESENT,BOTH PMN AND MONONUCLEAR MODERATE GRAM VARIABLE ROD FEW GRAM POSITIVE COCCI Performed at [facility], [place] [address], [place], [place] [address]    Culture ABUNDANT PSEUDOMONAS AERUGINOSA  Final   Report Status 06/17/2017 FINAL  Final   Organism ID, Bacteria PSEUDOMONAS AERUGINOSA  Final      Susceptibility   Pseudomonas aeruginosa - MIC*    CEFTAZIDIME 4 SENSITIVE Sensitive     CIPROFLOXACIN <=0.25 SENSITIVE Sensitive     GENTAMICIN 2 SENSITIVE Sensitive     IMIPENEM 1 SENSITIVE Sensitive     PIP/TAZO 8 SENSITIVE Sensitive     CEFEPIME 2 SENSITIVE Sensitive     * ABUNDANT PSEUDOMONAS AERUGINOSA  Surgical pcr screen     Status: None   Collection Time: 06/15/17  5:15 PM  Result Value Ref Range Status   MRSA, PCR NEGATIVE NEGATIVE Final   Staphylococcus aureus NEGATIVE NEGATIVE Final    Comment: (NOTE) The Xpert SA Assay (FDA approved for NASAL specimens in patients 39 years of age and older), is one component of a comprehensive surveillance program. It is not intended to diagnose infection nor to guide or monitor treatment. Performed at [facility], [place] [address], [place], [place] [address]       Radiology Studies: Dg Chest Port 1 View  Result Date: 06/19/2017 CLINICAL DATA:  Hypoxia tonight.  Tracheostomy complication. EXAM: PORTABLE CHEST 1 VIEW COMPARISON:  06/09/2017 FINDINGS: Tracheostomy tube is present. Shallow inspiration with atelectasis in the lung bases. Emphysematous changes in the lungs. Slight interstitial pattern to the lung bases may indicate early edema. Probable small bilateral pleural effusions. These changes are progressing since previous study. Heart size and pulmonary vascularity are normal for technique. No pneumothorax. Calcification of the aorta. Skin clips along the base of the neck.  IMPRESSION: Developing interstitial pattern to the lung bases may indicate early edema. Small bilateral pleural effusions also appear to be developing. Emphysematous changes in the lungs. Aortic atherosclerosis. Electronically Signed   By: Lucienne Capers M.D.   On: 06/19/2017 02:17     Scheduled Meds: . arformoterol  15 mcg Nebulization BID  . bacitracin  1 application Topical Q7R  . budesonide (PULMICORT) nebulizer solution  0.5 mg Nebulization BID  . chlorhexidine gluconate (MEDLINE KIT)  15 mL Mouth Rinse BID  . docusate  100 mg Per Tube BID  . feeding supplement (JEVITY 1.5 CAL/FIBER)  355 mL Per Tube QID  . feeding supplement (PRO-STAT SUGAR FREE 64)  30 mL  Per Tube Daily  . free water  100 mL Per Tube TID  . free water  120 mL Per Tube QID  . haloperidol lactate  2 mg Intravenous Once  . insulin aspart  0-15 Units Subcutaneous Q4H  . insulin glargine  10 Units Subcutaneous QHS  . ipratropium-albuterol  3 mL Nebulization Q6H  . mouth rinse  15 mL Mouth Rinse QID  . morphine CONCENTRATE  10 mg Per Tube Q8H  . pantoprazole sodium  40 mg Per Tube Daily  . sennosides  5 mL Per Tube QHS   Continuous Infusions: . lactated ringers 10 mL/hr at 06/20/17 0800     LOS: 21 days   Time Spent in minutes   45 minutes (spent face to face with the patient and RN examining patient and formulating plan)  Cristal Ford D.O. on 06/20/2017 at 10:40 AM  Between 7am to 7pm - Pager - [phone]  After 7pm go to [email or web address] - password [email or web address]  And look for the night coverage person covering for me after hours  [facility] Office  [phone]

## 2017-06-20 NOTE — Plan of Care (Signed)
Pt tolerating TC and is coming down on supplemental oxygen requirements

## 2017-06-20 NOTE — Progress Notes (Signed)
Otolaryngology Progress Note  Patient ID: Vincent Hansen, male   DOB: 1945-12-12, 72 y.o.   MRN: [id number] Subjective: Did reasonably well overnight. Breathing well. On Ativan for some agitation, waiting on bed on the floor to come available.   TF via G tube Has reportedly ambulated  Objective: Vital signs in last 24 hours: Temp:  [98.2 F (36.8 C)-100.1 F (37.8 C)] 98.2 F (36.8 C) (04/07 0816) Pulse Rate:  [78-120] 93 (04/07 0800) Resp:  [10-29] 23 (04/07 0800) BP: (105-160)/(60-115) 138/79 (04/07 0800) SpO2:  [88 %-100 %] 95 % (04/07 0800) FiO2 (%):  [30 %-35 %] 35 % (04/07 0851) Weight:  [79 kg (174 lb 2.6 oz)] 79 kg (174 lb 2.6 oz) (04/07 0400) Weight change: -2.8 kg (-6 lb 2.8 oz) Last BM Date: 06/13/17  Intake/Output from previous day: 04/06 0701 - 04/07 0700 In: 1936.3 [I.V.:451.3; NG/GT:1485] Out: 3704 [Urine:5110] Intake/Output this shift: Total I/O In: 10 [I.V.:10] Out: -   PHYSICAL EXAM: Neck flat look excellent.  Minimal drainage in the JP bulb in left neck, removed today.  Neck soft, stoma appears healthy. Removed trach tube in stoma and replaced.  Incisions c/d/i Patient somewhat cooperative. Not very interactive, but appropriate when he elects to respond.   Lab Results: Recent Labs    06/18/17 0500 06/19/17 0355  WBC 6.2 7.8  HGB 9.6* 9.2*  HCT 30.5* 29.6*  PLT 210 208   BMET Recent Labs    06/19/17 0355 06/20/17 0305  NA 139 139  K 4.7 4.4  CL 99* 96*  CO2 31 33*  GLUCOSE 198* 230*  BUN 21* 13  CREATININE 1.19 1.04  CALCIUM 8.2* 8.7*    Studies/Results: Dg Chest Port 1 View  Result Date: 06/19/2017 CLINICAL DATA:  Hypoxia tonight.  Tracheostomy complication. EXAM: PORTABLE CHEST 1 VIEW COMPARISON:  06/09/2017 FINDINGS: Tracheostomy tube is present. Shallow inspiration with atelectasis in the lung bases. Emphysematous changes in the lungs. Slight interstitial pattern to the lung bases may indicate early edema. Probable small bilateral  pleural effusions. These changes are progressing since previous study. Heart size and pulmonary vascularity are normal for technique. No pneumothorax. Calcification of the aorta. Skin clips along the base of the neck. IMPRESSION: Developing interstitial pattern to the lung bases may indicate early edema. Small bilateral pleural effusions also appear to be developing. Emphysematous changes in the lungs. Aortic atherosclerosis. Electronically Signed   By: Lucienne Capers M.D.   On: 06/19/2017 02:17    Medications: I have reviewed the patient's current medications.  Assessment/Plan:  Vincent Hansen is a 72 y.o. male who is POD #4 from total laryngectomy.   We will plan to start him on oral liquids on Wednesday, 4/10.  laryngectomy tube ordered per unit secretary.  Continue tube feeds via G tube Continue PPI Continue humidification via trach tube OK from ENT standpoint for transfer to floor with Medicine involvement for medical issues to please continue. Thank you for your input.     LOS: 21 days   Helayne Seminole, MD 06/20/2017, 9:26 AM

## 2017-06-21 LAB — BASIC METABOLIC PANEL
Anion gap: 10 (ref 5–15)
BUN: 10 mg/dL (ref 6–20)
CO2: 30 mmol/L (ref 22–32)
Calcium: 8.7 mg/dL — ABNORMAL LOW (ref 8.9–10.3)
Chloride: 95 mmol/L — ABNORMAL LOW (ref 101–111)
Creatinine, Ser: 0.99 mg/dL (ref 0.61–1.24)
GFR calc Af Amer: >60 mL/min (ref >60)
GFR calc non Af Amer: >60 mL/min (ref >60)
GLUCOSE: 247 mg/dL — AB (ref 65–99)
POTASSIUM: 4.9 mmol/L (ref 3.5–5.1)
Sodium: 135 mmol/L (ref 135–145)

## 2017-06-21 LAB — GLUCOSE, CAPILLARY
GLUCOSE-CAPILLARY: 180 mg/dL — AB (ref 65–99)
GLUCOSE-CAPILLARY: 241 mg/dL — AB (ref 65–99)
GLUCOSE-CAPILLARY: 246 mg/dL — AB (ref 65–99)
Glucose-Capillary: 187 mg/dL — ABNORMAL HIGH (ref 65–99)
Glucose-Capillary: 327 mg/dL — ABNORMAL HIGH (ref 65–99)
Glucose-Capillary: 271 mg/dL — ABNORMAL HIGH (ref 65–99)

## 2017-06-21 LAB — CBC
HEMATOCRIT: 34.5 % — AB (ref 39.0–52.0)
HEMOGLOBIN: 11 g/dL — AB (ref 13.0–17.0)
MCH: 27.2 pg (ref 26.0–34.0)
MCHC: 31.9 g/dL (ref 30.0–36.0)
MCV: 85.2 fL (ref 78.0–100.0)
Platelets: 242 10*3/uL (ref 150–400)
RBC: 4.05 MIL/uL — AB (ref 4.22–5.81)
RDW: 13.5 % (ref 11.5–15.5)
WBC: 6.3 10*3/uL (ref 4.0–10.5)

## 2017-06-21 MED ORDER — INSULIN GLARGINE 100 UNIT/ML ~~LOC~~ SOLN
15.0000 [IU] | Freq: Every day | SUBCUTANEOUS | Status: DC
  Administered 2017-06-21 – 2017-06-23 (×3): 15 [IU] via SUBCUTANEOUS
  Filled 2017-06-21 (×3): qty 0.15

## 2017-06-21 MED ORDER — TRAZODONE HCL 50 MG PO TABS
50.0000 mg | ORAL_TABLET | Freq: Every evening | ORAL | Status: DC | PRN
  Administered 2017-06-22 – 2017-07-04 (×6): 50 mg
  Filled 2017-06-21 (×8): qty 1

## 2017-06-21 NOTE — Progress Notes (Signed)
  Speech Language Pathology Treatment: Cognitive-Linquistic  Patient Details Name: Preciliano Castell MRN: [id number] DOB: 1945/05/27 Today's Date: 06/21/2017 Time: 1500-1510 SLP Time Calculation (min) (ACUTE ONLY): 10 min  Assessment / Plan / Recommendation Clinical Impression  Session coincided with a visit from a friend. Pt trying to mouth conversation, but 100% unintelligible, would not use paper and pen despite cues. Seemed to have decreased visual perceptual skills, also somewhat drowsy. Pt agreeable to attempting electrolarynx, but would not allow tactile cues for positioning and repeatedly placed intraoral device right between his teeth despite verbal cues to place it more posteriorally. Any attempts at spontaneous communication was unintelligible, but pt did count to 8 when instructed to approximate known targets. Shaping of vowels observed but still poorly accurate given placement of device. Discussed pt with case manager and worked on documentation together given that EL from [place] is unlikely. All SLP requirements for EL documentation have been completed.    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  Pt is s/p emergent trach and order for swallow/PMSV evaluations received. CXR showed Low lung volumes with areas of scarring and/or subsegmental atelectasis in the lower lobes of the lungs bilaterally.     Pt has been seen for education re: laryngectomy. Today provided with Electrolarynx to "practice" prior to scheduled procedure 06/16/2017.  Pt reports frustration at not being able "to speak" - educated him that due to his copious secretions and ? impact of XL trach  - he has been unable to use PMSV.  In addition, reeducated him to plan to have laryngectomy performed on Wednesday - therefore need to practice with electrolarynx.        SLP Plan           Recommendations                           [place] Jamaine Quintin, [place] CCC-SLP [phone]  Lynann Beaver 06/21/2017, 3:15 PM

## 2017-06-21 NOTE — Progress Notes (Signed)
PROGRESS NOTE    Vincent Hansen  VVO:[id number] DOB: [birth year] DOA: 05/30/2017 PCP: Patient, No Pcp Per   Brief Narrative:  72 year old with past medical history relevant for hypertension, COPD, type 2 diabetes, prior history of squamous cell carcinoma of the larynx admitted with hypoxia and found to have large laryngeal mass obstructing airway status post emergency tracheostomy placement with 6 Shiley tracheostomy on 05/30/2017 status post extubation on 05/31/2017. Due to continued aspiration risk, he is kept NPO and PEG was placed for nutrition. Total laryngectomy 4/3 by ENT.  Assessment & Plan   Acute respiratory failure with hypoxia secondary to squamous cell carcinoma of the larynx/severe oropharyngeal dysphasia -#6 XLT trach s/p tracheostomy -on 05/30/2017 -Patient had PEG tube placed on 06/09/2017 with initiation of feeding -ENT consulted, status post total laryngectomy on 06/16/2017 -Oncology consulted and appreciated, to prescribe adjuvant systemic chemotherapy and radiation -Speech therapy will continue to follow -Patient does have copious secretions -Tracheal aspirate showed abundant Pseudomonas aeruginosa  -Plan is to possibly start patient on liquids on 06/23/2017 -Overnight patient had episode of SOB, may have had a plug vs volume overload -improved after dose of IV lasix given 4/6  Agitation -improved; possibly secondary to pain  -Did not need as much ativan overnight, will change dosing to q4h PRN  Chronic periodontitis with bone loss  -Patient evaluated by dental medicine on 06/09/2017 and deferred dental treatment pending results of laryngectomy  Hiatal hernia -CT the abdomen showed concern for possible gastric adenocarcinoma versus hiatal hernia -Patient had EGD on 06/07/2017 which showed no evidence of gastric adenocarcinoma but did show medium sized hiatal hernia -Continue PPI  Essential hypertension -Continue labetalol as needed, BP currently controlled  Diabetes  mellitus, type II -Continue Lantus, insulin sliding scale, CBG monitoring  COPD -Currently without exacerbation, no wheezing -Continue around on Pulmicort, pulmonary toilet postoperatively  Malnutrition of moderate degree -Patient has PEG tube in place and is currently on tube feeds -Plan is for n.p.o. for 5 days at which point ENT will reevaluate patient to see if he can be on clear liquids  Mild oliguria -Renal ultrasound obtained showing no hydronephrosis or obstruction.  Enlarged prostate gland noted.  Multiple bilateral renal cysts (follow-up ultrasound in 12 months recommended) -creatinine currently stable -given SOB overnight, discontinued IVF, given dose of lasix on 06/19/2017 -Urine output 2300cc over past 24 hours -Remove foley catheter and place condom cath- discussed with RN  DVT Prophylaxis  SCDs  Code Status: Full  Family Communication: None at bedside  Disposition Plan: Admitted. Dispo to SNF, however not for several weeks.   Consultants ENT Interventional radiology Gastroenterology Oncology Dental surgery  Procedures  Emergent trach and laryngoscopy on 05/30/2017 by Dr. Constance Holster EGD on 06/07/2017 Total laryngectomy on 06/16/2017  Antibiotics   Anti-infectives (From admission, onward)   Start     Dose/Rate Route Frequency Ordered Stop   06/16/17 1800  clindamycin (CLEOCIN) IVPB 600 mg     600 mg 100 mL/hr over 30 Minutes Intravenous Every 8 hours 06/16/17 1658 06/18/17 1425   06/16/17 0000  clindamycin (CLEOCIN) IVPB 900 mg     900 mg 100 mL/hr over 30 Minutes Intravenous On call to O.R. 06/15/17 1716 06/16/17 1307   06/09/17 1500  ceFAZolin (ANCEF) IVPB 2g/100 mL premix     2 g 200 mL/hr over 30 Minutes Intravenous To Radiology 06/08/17 1427 06/09/17 1410      Subjective:   Vincent Hansen seen and examined today.  No new complaints today. Still upset that  he cannot speak or eat. Denies chest pain, current shortness of breath, abdominal pain.    Objective:   Vitals:   06/21/17 0700 06/21/17 0800 06/21/17 0830 06/21/17 1010  BP: (!) 163/92     Pulse: 80     Resp: 15     Temp:  98.6 F (37 C)    TempSrc:  Axillary    SpO2: 97%  96% 91%  Weight:      Height:        Intake/Output Summary (Last 24 hours) at 06/21/2017 1108 Last data filed at 06/21/2017 1000 Gross per 24 hour  Intake 2222.84 ml  Output 1875 ml  Net 347.84 ml   Filed Weights   06/19/17 0500 06/20/17 0400 06/21/17 0402  Weight: 81.8 kg (180 lb 5.4 oz) 79 kg (174 lb 2.6 oz) 78.8 kg (173 lb 11.6 oz)   Exam  General: Well developed, chronically ill appearing, NAD  HEENT: NCAT, mucous membranes moist.   Neck: Trach  Cardiovascular: S1 S2 auscultated, no rubs, murmurs or gallops. Regular rate and rhythm.  Respiratory: Clear to auscultation bilaterally with equal chest rise  Abdomen: Soft, nontender, nondistended, + bowel sounds, +peg  Extremities: warm dry without cyanosis clubbing or edema  Neuro: AAOx3, nonfocal  Psych: appropriate mood and affect  Data Reviewed: I have personally reviewed following labs and imaging studies  CBC: Recent Labs  Lab 06/15/17 0519 06/16/17 0420 06/18/17 0500 06/19/17 0355 06/21/17 0336  WBC 5.6 4.4 6.2 7.8 6.3  HGB 11.1* 10.9* 9.6* 9.2* 11.0*  HCT 35.3* 35.2* 30.5* 29.6* 34.5*  MCV 85.9 86.9 86.9 86.5 85.2  PLT 270 239 210 208 540   Basic Metabolic Panel: Recent Labs  Lab 06/15/17 0519 06/16/17 0420 06/18/17 0500 06/19/17 0355 06/20/17 0305 06/21/17 0336  NA 144 141 138 139 139 135  K 3.7 3.9 4.0 4.7 4.4 4.9  CL 101 96* 98* 99* 96* 95*  CO2 35* 34* 32 31 33* 30  GLUCOSE 185* 248* 178* 198* 230* 247*  BUN '10 11 20 '$ 21* 13 10  CREATININE 0.92 1.16 1.23 1.19 1.04 0.99  CALCIUM 9.0 8.9 8.2* 8.2* 8.7* 8.7*  MG 1.5* 1.9  --   --   --   --   PHOS 2.8 3.2  --   --   --   --    GFR: Estimated Creatinine Clearance: 75.1 mL/min (by C-G formula based on SCr of 0.99 mg/dL). Liver Function Tests: No  results for input(s): AST, ALT, ALKPHOS, BILITOT, PROT, ALBUMIN in the last 168 hours. No results for input(s): LIPASE, AMYLASE in the last 168 hours. No results for input(s): AMMONIA in the last 168 hours. Coagulation Profile: No results for input(s): INR, PROTIME in the last 168 hours. Cardiac Enzymes: No results for input(s): CKTOTAL, CKMB, CKMBINDEX, TROPONINI in the last 168 hours. BNP (last 3 results) No results for input(s): PROBNP in the last 8760 hours. HbA1C: No results for input(s): HGBA1C in the last 72 hours. CBG: Recent Labs  Lab 06/20/17 1547 06/20/17 1955 06/20/17 2320 06/21/17 0346 06/21/17 0816  GLUCAP 246* 228* 261* 246* 187*   Lipid Profile: No results for input(s): CHOL, HDL, LDLCALC, TRIG, CHOLHDL, LDLDIRECT in the last 72 hours. Thyroid Function Tests: No results for input(s): TSH, T4TOTAL, FREET4, T3FREE, THYROIDAB in the last 72 hours. Anemia Panel: No results for input(s): VITAMINB12, FOLATE, FERRITIN, TIBC, IRON, RETICCTPCT in the last 72 hours. Urine analysis:    Component Value Date/Time   COLORURINE YELLOW 05/30/2017 1330  APPEARANCEUR CLEAR 05/30/2017 1330   LABSPEC 1.020 05/30/2017 1330   PHURINE 5.0 05/30/2017 1330   GLUCOSEU >=500 (A) 05/30/2017 1330   HGBUR MODERATE (A) 05/30/2017 1330   BILIRUBINUR NEGATIVE 05/30/2017 1330   KETONESUR 5 (A) 05/30/2017 1330   PROTEINUR >=300 (A) 05/30/2017 1330   NITRITE NEGATIVE 05/30/2017 1330   LEUKOCYTESUR TRACE (A) 05/30/2017 1330   Sepsis Labs: '@LABRCNTIP'$ (procalcitonin:4,lacticidven:4)  ) Recent Results (from the past 240 hour(s))  Culture, respiratory (NON-Expectorated)     Status: None   Collection Time: 06/14/17  3:30 PM  Result Value Ref Range Status   Specimen Description   Final    TRACHEAL ASPIRATE Performed at [facility], [place] [address], [place], [place] [address]    Special Requests   Final    NONE Performed at [facility], [place]  [address] [place], [place] [address]    Gram Stain   Final    ABUNDANT WBC PRESENT,BOTH PMN AND MONONUCLEAR MODERATE GRAM VARIABLE ROD FEW GRAM POSITIVE COCCI Performed at [facility], [place] [address], [place], [place] [address]    Culture ABUNDANT PSEUDOMONAS AERUGINOSA  Final   Report Status 06/17/2017 FINAL  Final   Organism ID, Bacteria PSEUDOMONAS AERUGINOSA  Final      Susceptibility   Pseudomonas aeruginosa - MIC*    CEFTAZIDIME 4 SENSITIVE Sensitive     CIPROFLOXACIN <=0.25 SENSITIVE Sensitive     GENTAMICIN 2 SENSITIVE Sensitive     IMIPENEM 1 SENSITIVE Sensitive     PIP/TAZO 8 SENSITIVE Sensitive     CEFEPIME 2 SENSITIVE Sensitive     * ABUNDANT PSEUDOMONAS AERUGINOSA  Surgical pcr screen     Status: None   Collection Time: 06/15/17  5:15 PM  Result Value Ref Range Status   MRSA, PCR NEGATIVE NEGATIVE Final   Staphylococcus aureus NEGATIVE NEGATIVE Final    Comment: (NOTE) The Xpert SA Assay (FDA approved for NASAL specimens in patients 33 years of age and older), is one component of a comprehensive surveillance program. It is not intended to diagnose infection nor to guide or monitor treatment. Performed at [facility], [place] [address], [place], [place] [address]       Radiology Studies: No results found.   Scheduled Meds: . arformoterol  15 mcg Nebulization BID  . bacitracin  1 application Topical L9F  . budesonide (PULMICORT) nebulizer solution  0.5 mg Nebulization BID  . chlorhexidine gluconate (MEDLINE KIT)  15 mL Mouth Rinse BID  . docusate  100 mg Per Tube BID  . feeding supplement (JEVITY 1.5 CAL/FIBER)  355 mL Per Tube QID  . feeding supplement (PRO-STAT SUGAR FREE 64)  30 mL Per Tube Daily  . free water  100 mL Per Tube TID  . free water  120 mL Per Tube QID  . haloperidol lactate  2 mg Intravenous Once  . insulin aspart  0-15 Units Subcutaneous Q4H  . insulin glargine  10 Units Subcutaneous QHS  . ipratropium-albuterol  3 mL  Nebulization TID  . mouth rinse  15 mL Mouth Rinse QID  . morphine CONCENTRATE  10 mg Per Tube Q8H  . pantoprazole sodium  40 mg Per Tube Daily  . sennosides  5 mL Per Tube QHS   Continuous Infusions: . lactated ringers 10 mL/hr at 06/20/17 1900     LOS: 22 days   Time Spent in minutes   30 minutes  Dorothe Elmore D.O. on 06/21/2017 at 11:08 AM  Between 7am to 7pm - Pager - [phone]  After 7pm go to [email or web address] - password [email or web address]  And look for the night coverage person covering for me after hours  [facility] Office  [phone]

## 2017-06-21 NOTE — Progress Notes (Signed)
Physical Therapy Treatment Patient Details Name: Malick Netz MRN: [id number] DOB: [birth year] Today's Date: 06/21/2017    History of Present Illness 72 year old with PMHx: HTN, COPD, DM, prior history of squamous cell carcinoma of the larynx admitted with hypoxia and found to have large laryngeal mass obstructing airway status post emergency tracheostomy 05/30/2017 extubation on 05/31/2017. Pt with PEG. Total laryngectomy 4/3    PT Comments    Pt with eyes closed at rest and not writing or communicating clearly today. Pt attempting to get OOB on arrival with legs off of mattress and cues for safety to return to bed. Pt with increased assist for gait and limited tolerance for distance today. Pt requires assist for all transfers and unable to meet household distance or speed for gait and will continue to work toward these goals. Pt fatigued end of session and unable to complete HEP.     Follow Up Recommendations  SNF;Supervision for mobility/OOB     Equipment Recommendations  Rolling walker with 5" wheels    Recommendations for Other Services       Precautions / Restrictions Precautions Precautions: Fall Precaution Comments: trach    Mobility  Bed Mobility Overal bed mobility: Needs Assistance Bed Mobility: Supine to Sit     Supine to sit: Min assist     General bed mobility comments: bil UE assist with cues for sequence and assist to elevate trunk, cues for completion of transfers with increased time to fully reach EOB  Transfers Overall transfer level: Needs assistance   Transfers: Sit to/from Stand Sit to Stand: Min assist;+2 safety/equipment         General transfer comment: min assist to rise with cues for hand placement and assist for lines  Ambulation/Gait Ambulation/Gait assistance: +2 safety/equipment;Mod assist Ambulation Distance (Feet): 47 Feet Assistive device: Rolling walker (2 wheeled) Gait Pattern/deviations: Step-through pattern;Decreased stride  length;Trunk flexed   Gait velocity interpretation: Below normal speed for age/gender General Gait Details: pt with left lean onto therapist with gait, maintaining self too posterior to RW despite max cues for posture, position in RW and safety, chair to follow for safety and assist for lines. Pt walked 47' and 40' respectively with grossly 25min seated rest with SpO2 91-95% on RA   Stairs            Wheelchair Mobility    Modified Rankin (Stroke Patients Only)       Balance     Sitting balance-Leahy Scale: Good       Standing balance-Leahy Scale: Poor                              Cognition Arousal/Alertness: Awake/alert Behavior During Therapy: Flat affect Overall Cognitive Status: Difficult to assess                                 General Comments: pt not writing and when attempting to use hands to state height it didn't make sense. Pt nodding appropriately at times      Exercises      General Comments        Pertinent Vitals/Pain Pain Score: 5  Pain Location: neck and chest Pain Descriptors / Indicators: Sore Pain Intervention(s): Limited activity within patient's tolerance;Monitored during session    Home Living  Prior Function            PT Goals (current goals can now be found in the care plan section) Acute Rehab PT Goals Time For Goal Achievement: 07/05/17 Potential to Achieve Goals: Fair Progress towards PT goals: Progressing toward goals(goals updated given pt medical changes)    Frequency           PT Plan Current plan remains appropriate    Co-evaluation              AM-PAC PT "6 Clicks" Daily Activity  Outcome Measure  Difficulty turning over in bed (including adjusting bedclothes, sheets and blankets)?: A Lot Difficulty moving from lying on back to sitting on the side of the bed? : Unable Difficulty sitting down on and standing up from a chair with arms (e.g.,  wheelchair, bedside commode, etc,.)?: Unable Help needed moving to and from a bed to chair (including a wheelchair)?: A Lot Help needed walking in hospital room?: A Lot Help needed climbing 3-5 steps with a railing? : A Lot 6 Click Score: 10    End of Session Equipment Utilized During Treatment: Gait belt Activity Tolerance: Patient limited by fatigue Patient left: in chair;with call bell/phone within reach;with chair alarm set;with nursing/sitter in room Nurse Communication: Mobility status;Precautions PT Visit Diagnosis: Other abnormalities of gait and mobility (R26.89);Difficulty in walking, not elsewhere classified (R26.2)     Time: 2878-6767 PT Time Calculation (min) (ACUTE ONLY): 27 min  Charges:  $Gait Training: 8-22 mins $Therapeutic Activity: 8-22 mins                    G Codes:       Elwyn Reach, PT [phone]    [place] 06/21/2017, 10:17 AM

## 2017-06-21 NOTE — Progress Notes (Signed)
Patient ID: Vincent Hansen, male   DOB: [birth year], 72 y.o.   MRN: [id number] Subjective: Sleepy but arousable.  No new complaints.  Objective: Vital signs in last 24 hours: Temp:  [98 F (36.7 C)-98.6 F (37 C)] 98.3 F (36.8 C) (04/08 0402) Pulse Rate:  [72-103] 80 (04/08 0700) Resp:  [14-23] 15 (04/08 0700) BP: (115-163)/(68-120) 163/92 (04/08 0700) SpO2:  [92 %-100 %] 97 % (04/08 0700) FiO2 (%):  [28 %-35 %] 28 % (04/08 0402) Weight:  [78.8 kg (173 lb 11.6 oz)] 78.8 kg (173 lb 11.6 oz) (04/08 0402) Weight change: -0.2 kg (-7.1 oz) Last BM Date: 06/13/17  Intake/Output from previous day: 04/07 0701 - 04/08 0700 In: 2282.8 [I.V.:182.8; NG/GT:2100] Out: 2310 [Urine:2300; Drains:10] Intake/Output this shift: No intake/output data recorded.  PHYSICAL EXAM: Looks good looks excellent.  Slight erythema just above the stoma probably from the tracheostomy tube.  The tube was removed and easily replaced.  Stoma is nice and clear.  Lab Results: Recent Labs    06/19/17 0355 06/21/17 0336  WBC 7.8 6.3  HGB 9.2* 11.0*  HCT 29.6* 34.5*  PLT 208 242   BMET Recent Labs    06/20/17 0305 06/21/17 0336  NA 139 135  K 4.4 4.9  CL 96* 95*  CO2 33* 30  GLUCOSE 230* 247*  BUN 13 10  CREATININE 1.04 0.99  CALCIUM 8.7* 8.7*    Studies/Results: No results found.  Medications: I have reviewed the patient's current medications.  Assessment/Plan: Postop day 5, doing well so far.  Plan to start oral liquids on Wednesday.  Awaiting the laryngectomy tube to arrive.  That should make things more comfortable for him.  Continue medical management.  Transfer to floor when room available.  Consider Foley removal.  LOS: 22 days   [name] 06/21/2017, 8:25 AM

## 2017-06-22 LAB — CBC
HEMATOCRIT: 33.7 % — AB (ref 39.0–52.0)
Hemoglobin: 10.3 g/dL — ABNORMAL LOW (ref 13.0–17.0)
MCH: 25.6 pg — AB (ref 26.0–34.0)
MCHC: 30.6 g/dL (ref 30.0–36.0)
MCV: 83.8 fL (ref 78.0–100.0)
PLATELETS: 273 10*3/uL (ref 150–400)
RBC: 4.02 MIL/uL — AB (ref 4.22–5.81)
RDW: 13.1 % (ref 11.5–15.5)
WBC: 7.3 10*3/uL (ref 4.0–10.5)

## 2017-06-22 LAB — BASIC METABOLIC PANEL
ANION GAP: 8 (ref 5–15)
BUN: 12 mg/dL (ref 6–20)
CO2: 32 mmol/L (ref 22–32)
Calcium: 9.2 mg/dL (ref 8.9–10.3)
Chloride: 96 mmol/L — ABNORMAL LOW (ref 101–111)
Creatinine, Ser: 0.95 mg/dL (ref 0.61–1.24)
GFR calc Af Amer: >60 mL/min (ref >60)
GLUCOSE: 183 mg/dL — AB (ref 65–99)
POTASSIUM: 4.7 mmol/L (ref 3.5–5.1)
Sodium: 136 mmol/L (ref 135–145)

## 2017-06-22 LAB — GLUCOSE, CAPILLARY
GLUCOSE-CAPILLARY: 257 mg/dL — AB (ref 65–99)
Glucose-Capillary: 193 mg/dL — ABNORMAL HIGH (ref 65–99)
Glucose-Capillary: 122 mg/dL — ABNORMAL HIGH (ref 65–99)
Glucose-Capillary: 239 mg/dL — ABNORMAL HIGH (ref 65–99)
Glucose-Capillary: 286 mg/dL — ABNORMAL HIGH (ref 65–99)
Glucose-Capillary: 228 mg/dL — ABNORMAL HIGH (ref 65–99)

## 2017-06-22 MED ORDER — SODIUM CHLORIDE 0.9% FLUSH: 10.0000 mL | INTRAVENOUS | Status: DC | PRN

## 2017-06-22 MED ORDER — CHLORHEXIDINE GLUCONATE 0.12 % MT SOLN
OROMUCOSAL | Status: AC
  Administered 2017-06-22: 15 mL via OROMUCOSAL
  Filled 2017-06-22: qty 15

## 2017-06-22 MED ORDER — SODIUM CHLORIDE 0.9% FLUSH: 10.0000 mL | Freq: Two times a day (BID) | INTRAVENOUS | Status: DC

## 2017-06-22 MED ORDER — HALOPERIDOL LACTATE 5 MG/ML IJ SOLN
2.0000 mg | Freq: Four times a day (QID) | INTRAMUSCULAR | Status: DC | PRN
  Administered 2017-06-23 – 2017-06-25 (×5): 2 mg via INTRAVENOUS
  Filled 2017-06-22 (×6): qty 1

## 2017-06-22 NOTE — Progress Notes (Signed)
Patient ID: Vincent Hansen, male   DOB: 05/26/1945, 72 y.o.   MRN: [id number] Subjective: No new complaints.  Objective: Vital signs in last 24 hours: Temp:  [97.8 F (36.6 C)-98.9 F (37.2 C)] 98.2 F (36.8 C) (04/09 0800) Pulse Rate:  [74-94] 88 (04/09 0849) Resp:  [11-24] 12 (04/09 0849) BP: (113-156)/(67-99) 115/98 (04/09 0849) SpO2:  [91 %-100 %] 94 % (04/09 0845) FiO2 (%):  [28 %] 28 % (04/09 0849) Weight:  [75.4 kg (166 lb 3.6 oz)] 75.4 kg (166 lb 3.6 oz) (04/09 0438) Weight change: -3.4 kg (-7 lb 7.9 oz) Last BM Date: 06/21/17  Intake/Output from previous day: 04/08 0701 - 04/09 0700 In: 2425 [I.V.:170; NG/GT:2255] Out: 2460 [Urine:2460] Intake/Output this shift: No intake/output data recorded.  PHYSICAL EXAM: He is awake and alert.  I have removed the tracheostomy and replaced it with a laryngectomy tube which fit perfectly.  He is breathing comfortably.  It is that they securely in place with straps.  Neck looks great otherwise.  Drains are out.  Lab Results: Recent Labs    06/21/17 0336 06/22/17 0311  WBC 6.3 7.3  HGB 11.0* 10.3*  HCT 34.5* 33.7*  PLT 242 273   BMET Recent Labs    06/21/17 0336 06/22/17 0311  NA 135 136  K 4.9 4.7  CL 95* 96*  CO2 30 32  GLUCOSE 247* 183*  BUN 10 12  CREATININE 0.99 0.95  CALCIUM 8.7* 9.2    Studies/Results: No results found.  Medications: I have reviewed the patient's current medications.  Assessment/Plan: Stable postop day 6.  Tomorrow we will try liquids.  Continue with laryngectomy tube.  LOS: 23 days   [name] 06/22/2017, 8:57 AM

## 2017-06-22 NOTE — Progress Notes (Signed)
PROGRESS NOTE    Vincent Hansen  NWG:[id number] DOB: 1946/02/24 DOA: 05/30/2017 PCP: Patient, No Pcp Per   Brief Narrative:  72 year old with past medical history relevant for hypertension, COPD, type 2 diabetes, prior history of squamous cell carcinoma of the larynx admitted with hypoxia and found to have large laryngeal mass obstructing airway status post emergency tracheostomy placement with 6 Shiley tracheostomy on 05/30/2017 status post extubation on 05/31/2017. Due to continued aspiration risk, he is kept NPO and PEG was placed for nutrition. Total laryngectomy 4/3 by ENT. Discussed with ENT on 06/21/2017, has graciously accepted to assume care as primary. [facility] will continue to follow the patient in consult.  Assessment & Plan   Acute respiratory failure with hypoxia secondary to squamous cell carcinoma of the larynx/severe oropharyngeal dysphasia -#6 XLT trach s/p tracheostomy -on 05/30/2017 -Patient had PEG tube placed on 06/09/2017 with initiation of feeding -ENT following, status post total laryngectomy on 06/16/2017 -Oncology consulted and appreciated, to prescribe adjuvant systemic chemotherapy and radiation -Speech therapy will continue to follow -Patient does have copious secretions -Tracheal aspirate showed abundant Pseudomonas aeruginosa  -Plan is to possibly start patient on liquids on 06/23/2017 -Overnight patient had episode of SOB, may have had a plug vs volume overload -improved after dose of IV lasix given 4/6  Agitation -improved; possibly secondary to pain  -Continue ativan q4h PRN  Chronic periodontitis with bone loss  -Patient evaluated by dental medicine on 06/09/2017 and deferred dental treatment pending results of laryngectomy  Hiatal hernia -CT the abdomen showed concern for possible gastric adenocarcinoma versus hiatal hernia -Patient had EGD on 06/07/2017 which showed no evidence of gastric adenocarcinoma but did show medium sized hiatal hernia -Continue  PPI  Essential hypertension -Continue labetalol as needed, BP currently controlled  Diabetes mellitus, type II -Continue Lantus (increased to 15u), insulin sliding scale, CBG monitoring  COPD -Currently without exacerbation, no wheezing -Continue around on Pulmicort, pulmonary toilet postoperatively  Malnutrition of moderate degree -Patient has PEG tube in place and is currently on tube feeds -Plan is for n.p.o. For 7 days (until 4/10) at which point ENT will reevaluate patient to see if he can be on clear liquids  Mild oliguria -Renal ultrasound obtained showing no hydronephrosis or obstruction.  Enlarged prostate gland noted.  Multiple bilateral renal cysts (follow-up ultrasound in 12 months recommended) -creatinine currently stable -given SOB overnight, discontinued IVF, given dose of lasix on 06/19/2017 -Urine output 2460cc over past 24 hours  DVT Prophylaxis  SCDs  Code Status: Full  Family Communication: None at bedside  Disposition Plan: Admitted. Dispo to SNF, however not for several weeks.   Consultants ENT -- now primary Interventional radiology Gastroenterology Oncology Dental surgery  Procedures  Emergent trach and laryngoscopy on 05/30/2017 by Dr. Constance Holster EGD on 06/07/2017 Total laryngectomy on 06/16/2017  Antibiotics   Anti-infectives (From admission, onward)   Start     Dose/Rate Route Frequency Ordered Stop   06/16/17 1800  clindamycin (CLEOCIN) IVPB 600 mg     600 mg 100 mL/hr over 30 Minutes Intravenous Every 8 hours 06/16/17 1658 06/18/17 1425   06/16/17 0000  clindamycin (CLEOCIN) IVPB 900 mg     900 mg 100 mL/hr over 30 Minutes Intravenous On call to O.R. 06/15/17 1716 06/16/17 1307   06/09/17 1500  ceFAZolin (ANCEF) IVPB 2g/100 mL premix     2 g 200 mL/hr over 30 Minutes Intravenous To Radiology 06/08/17 1427 06/09/17 1410      Subjective:   Vincent Hansen seen  and examined today.  No new complaints today.   Objective:   Vitals:    06/22/17 1000 06/22/17 1100 06/22/17 1200 06/22/17 1210  BP: (!) 125/91 127/76 (!) 133/99 (!) 139/99  Pulse: 81 85 84 85  Resp: (!) '24 13 18 '$ (!) 23  Temp:      TempSrc:      SpO2: 100% 98% 99% 97%  Weight:      Height:        Intake/Output Summary (Last 24 hours) at 06/22/2017 1339 Last data filed at 06/22/2017 1200 Gross per 24 hour  Intake 1605 ml  Output 2160 ml  Net -555 ml   Filed Weights   06/20/17 0400 06/21/17 0402 06/22/17 0438  Weight: 79 kg (174 lb 2.6 oz) 78.8 kg (173 lb 11.6 oz) 75.4 kg (166 lb 3.6 oz)   Exam  General: Well developed, chronically ill-appearing, no apparent distress  HEENT: NCAT, mucous membranes moist.   Neck: +Trach  Cardiovascular: S1 S2 auscultated, no rubs, murmurs or gallops. Regular rate and rhythm.  Respiratory: Clear to auscultation bilaterally with equal chest rise  Abdomen: Soft, nontender, nondistended, + bowel sounds, +peg  Extremities: warm dry without cyanosis clubbing or edema  Neuro: AAOx3, nonfocal  Psych: Appropriate   Data Reviewed: I have personally reviewed following labs and imaging studies  CBC: Recent Labs  Lab 06/16/17 0420 06/18/17 0500 06/19/17 0355 06/21/17 0336 06/22/17 0311  WBC 4.4 6.2 7.8 6.3 7.3  HGB 10.9* 9.6* 9.2* 11.0* 10.3*  HCT 35.2* 30.5* 29.6* 34.5* 33.7*  MCV 86.9 86.9 86.5 85.2 83.8  PLT 239 210 208 242 469   Basic Metabolic Panel: Recent Labs  Lab 06/16/17 0420 06/18/17 0500 06/19/17 0355 06/20/17 0305 06/21/17 0336 06/22/17 0311  NA 141 138 139 139 135 136  K 3.9 4.0 4.7 4.4 4.9 4.7  CL 96* 98* 99* 96* 95* 96*  CO2 34* 32 31 33* 30 32  GLUCOSE 248* 178* 198* 230* 247* 183*  BUN 11 20 21* '13 10 12  '$ CREATININE 1.16 1.23 1.19 1.04 0.99 0.95  CALCIUM 8.9 8.2* 8.2* 8.7* 8.7* 9.2  MG 1.9  --   --   --   --   --   PHOS 3.2  --   --   --   --   --    GFR: Estimated Creatinine Clearance: 76.1 mL/min (by C-G formula based on SCr of 0.95 mg/dL). Liver Function Tests: No results  for input(s): AST, ALT, ALKPHOS, BILITOT, PROT, ALBUMIN in the last 168 hours. No results for input(s): LIPASE, AMYLASE in the last 168 hours. No results for input(s): AMMONIA in the last 168 hours. Coagulation Profile: No results for input(s): INR, PROTIME in the last 168 hours. Cardiac Enzymes: No results for input(s): CKTOTAL, CKMB, CKMBINDEX, TROPONINI in the last 168 hours. BNP (last 3 results) No results for input(s): PROBNP in the last 8760 hours. HbA1C: No results for input(s): HGBA1C in the last 72 hours. CBG: Recent Labs  Lab 06/21/17 1958 06/21/17 2343 06/22/17 0330 06/22/17 0818 06/22/17 1200  GLUCAP 271* 180* 193* 122* 239*   Lipid Profile: No results for input(s): CHOL, HDL, LDLCALC, TRIG, CHOLHDL, LDLDIRECT in the last 72 hours. Thyroid Function Tests: No results for input(s): TSH, T4TOTAL, FREET4, T3FREE, THYROIDAB in the last 72 hours. Anemia Panel: No results for input(s): VITAMINB12, FOLATE, FERRITIN, TIBC, IRON, RETICCTPCT in the last 72 hours. Urine analysis:    Component Value Date/Time   COLORURINE YELLOW 05/30/2017 1330  APPEARANCEUR CLEAR 05/30/2017 1330   LABSPEC 1.020 05/30/2017 1330   PHURINE 5.0 05/30/2017 1330   GLUCOSEU >=500 (A) 05/30/2017 1330   HGBUR MODERATE (A) 05/30/2017 1330   BILIRUBINUR NEGATIVE 05/30/2017 1330   KETONESUR 5 (A) 05/30/2017 1330   PROTEINUR >=300 (A) 05/30/2017 1330   NITRITE NEGATIVE 05/30/2017 1330   LEUKOCYTESUR TRACE (A) 05/30/2017 1330   Sepsis Labs: '@LABRCNTIP'$ (procalcitonin:4,lacticidven:4)  ) Recent Results (from the past 240 hour(s))  Culture, respiratory (NON-Expectorated)     Status: None   Collection Time: 06/14/17  3:30 PM  Result Value Ref Range Status   Specimen Description   Final    TRACHEAL ASPIRATE Performed at [facility], [place] [address], [place], [place] [address]    Special Requests   Final    NONE Performed at [facility], [place] [address],  [place], [place] [address]    Gram Stain   Final    ABUNDANT WBC PRESENT,BOTH PMN AND MONONUCLEAR MODERATE GRAM VARIABLE ROD FEW GRAM POSITIVE COCCI Performed at [facility], [place] [address] [place], [place] [address]    Culture ABUNDANT PSEUDOMONAS AERUGINOSA  Final   Report Status 06/17/2017 FINAL  Final   Organism ID, Bacteria PSEUDOMONAS AERUGINOSA  Final      Susceptibility   Pseudomonas aeruginosa - MIC*    CEFTAZIDIME 4 SENSITIVE Sensitive     CIPROFLOXACIN <=0.25 SENSITIVE Sensitive     GENTAMICIN 2 SENSITIVE Sensitive     IMIPENEM 1 SENSITIVE Sensitive     PIP/TAZO 8 SENSITIVE Sensitive     CEFEPIME 2 SENSITIVE Sensitive     * ABUNDANT PSEUDOMONAS AERUGINOSA  Surgical pcr screen     Status: None   Collection Time: 06/15/17  5:15 PM  Result Value Ref Range Status   MRSA, PCR NEGATIVE NEGATIVE Final   Staphylococcus aureus NEGATIVE NEGATIVE Final    Comment: (NOTE) The Xpert SA Assay (FDA approved for NASAL specimens in patients 38 years of age and older), is one component of a comprehensive surveillance program. It is not intended to diagnose infection nor to guide or monitor treatment. Performed at [facility], [place] [address] [place], [place] [address]       Radiology Studies: No results found.   Scheduled Meds: . arformoterol  15 mcg Nebulization BID  . bacitracin  1 application Topical Q4O  . budesonide (PULMICORT) nebulizer solution  0.5 mg Nebulization BID  . chlorhexidine gluconate (MEDLINE KIT)  15 mL Mouth Rinse BID  . docusate  100 mg Per Tube BID  . feeding supplement (JEVITY 1.5 CAL/FIBER)  355 mL Per Tube QID  . feeding supplement (PRO-STAT SUGAR FREE 64)  30 mL Per Tube Daily  . free water  120 mL Per Tube QID  . haloperidol lactate  2 mg Intravenous Once  . insulin aspart  0-15 Units Subcutaneous Q4H  . insulin glargine  15 Units Subcutaneous QHS  . ipratropium-albuterol  3 mL Nebulization TID  . mouth rinse  15 mL Mouth Rinse QID   . morphine CONCENTRATE  10 mg Per Tube Q8H  . pantoprazole sodium  40 mg Per Tube Daily  . sennosides  5 mL Per Tube QHS   Continuous Infusions: . lactated ringers Stopped (06/21/17 1900)     LOS: 23 days   Time Spent in minutes   30 minutes  Ivanka Kirshner D.O. on 06/22/2017 at 1:39 PM  Between 7am to 7pm - Pager - [phone]  After 7pm go  to [email or web address] - password [email or web address]  And look for the night coverage person covering for me after hours  [facility] Office  [phone]

## 2017-06-23 LAB — GLUCOSE, CAPILLARY
GLUCOSE-CAPILLARY: 281 mg/dL — AB (ref 65–99)
Glucose-Capillary: 179 mg/dL — ABNORMAL HIGH (ref 65–99)
Glucose-Capillary: 129 mg/dL — ABNORMAL HIGH (ref 65–99)
Glucose-Capillary: 197 mg/dL — ABNORMAL HIGH (ref 65–99)
Glucose-Capillary: 243 mg/dL — ABNORMAL HIGH (ref 65–99)
Glucose-Capillary: 299 mg/dL — ABNORMAL HIGH (ref 65–99)

## 2017-06-23 MED ORDER — IPRATROPIUM-ALBUTEROL 0.5-2.5 (3) MG/3ML IN SOLN
3.0000 mL | RESPIRATORY_TRACT | Status: DC | PRN
  Administered 2017-06-29: 3 mL via RESPIRATORY_TRACT
  Filled 2017-06-23: qty 3

## 2017-06-23 MED ORDER — CHLORHEXIDINE GLUCONATE 0.12 % MT SOLN
OROMUCOSAL | Status: AC
  Administered 2017-06-23: 15 mL via OROMUCOSAL
  Filled 2017-06-23: qty 15

## 2017-06-23 NOTE — Progress Notes (Signed)
  Speech Language Pathology Treatment: Cognitive-Linquistic  Patient Details Name: Vincent Hansen MRN: [id number] DOB: 07-22-1945 Today's Date: 06/23/2017 Time: 0932-3557 SLP Time Calculation (min) (ACUTE ONLY): 15 min  Assessment / Plan / Recommendation Clinical Impression  Pts cognition is the main barrier to progression. He is alert but restless in restraints. Does not follow commands and is repeatedly trying to leave bed without realization that restraints will prevent this. Pt is resistant to intraoral electrolarynx device so attempted EL external placement on various sites with total assist as pt mouthed words. Pt articulated "hello" on command several times, but no oral resonance with vibration from device observed. Pt easily frustrated and agitated by restraint. No progress with functional communication achieved this week. There are occasional references to psychiatric problems in chart, certainly on arrival, but in later ENT notes as well. Question if medical intervention for behavior would be beneficial for pts comfort as pts cognition will continue to be a barrier to safety and progress.   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  Pt is s/p emergent trach and order for swallow/PMSV evaluations received. CXR showed Low lung volumes with areas of scarring and/or subsegmental atelectasis in the lower lobes of the lungs bilaterally.     Pt has been seen for education re: laryngectomy. Today provided with Electrolarynx to "practice" prior to scheduled procedure 06/16/2017.  Pt reports frustration at not being able "to speak" - educated him that due to his copious secretions and ? impact of XL trach  - he has been unable to use PMSV.  In addition, reeducated him to plan to have laryngectomy performed on Wednesday - therefore need to practice with electrolarynx.       SLP Plan          Recommendations                   Oral Care Recommendations: Oral care BID Follow up Recommendations: Skilled Nursing facility SLP Visit Diagnosis: Aphonia (R49.1);Other (comment)       GO                Vincent Hansen, Katherene Ponto 06/23/2017, 1:33 PM

## 2017-06-23 NOTE — Progress Notes (Signed)
Patient ID: Vincent Hansen, male   DOB: 13-Dec-1945, 72 y.o.   MRN: [id number]  PROGRESS NOTE    Vincent Hansen  FIE:[id number] DOB: 1945-04-15 DOA: 05/30/2017 PCP: Patient, No Pcp Per   Brief Narrative:  72 year old male with history of hypertension, COPD, type 2 diabetes, prior history of squamous cell carcinoma of the larynx was admitted with hypoxia on 05/30/2017 and found to have large laryngeal mass obstructing airway status post emergent tracheostomy placement on 05/30/2017 and subsequent extubation on 05/31/2017.  During the hospitalization, PEG tube was placed for nutrition.  He underwent total laryngectomy on 06/16/2017 by ENT.  He was transferred to ENT service on 06/21/2017.  TRH is following the patient in consult.   Assessment & Plan:   Principal Problem:   Squamous cell carcinoma of larynx (HCC) Active Problems:   Acute respiratory failure with hypoxia (HCC)   Status post tracheostomy (HCC)   Malnutrition of moderate degree   Laryngeal carcinoma (HCC)   DM2 (diabetes mellitus, type 2) (HCC)   HTN (hypertension)   Abnormal CT scan, stomach   Hiatal hernia   Tracheostomy status (HCC)   Periodontitis, chronic   S/P laryngectomy   Post-operative complication   Acute respiratory failure with hypoxia secondary to squamous cell carcinoma of the larynx/severe oropharyngeal dysphagia -s/p tracheostomy-on 05/30/2017 -Patient had PEG tube placed on 06/09/2017 with initiation of feeding -ENT following, status post total laryngectomy on 06/16/2017 -Oncology has already evaluated the patient -Speech therapy will continue to follow -Tracheal aspirate showed abundant Pseudomonas aeruginosa  -Plan is to possibly start patient on liquids on 06/23/2017 - had transient episode of shortness of breath, may have had a mucous plug vs volume overload : improved after dose of IV lasix given 4/6  Agitation -improved; possibly secondary to pain  -Continue ativan q4h PRN  Chronic periodontitis with  bone loss  -Patient evaluated by dental medicine on 06/09/2017 and deferred dental treatment pending results of laryngectomy  Essential hypertension -Continue labetalol as needed, BP currently controlled  Diabetes mellitus, type II -Continue Lantus, insulin sliding scale, CBG monitoring  COPD -Currently without exacerbation, no wheezing -Stable  Malnutrition of moderate degree -Patient has PEG tube in place and is currently on tube feeds -Clear liquid diet to be started today as per ENT   Subjective: Patient seen and examined at bedside.  He is sleepy, wakes up slightly on calling his name, does not answer much questions.  Objective: Vitals:   06/23/17 1100 06/23/17 1200 06/23/17 1226 06/23/17 1300  BP: 109/70 124/80 124/80 132/84  Pulse: 92 86  (!) 101  Resp: 17 12  20   Temp:  98.3 F (36.8 C)    TempSrc:  Oral    SpO2: 98% 96%  97%  Weight:      Height:        Intake/Output Summary (Last 24 hours) at 06/23/2017 1358 Last data filed at 06/23/2017 1200 Gross per 24 hour  Intake 1960 ml  Output 1325 ml  Net 635 ml   Filed Weights   06/21/17 0402 06/22/17 0438 06/23/17 0500  Weight: 78.8 kg (173 lb 11.6 oz) 75.4 kg (166 lb 3.6 oz) 78.3 kg (172 lb 9.9 oz)    Examination:  General exam: Appears calm and comfortable.  No distress.  Chronically ill-appearing Respiratory system: Bilateral decreased breath sound at bases; scattered crackles Cardiovascular system: S1 & S2 heard, rate controlled  gastrointestinal system: Abdomen is nondistended, soft and nontender. Normal bowel sounds heard. Extremities: No cyanosis, clubbing, edema  Data Reviewed: I have personally reviewed following labs and imaging studies  CBC: Recent Labs  Lab 06/18/17 0500 06/19/17 0355 06/21/17 0336 06/22/17 0311  WBC 6.2 7.8 6.3 7.3  HGB 9.6* 9.2* 11.0* 10.3*  HCT 30.5* 29.6* 34.5* 33.7*  MCV 86.9 86.5 85.2 83.8  PLT 210 208 242 381   Basic Metabolic Panel: Recent Labs  Lab  06/18/17 0500 06/19/17 0355 06/20/17 0305 06/21/17 0336 06/22/17 0311  NA 138 139 139 135 136  K 4.0 4.7 4.4 4.9 4.7  CL 98* 99* 96* 95* 96*  CO2 32 31 33* 30 32  GLUCOSE 178* 198* 230* 247* 183*  BUN 20 21* 13 10 12   CREATININE 1.23 1.19 1.04 0.99 0.95  CALCIUM 8.2* 8.2* 8.7* 8.7* 9.2   GFR: Estimated Creatinine Clearance: 78.3 mL/min (by C-G formula based on SCr of 0.95 mg/dL). Liver Function Tests: No results for input(s): AST, ALT, ALKPHOS, BILITOT, PROT, ALBUMIN in the last 168 hours. No results for input(s): LIPASE, AMYLASE in the last 168 hours. No results for input(s): AMMONIA in the last 168 hours. Coagulation Profile: No results for input(s): INR, PROTIME in the last 168 hours. Cardiac Enzymes: No results for input(s): CKTOTAL, CKMB, CKMBINDEX, TROPONINI in the last 168 hours. BNP (last 3 results) No results for input(s): PROBNP in the last 8760 hours. HbA1C: No results for input(s): HGBA1C in the last 72 hours. CBG: Recent Labs  Lab 06/22/17 2005 06/22/17 2352 06/23/17 0349 06/23/17 0818 06/23/17 1147  GLUCAP 228* 257* 179* 129* 197*   Lipid Profile: No results for input(s): CHOL, HDL, LDLCALC, TRIG, CHOLHDL, LDLDIRECT in the last 72 hours. Thyroid Function Tests: No results for input(s): TSH, T4TOTAL, FREET4, T3FREE, THYROIDAB in the last 72 hours. Anemia Panel: No results for input(s): VITAMINB12, FOLATE, FERRITIN, TIBC, IRON, RETICCTPCT in the last 72 hours. Sepsis Labs: No results for input(s): PROCALCITON, LATICACIDVEN in the last 168 hours.  Recent Results (from the past 240 hour(s))  Culture, respiratory (NON-Expectorated)     Status: None   Collection Time: 06/14/17  3:30 PM  Result Value Ref Range Status   Specimen Description   Final    TRACHEAL ASPIRATE Performed at [facility] [address], [place], [place] [address]    Special Requests   Final    NONE Performed at [facility], [place] [address], [place], [place] [address]    Gram Stain   Final    ABUNDANT WBC PRESENT,BOTH PMN AND MONONUCLEAR MODERATE GRAM VARIABLE ROD FEW GRAM POSITIVE COCCI Performed at [facility], [place] [address] [place], [place] [address]    Culture ABUNDANT PSEUDOMONAS AERUGINOSA  Final   Report Status 06/17/2017 FINAL  Final   Organism ID, Bacteria PSEUDOMONAS AERUGINOSA  Final      Susceptibility   Pseudomonas aeruginosa - MIC*    CEFTAZIDIME 4 SENSITIVE Sensitive     CIPROFLOXACIN <=0.25 SENSITIVE Sensitive     GENTAMICIN 2 SENSITIVE Sensitive     IMIPENEM 1 SENSITIVE Sensitive     PIP/TAZO 8 SENSITIVE Sensitive     CEFEPIME 2 SENSITIVE Sensitive     * ABUNDANT PSEUDOMONAS AERUGINOSA  Surgical pcr screen     Status: None   Collection Time: 06/15/17  5:15 PM  Result Value Ref Range Status   MRSA, PCR NEGATIVE NEGATIVE Final   Staphylococcus aureus NEGATIVE NEGATIVE Final    Comment: (NOTE) The Xpert SA Assay (FDA approved for NASAL specimens in patients 4 years of age  and older), is one component of a comprehensive surveillance program. It is not intended to diagnose infection nor to guide or monitor treatment. Performed at [facility], [place] [address], [place],  [address]          Radiology Studies: No results found.      Scheduled Meds: . docusate  100 mg Per Tube BID  . feeding supplement (JEVITY 1.5 CAL/FIBER)  355 mL Per Tube QID  . feeding supplement (PRO-STAT SUGAR FREE 64)  30 mL Per Tube Daily  . haloperidol lactate  2 mg Intravenous Once  . insulin aspart  0-15 Units Subcutaneous Q4H  . insulin glargine  15 Units Subcutaneous QHS  . mouth rinse  15 mL Mouth Rinse QID  . pantoprazole sodium  40 mg Per Tube Daily  . sennosides  5 mL Per Tube QHS   Continuous Infusions:   LOS: 24 days        Aline August, MD [facility] Pager [phone]  If 7PM-7AM, please contact night-coverage [email or web address] Password [email or web address] 06/23/2017, 1:58 PM

## 2017-06-23 NOTE — Progress Notes (Signed)
Patient ID: Caroline Matters, male   DOB: 1945/09/30, 72 y.o.   MRN: [id number] Subjective: Very sleepy on rounds today.  Objective: Vital signs in last 24 hours: Temp:  [97.5 F (36.4 C)-98.8 F (37.1 C)] 98.5 F (36.9 C) (04/10 0800) Pulse Rate:  [44-110] 109 (04/10 1000) Resp:  [0-34] 24 (04/10 1000) BP: (101-154)/(70-106) 145/106 (04/10 1000) SpO2:  [90 %-100 %] 95 % (04/10 1000) FiO2 (%):  [28 %] 28 % (04/10 0831) Weight:  [78.3 kg (172 lb 9.9 oz)] 78.3 kg (172 lb 9.9 oz) (04/10 0500) Weight change: 2.9 kg (6 lb 6.3 oz) Last BM Date: 06/21/17  Intake/Output from previous day: 04/09 0701 - 04/10 0700 In: 1960 [NG/GT:1960] Out: 1500 [Urine:1500] Intake/Output this shift: No intake/output data recorded.  PHYSICAL EXAM: Skin and stoma look healthy.  I remove the tube and cleaned it of some dried up secretions and replaced it.  I had him drink some water and he had no difficulty swallowing there is no signs of a leak.  Lab Results: Recent Labs    06/21/17 0336 06/22/17 0311  WBC 6.3 7.3  HGB 11.0* 10.3*  HCT 34.5* 33.7*  PLT 242 273   BMET Recent Labs    06/21/17 0336 06/22/17 0311  NA 135 136  K 4.9 4.7  CL 95* 96*  CO2 30 32  GLUCOSE 247* 183*  BUN 10 12  CREATININE 0.99 0.95  CALCIUM 8.7* 9.2    Studies/Results: No results found.  Medications: I have reviewed the patient's current medications.  Assessment/Plan: Stable postop, day 7, start liquid diet.  Start discharge planning for nursing facility placement.  LOS: 24 days   [name] 06/23/2017, 11:29 AM

## 2017-06-24 LAB — GLUCOSE, CAPILLARY
GLUCOSE-CAPILLARY: 148 mg/dL — AB (ref 65–99)
Glucose-Capillary: 194 mg/dL — ABNORMAL HIGH (ref 65–99)
Glucose-Capillary: 366 mg/dL — ABNORMAL HIGH (ref 65–99)
Glucose-Capillary: 400 mg/dL — ABNORMAL HIGH (ref 65–99)
Glucose-Capillary: 172 mg/dL — ABNORMAL HIGH (ref 65–99)
Glucose-Capillary: 92 mg/dL (ref 65–99)

## 2017-06-24 LAB — GLUCOSE, RANDOM: GLUCOSE: 406 mg/dL — AB (ref 65–99)

## 2017-06-24 MED ORDER — INSULIN ASPART 100 UNIT/ML ~~LOC~~ SOLN
5.0000 [IU] | Freq: Three times a day (TID) | SUBCUTANEOUS | Status: DC
  Administered 2017-06-25 – 2017-06-27 (×6): 5 [IU] via SUBCUTANEOUS

## 2017-06-24 MED ORDER — HALOPERIDOL LACTATE 5 MG/ML IJ SOLN
2.0000 mg | Freq: Once | INTRAMUSCULAR | Status: AC
  Administered 2017-06-24: 2 mg via INTRAVENOUS

## 2017-06-24 MED ORDER — LORAZEPAM 2 MG/ML IJ SOLN
1.0000 mg | Freq: Once | INTRAMUSCULAR | Status: AC
  Administered 2017-06-24: 1 mg via INTRAVENOUS

## 2017-06-24 MED ORDER — INSULIN ASPART 100 UNIT/ML ~~LOC~~ SOLN
16.0000 [IU] | Freq: Once | SUBCUTANEOUS | Status: AC
  Administered 2017-06-24: 16 [IU] via SUBCUTANEOUS

## 2017-06-24 MED ORDER — INSULIN GLARGINE 100 UNIT/ML ~~LOC~~ SOLN
20.0000 [IU] | Freq: Every day | SUBCUTANEOUS | Status: DC
  Administered 2017-06-24: 20 [IU] via SUBCUTANEOUS
  Filled 2017-06-24: qty 0.2

## 2017-06-24 NOTE — Progress Notes (Signed)
Patient ID: Vincent Hansen, male   DOB: April 28, 1945, 72 y.o.   MRN: [id number] Subjective: No new complaints.  He has been tolerating liquids very nicely.  He has been requiring suctioning of thick mucosal secretions in the airway couple of times each day.  Objective: Vital signs in last 24 hours: Temp:  [97.7 F (36.5 C)-97.8 F (36.6 C)] 97.7 F (36.5 C) (04/11 0821) Pulse Rate:  [69-110] 109 (04/11 1200) Resp:  [12-39] 23 (04/11 1200) BP: (93-186)/(61-108) 144/87 (04/11 1200) SpO2:  [53 %-100 %] 100 % (04/11 1200) FiO2 (%):  [28 %] 28 % (04/11 0748) Weight:  [77 kg (169 lb 12.1 oz)] 77 kg (169 lb 12.1 oz) (04/11 0500) Weight change: -1.3 kg (-2 lb 13.9 oz) Last BM Date: 06/24/17  Intake/Output from previous day: 04/10 0701 - 04/11 0700 In: 1760 [NG/GT:950] Out: 1275 [Urine:1275] Intake/Output this shift: Total I/O In: 980 [P.O.:120; Other:30; NG/GT:830] Out: 650 [Urine:650]  PHYSICAL EXAM: He is awake, and gentle restraints.  The neck skin looks excellent.  There is no swelling or signs of infection.  Stoma is suctioned of mucoid secretions.  The tube was cleaned out as well.  Lab Results: Recent Labs    06/22/17 0311  WBC 7.3  HGB 10.3*  HCT 33.7*  PLT 273   BMET Recent Labs    06/22/17 0311  NA 136  K 4.7  CL 96*  CO2 32  GLUCOSE 183*  BUN 12  CREATININE 0.95  CALCIUM 9.2    Studies/Results: No results found.  Medications: I have reviewed the patient's current medications.  Assessment/Plan: Stable, postop day 8, doing well with liquid diet.  Continue liquid diet.  Transfer to progressive care.  Continue airway suctioning and monitoring.  LOS: 25 days   [name] 06/24/2017, 12:19 PM

## 2017-06-24 NOTE — Progress Notes (Signed)
Patient is requiring saline suction at least once a shift due to the development of mucous plugs. RT has been passing it along to the RN and the nightshift RT.

## 2017-06-24 NOTE — Progress Notes (Signed)
Physical Therapy Treatment Patient Details Name: Vincent Hansen MRN: [id number] DOB: 05-23-1945 Today's Date: 06/24/2017    History of Present Illness 72 year old with PMHx: HTN, COPD, DM, prior history of squamous cell carcinoma of the larynx admitted with hypoxia and found to have large laryngeal mass obstructing airway status post emergency tracheostomy 05/30/2017 extubation on 05/31/2017. Pt with PEG. Total laryngectomy 4/3    PT Comments    Pt with increased ability to mobilize and follow commands today. Pt tolerating RA for all activity with SpO2 100%. Pt unable to utilize RW appropriately despite cues and assist for safety and sequence. Continue to recommend chair follow as pt impulsive and does not clearly communicate fatigue consistently.     Follow Up Recommendations  SNF;Supervision for mobility/OOB     Equipment Recommendations  Rolling walker with 5" wheels    Recommendations for Other Services       Precautions / Restrictions Precautions Precautions: Fall Precaution Comments: trach Restrictions Weight Bearing Restrictions: No    Mobility  Bed Mobility Overal bed mobility: Needs Assistance Bed Mobility: Supine to Sit     Supine to sit: Min assist     General bed mobility comments: cues for sequence with LUE assist to fully elevate trunk and tactile cues to move legs to EOB  Transfers Overall transfer level: Needs assistance   Transfers: Sit to/from Stand Sit to Stand: Min assist;+2 safety/equipment         General transfer comment: min assist to rise with cues for hand placement and assist for lines  Ambulation/Gait Ambulation/Gait assistance: Min assist;+2 safety/equipment Ambulation Distance (Feet): 150 Feet Assistive device: Rolling walker (2 wheeled) Gait Pattern/deviations: Step-through pattern;Decreased stride length;Trunk flexed   Gait velocity interpretation: Below normal speed for age/gender General Gait Details: pt with flexed trunk and  pushing RW too anterior throughout gait despite assist and cues for stepping into RW. chair to follow with pt able to increase distance today but needs assist and cues not to enter other pt rooms. SpO2 100% on RA throughout   Stairs            Wheelchair Mobility    Modified Rankin (Stroke Patients Only)       Balance Overall balance assessment: Needs assistance   Sitting balance-Leahy Scale: Good       Standing balance-Leahy Scale: Poor                              Cognition Arousal/Alertness: Awake/alert Behavior During Therapy: Flat affect Overall Cognitive Status: Difficult to assess                                 General Comments: pt mouthing words at times but unable to read lips. Pt able to communicate need for bathroom      Exercises General Exercises - Lower Extremity Long Arc Quad: AROM;15 reps;Seated;Both Hip Flexion/Marching: AROM;15 reps;Seated;Both    General Comments        Pertinent Vitals/Pain Pain Assessment: No/denies pain    Home Living                      Prior Function            PT Goals (current goals can now be found in the care plan section) Progress towards PT goals: Progressing toward goals    Frequency    Min 2X/week  PT Plan Current plan remains appropriate    Co-evaluation              AM-PAC PT "6 Clicks" Daily Activity  Outcome Measure  Difficulty turning over in bed (including adjusting bedclothes, sheets and blankets)?: A Lot Difficulty moving from lying on back to sitting on the side of the bed? : Unable Difficulty sitting down on and standing up from a chair with arms (e.g., wheelchair, bedside commode, etc,.)?: Unable Help needed moving to and from a bed to chair (including a wheelchair)?: A Lot Help needed walking in hospital room?: A Lot Help needed climbing 3-5 steps with a railing? : A Lot 6 Click Score: 10    End of Session Equipment Utilized  During Treatment: Gait belt Activity Tolerance: Patient tolerated treatment well Patient left: in chair;with call bell/phone within reach;with chair alarm set Nurse Communication: Mobility status;Precautions PT Visit Diagnosis: Other abnormalities of gait and mobility (R26.89);Difficulty in walking, not elsewhere classified (R26.2)     Time: 2993-7169 PT Time Calculation (min) (ACUTE ONLY): 26 min  Charges:  $Gait Training: 8-22 mins $Therapeutic Activity: 8-22 mins                    G Codes:       Elwyn Reach, PT [phone]    [place] 06/24/2017, 10:34 AM

## 2017-06-24 NOTE — Progress Notes (Signed)
Patient ID: Vincent Hansen, male   DOB: 07/18/1945, 72 y.o.   MRN: [id number]  PROGRESS NOTE    Vincent Hansen  KGY:[id number] DOB: 06/28/1945 DOA: 05/30/2017 PCP: Patient, No Pcp Per   Brief Narrative:  72 year old male with history of hypertension, COPD, type 2 diabetes, prior history of squamous cell carcinoma of the larynx was admitted with hypoxia on 05/30/2017 and found to have large laryngeal mass obstructing airway status post emergent tracheostomy placement on 05/30/2017 and subsequent extubation on 05/31/2017.  During the hospitalization, PEG tube was placed for nutrition.  He underwent total laryngectomy on 06/16/2017 by ENT.  He was transferred to ENT service on 06/21/2017.  TRH is following the patient in consult.   Assessment & Plan:   Principal Problem:   Squamous cell carcinoma of larynx (HCC) Active Problems:   Acute respiratory failure with hypoxia (HCC)   Status post tracheostomy (HCC)   Malnutrition of moderate degree   Laryngeal carcinoma (HCC)   DM2 (diabetes mellitus, type 2) (HCC)   HTN (hypertension)   Abnormal CT scan, stomach   Hiatal hernia   Tracheostomy status (HCC)   Periodontitis, chronic   S/P laryngectomy   Post-operative complication   Acute respiratory failure with hypoxia secondary to squamous cell carcinoma of the larynx/severe oropharyngeal dysphagia -s/p tracheostomy-on 05/30/2017 -Patient had PEG tube placed on 06/09/2017 with initiation of feeding -ENT following, status post total laryngectomy on 06/16/2017 -Oncology has already evaluated the patient -Speech therapy will continue to follow; clear liquid diet started on 06/23/2017 as per ENT -Tracheal aspirate showed abundant Pseudomonas aeruginosa  - had transient episode of shortness of breath, may have had a mucous plug vs volume overload : improved after dose of IV lasix given 4/6  Agitation -improved; possibly secondary to pain  -Continue as needed Ativan/Haldol  Chronic periodontitis with  bone loss  -Patient evaluated by dental medicine on 06/09/2017 and deferred dental treatment pending results of laryngectomy  Essential hypertension -Continue labetalol as needed, BP currently controlled  Diabetes mellitus, type II with hyperglycemia -Increase Lantus to 20 units at bedtime; continue insulin sliding scale, CBG monitoring -Add NovoLog with meals if he continues more than 50% of his meals -We will check blood work for a.m.  COPD -Currently without exacerbation, no wheezing -Stable  Malnutrition of moderate degree -Patient has PEG tube in place and is currently on tube feeds -Diet advancement as per ENT   Subjective: Patient seen and examined at bedside.  Patient is sleepy, wakes up very slightly on calling his name, does not answer much questions.  No overnight fever or vomiting. Objective: Vitals:   06/24/17 1029 06/24/17 1100 06/24/17 1200 06/24/17 1300  BP:  136/84 (!) 144/87 101/61  Pulse:  (!) 110 (!) 109 95  Resp:  (!) 21 (!) 23 19  Temp:      TempSrc:      SpO2: 100% 98% 100% 98%  Weight:      Height:        Intake/Output Summary (Last 24 hours) at 06/24/2017 1417 Last data filed at 06/24/2017 1200 Gross per 24 hour  Intake 1820 ml  Output 1400 ml  Net 420 ml   Filed Weights   06/22/17 0438 06/23/17 0500 06/24/17 0500  Weight: 75.4 kg (166 lb 3.6 oz) 78.3 kg (172 lb 9.9 oz) 77 kg (169 lb 12.1 oz)    Examination:  General exam: Appears calm and comfortable.  No distress.  Chronically ill-appearing Respiratory system: Bilateral decreased breath sounds at bases with  some scattered crackles Cardiovascular system: S1 & S2 heard, rate controlled  gastrointestinal system: Abdomen is nondistended, soft and nontender. Normal bowel sounds heard. Extremities: No cyanosis, edema     Data Reviewed: I have personally reviewed following labs and imaging studies  CBC: Recent Labs  Lab 06/18/17 0500 06/19/17 0355 06/21/17 0336 06/22/17 0311  WBC  6.2 7.8 6.3 7.3  HGB 9.6* 9.2* 11.0* 10.3*  HCT 30.5* 29.6* 34.5* 33.7*  MCV 86.9 86.5 85.2 83.8  PLT 210 208 242 196   Basic Metabolic Panel: Recent Labs  Lab 06/18/17 0500 06/19/17 0355 06/20/17 0305 06/21/17 0336 06/22/17 0311  NA 138 139 139 135 136  K 4.0 4.7 4.4 4.9 4.7  CL 98* 99* 96* 95* 96*  CO2 32 31 33* 30 32  GLUCOSE 178* 198* 230* 247* 183*  BUN 20 21* 13 10 12   CREATININE 1.23 1.19 1.04 0.99 0.95  CALCIUM 8.2* 8.2* 8.7* 8.7* 9.2   GFR: Estimated Creatinine Clearance: 77.7 mL/min (by C-G formula based on SCr of 0.95 mg/dL). Liver Function Tests: No results for input(s): AST, ALT, ALKPHOS, BILITOT, PROT, ALBUMIN in the last 168 hours. No results for input(s): LIPASE, AMYLASE in the last 168 hours. No results for input(s): AMMONIA in the last 168 hours. Coagulation Profile: No results for input(s): INR, PROTIME in the last 168 hours. Cardiac Enzymes: No results for input(s): CKTOTAL, CKMB, CKMBINDEX, TROPONINI in the last 168 hours. BNP (last 3 results) No results for input(s): PROBNP in the last 8760 hours. HbA1C: No results for input(s): HGBA1C in the last 72 hours. CBG: Recent Labs  Lab 06/23/17 2326 06/24/17 0356 06/24/17 0819 06/24/17 1319 06/24/17 1320  GLUCAP 299* 148* 194* 366* 400*   Lipid Profile: No results for input(s): CHOL, HDL, LDLCALC, TRIG, CHOLHDL, LDLDIRECT in the last 72 hours. Thyroid Function Tests: No results for input(s): TSH, T4TOTAL, FREET4, T3FREE, THYROIDAB in the last 72 hours. Anemia Panel: No results for input(s): VITAMINB12, FOLATE, FERRITIN, TIBC, IRON, RETICCTPCT in the last 72 hours. Sepsis Labs: No results for input(s): PROCALCITON, LATICACIDVEN in the last 168 hours.  Recent Results (from the past 240 hour(s))  Culture, respiratory (NON-Expectorated)     Status: None   Collection Time: 06/14/17  3:30 PM  Result Value Ref Range Status   Specimen Description   Final    TRACHEAL ASPIRATE Performed at [facility] [address] [place], [place] [address]    Special Requests   Final    NONE Performed at [facility], [place] [address], [place], [place] [address]    Gram Stain   Final    ABUNDANT WBC PRESENT,BOTH PMN AND MONONUCLEAR MODERATE GRAM VARIABLE ROD FEW GRAM POSITIVE COCCI Performed at [facility], [place] [address], [place], [place] [address]    Culture ABUNDANT PSEUDOMONAS AERUGINOSA  Final   Report Status 06/17/2017 FINAL  Final   Organism ID, Bacteria PSEUDOMONAS AERUGINOSA  Final      Susceptibility   Pseudomonas aeruginosa - MIC*    CEFTAZIDIME 4 SENSITIVE Sensitive     CIPROFLOXACIN <=0.25 SENSITIVE Sensitive     GENTAMICIN 2 SENSITIVE Sensitive     IMIPENEM 1 SENSITIVE Sensitive     PIP/TAZO 8 SENSITIVE Sensitive     CEFEPIME 2 SENSITIVE Sensitive     * ABUNDANT PSEUDOMONAS AERUGINOSA  Surgical pcr screen     Status: None   Collection Time: 06/15/17  5:15 PM  Result Value Ref Range Status  MRSA, PCR NEGATIVE NEGATIVE Final   Staphylococcus aureus NEGATIVE NEGATIVE Final    Comment: (NOTE) The Xpert SA Assay (FDA approved for NASAL specimens in patients 58 years of age and older), is one component of a comprehensive surveillance program. It is not intended to diagnose infection nor to guide or monitor treatment. Performed at [facility], [place] [address], [place], [place] [address]          Radiology Studies: No results found.      Scheduled Meds: . docusate  100 mg Per Tube BID  . feeding supplement (JEVITY 1.5 CAL/FIBER)  355 mL Per Tube QID  . feeding supplement (PRO-STAT SUGAR FREE 64)  30 mL Per Tube Daily  . haloperidol lactate  2 mg Intravenous Once  . insulin aspart  0-15 Units Subcutaneous Q4H  . insulin glargine  15 Units Subcutaneous QHS  . mouth rinse  15 mL Mouth Rinse QID  . pantoprazole sodium  40 mg Per Tube Daily  . sennosides  5 mL Per Tube QHS   Continuous Infusions:   LOS: 25  days        Aline August, MD [facility] Pager [phone]  If 7PM-7AM, please contact night-coverage [email or web address] Password [email or web address] 06/24/2017, 2:17 PM

## 2017-06-24 NOTE — Progress Notes (Signed)
Nutrition Follow-up  DOCUMENTATION CODES:   Non-severe (moderate) malnutrition in context of chronic illness  INTERVENTION:   - Continue bolus TF via PEG:  1.5 cans Jevity 1.5 (355 ml) QID  30 ml Prostat daily  60 ml free water before and after each bolus feeding  Provides: 2230 kcal, 105 grams protein, and 1080 ml free water Total free water: 1860 ml  - RD to continue to monitor for oral nutrition supplement needs as diet is advanced  NUTRITION DIAGNOSIS:   Moderate Malnutrition related to chronic illness(COPD, Type 2 DM) as evidenced by moderate muscle depletion, moderate fat depletion.  Ongoing  GOAL:   Patient will meet greater than or equal to 90% of their needs  Met with TF  MONITOR:   TF tolerance, Labs, Weight trends, Skin  REASON FOR ASSESSMENT:   Consult Enteral/tube feeding initiation and management  ASSESSMENT:   Pt with PMH of HTN, COPD, type 2 DM, prior hx of squamous cell ca of larynx admitted with hypoxia and found to have large laryngeal mass obstructing airway s/p emergent trach 3/17. Total laryngectomy 4/3.   05/30/08 - trach placed 06/09/17 - PEG placed 06/10/17 - TF started but advanced slowly. Due to order confusion, TF rate advanced to 40 ml/hr but had not advanced to 60 ml/hr until 4/5 at 1200 06/16/17 - total laryngectomy 06/22/17 - removed trach and replaced with laryngectomy tube 06/23/17 - clear liquids per ENT  Discussed pt during interdisciplinary ICU rounds and with RN.  Pt sleeping at time of visit. Per RN, pt is tolerating TF well. Pt with no complaints about nausea, vomiting, or diarrhea. Per RN, pt had formed BM this morning and tolerated breakfast of clear liquids well. Per rounds, pt is waiting for transfer to progressive care.  Meal Completion: 25% on 06/24/17  I/O's: +8.5 L since 06/10/17  Medications reviewed and include: 100 mg colace BID, sliding scale novolog, 15 units lantus, 40 mg Protonix daily, senokot  Labs  reviewed. CBG's: 194, 148, 299, 243, 281 x 24 hours  Weight has increased over admission (peaking on 06/19/17) but has now decreased back to admission weight.  Diet Order:  Diet clear liquid Room service appropriate? Yes; Fluid consistency: Thin  EDUCATION NEEDS:   No education needs have been identified at this time  Skin:  Skin Assessment: Skin Integrity Issues: Skin Integrity Issues:: Incisions Incisions: s/p PEG placement (abdomen)  Last BM:  06/24/17 medium type 4  Height:   Ht Readings from Last 1 Encounters:  06/10/17 6' (1.829 m)    Weight:   Wt Readings from Last 1 Encounters:  06/24/17 169 lb 12.1 oz (77 kg)    Ideal Body Weight:  80.91 kg  BMI:  Body mass index is 23.02 kg/m.  Estimated Nutritional Needs:   Kcal:  2100-2300 kcal (~30-33 kcal/kg)  Protein:  105-115 grams (20% kcal)  Fluid:  >=2.1 L/day    Gaynell Face, MS, RD, LDN Pager: [phone] Weekend/After Hours: [phone]

## 2017-06-24 NOTE — Progress Notes (Signed)
Inpatient Diabetes Program Recommendations  AACE/ADA: New Consensus Statement on Inpatient Glycemic Control (2015)  Target Ranges:  Prepandial:   less than 140 mg/dL      Peak postprandial:   less than 180 mg/dL (1-2 hours)      Critically ill patients:  140 - 180 mg/dL   Lab Results  Component Value Date   GLUCAP 194 (H) 06/24/2017    Review of Glycemic Control Results for OBERON, HEHIR (MRN [id number]) as of 06/24/2017 12:18  Ref. Range 06/23/2017 19:46 06/23/2017 23:26 06/24/2017 03:56 06/24/2017 08:19  Glucose-Capillary Latest Ref Range: 65 - 99 mg/dL 243 (H) 299 (H) 148 (H) 194 (H)    Diabetes history: Type 2 DM Outpatient Diabetes medications: none  Current orders for Inpatient glycemic control: Novolog 0-15 units Q4H, Lantus 15 units QHS    Inpatient Diabetes Program Recommendations:    While on tube feeds, consider starting Novolog 3 units Q4H for tube coverage.   No A1C noted, may want to consider repeating A1C?  Thanks, Bronson Curb, MSN, RNC-OB Diabetes Coordinator [phone] (8a-5p)

## 2017-06-25 ENCOUNTER — Inpatient Hospital Stay (HOSPITAL_COMMUNITY): Payer: Non-veteran care

## 2017-06-25 DIAGNOSIS — R451 Restlessness and agitation: Secondary | ICD-10-CM

## 2017-06-25 LAB — CBC WITH DIFFERENTIAL/PLATELET
BASOS ABS: 0 10*3/uL (ref 0.0–0.1)
Basophils Relative: 0 %
EOS ABS: 0.1 10*3/uL (ref 0.0–0.7)
EOS PCT: 2 %
HCT: 33.7 % — ABNORMAL LOW (ref 39.0–52.0)
Hemoglobin: 10.9 g/dL — ABNORMAL LOW (ref 13.0–17.0)
Lymphocytes Relative: 9 %
Lymphs Abs: 0.6 10*3/uL — ABNORMAL LOW (ref 0.7–4.0)
MCH: 26.3 pg (ref 26.0–34.0)
MCHC: 32.3 g/dL (ref 30.0–36.0)
MCV: 81.4 fL (ref 78.0–100.0)
Monocytes Absolute: 0.5 10*3/uL (ref 0.1–1.0)
Monocytes Relative: 8 %
NEUTROS PCT: 81 %
Neutro Abs: 5.1 10*3/uL (ref 1.7–7.7)
PLATELETS: 312 10*3/uL (ref 150–400)
RBC: 4.14 MIL/uL — AB (ref 4.22–5.81)
RDW: 13.2 % (ref 11.5–15.5)
WBC: 6.3 10*3/uL (ref 4.0–10.5)

## 2017-06-25 LAB — GLUCOSE, CAPILLARY
GLUCOSE-CAPILLARY: 151 mg/dL — AB (ref 65–99)
GLUCOSE-CAPILLARY: 179 mg/dL — AB (ref 65–99)
GLUCOSE-CAPILLARY: 258 mg/dL — AB (ref 65–99)
GLUCOSE-CAPILLARY: 259 mg/dL — AB (ref 65–99)
GLUCOSE-CAPILLARY: 289 mg/dL — AB (ref 65–99)
GLUCOSE-CAPILLARY: 70 mg/dL (ref 65–99)

## 2017-06-25 LAB — BASIC METABOLIC PANEL
ANION GAP: 10 (ref 5–15)
BUN: 11 mg/dL (ref 6–20)
CO2: 29 mmol/L (ref 22–32)
Calcium: 9.3 mg/dL (ref 8.9–10.3)
Chloride: 99 mmol/L — ABNORMAL LOW (ref 101–111)
Creatinine, Ser: 0.95 mg/dL (ref 0.61–1.24)
GFR calc Af Amer: >60 mL/min (ref >60)
Glucose, Bld: 181 mg/dL — ABNORMAL HIGH (ref 65–99)
POTASSIUM: 4.4 mmol/L (ref 3.5–5.1)
SODIUM: 138 mmol/L (ref 135–145)

## 2017-06-25 LAB — HEMOGLOBIN A1C
HEMOGLOBIN A1C: 7.7 % — AB (ref 4.8–5.6)
MEAN PLASMA GLUCOSE: 174.29 mg/dL

## 2017-06-25 MED ORDER — INSULIN GLARGINE 100 UNIT/ML ~~LOC~~ SOLN
25.0000 [IU] | Freq: Every day | SUBCUTANEOUS | Status: DC
  Administered 2017-06-25: 25 [IU] via SUBCUTANEOUS
  Filled 2017-06-25: qty 0.25

## 2017-06-25 MED ORDER — HALOPERIDOL LACTATE 5 MG/ML IJ SOLN
2.0000 mg | Freq: Four times a day (QID) | INTRAMUSCULAR | Status: DC | PRN
  Administered 2017-06-25: 2 mg via INTRAVENOUS
  Administered 2017-06-26: 5 mg via INTRAVENOUS
  Administered 2017-06-26 (×2): 2.5 mg via INTRAVENOUS
  Administered 2017-06-27: 5 mg via INTRAVENOUS
  Administered 2017-06-30: 2 mg via INTRAVENOUS
  Administered 2017-06-30: 5 mg via INTRAVENOUS
  Filled 2017-06-25 (×5): qty 1

## 2017-06-25 NOTE — Social Work (Addendum)
CSW acknowledging pt arrival back to 6N, will follow to support disposition needs. At this time SNF is recommended. [facility] fellow CS[facility] called [facility] to discuss [facility], no call back at this time. Pt only has [employer] but to my understanding is not service connected.   Pt still in restraints, not able to place at this time.   Alexander Mt, [place] Work [phone]

## 2017-06-25 NOTE — Progress Notes (Signed)
Pt still agitated and restless, trying to get out of bed and pulling off his IV and oxygen. Pt can not be redirected. PRN meds given. Will monitor pt.

## 2017-06-25 NOTE — Progress Notes (Signed)
Patient ID: Vincent Hansen, male   DOB: January 15, 1946, 72 y.o.   MRN: [id number] Subjective: No complaints, he continues to tolerate clear liquids well.  He continues to have combativeness and to require Haldol.  Objective: Vital signs in last 24 hours: Temp:  [97.5 F (36.4 C)-98 F (36.7 C)] 97.5 F (36.4 C) (04/12 0436) Pulse Rate:  [83-114] 99 (04/12 0450) Resp:  [17-26] 18 (04/12 0450) BP: (101-162)/(61-95) 143/83 (04/12 0436) SpO2:  [95 %-100 %] 100 % (04/12 0450) FiO2 (%):  [28 %] 28 % (04/12 0450) Weight:  [77 kg (169 lb 12.1 oz)] 77 kg (169 lb 12.1 oz) (04/12 0500) Weight change: 0 kg (0 lb) Last BM Date: 06/24/17  Intake/Output from previous day: 04/11 0701 - 04/12 0700 In: 1850 [P.O.:140; NG/GT:1600] Out: 2100 [Urine:2100] Intake/Output this shift: No intake/output data recorded.  PHYSICAL EXAM: Stoma inspected and small scabs cleaned out easily with forceps.  Otherwise healing nicely.  Neck incision is excellent.  No swelling.  No signs of fistula formation or infection.  Lab Results: No results for input(s): WBC, HGB, HCT, PLT in the last 72 hours. BMET Recent Labs    06/24/17 1346  GLUCOSE 406*    Studies/Results: No results found.  Medications: I have reviewed the patient's current medications.  Assessment/Plan: Postop day 8, total laryngectomy, progressing nicely from a surgical standpoint.  Awaiting placement prior to discharge.  Will advance to full liquids today.  LOS: 26 days   [name] 06/25/2017, 9:38 AM

## 2017-06-25 NOTE — Progress Notes (Signed)
RT NOTE:  Pt pulled out laryngectomy tube just before RT arrived. Pt was stable. Laryngectomy tube cleaned and replaced. Pt suctioned, Sats 100% on 28% ATC

## 2017-06-25 NOTE — Progress Notes (Signed)
Patient tried to get OOB several times during the day shift despite of his restraint.  He had mittens on Bilateral hands, and noted to have taken them between his legs and pull them a couple of time, each time patient was placed back into his mitten. During the day, the Pt when in the room by him self, was able to remove the mittens and remove his trach collars several times, pull out the cuff to the stoma, pull of condom cath, continuous pulse ox from his ear and try to get OOB - this occurred multiple times during the day, despite the 2 incidence of Haldol   that was given to the patient.  Reeducation was given to the Patient, through the day of why he was in restraints, and why the mittens and the importance of the trach collar, stoma and Pt needing to be safe.   Food was provided to the Pt, through the Peg-tube as well as Clear liquids and then full liquids at dinner time.  Multiple staff members were in the room through out the day to distract the patient, and provide comfort to the patient.

## 2017-06-25 NOTE — Progress Notes (Signed)
Patient ID: Vincent Hansen, male   DOB: February 06, 1946, 72 y.o.   MRN: [id number]  PROGRESS NOTE    Gurshaan Matsuoka  YIF:[id number] DOB: 1946/01/30 DOA: 05/30/2017 PCP: Patient, No Pcp Per    Brief Narrative:  72 year old male with history of hypertension, COPD, type 2 diabetes, prior history of squamous cell carcinoma of the larynx was admitted with hypoxia on 05/30/2017 and found to have large laryngeal mass obstructing airway status post emergent tracheostomy placement on 05/30/2017 and subsequent extubation on 05/31/2017.  During the hospitalization, PEG tube was placed for nutrition.  He underwent total laryngectomy on 06/16/2017 by ENT.  He was transferred to ENT service on 06/21/2017.  TRH is following the patient in consult.   Assessment & Plan:   Principal Problem:   Squamous cell carcinoma of larynx (HCC) Active Problems:   Acute respiratory failure with hypoxia (HCC)   Status post tracheostomy (HCC)   Malnutrition of moderate degree   Laryngeal carcinoma (HCC)   DM2 (diabetes mellitus, type 2) (HCC)   HTN (hypertension)   Abnormal CT scan, stomach   Hiatal hernia   Tracheostomy status (HCC)   Periodontitis, chronic   S/P laryngectomy   Post-operative complication  Agitation -Still having intermittent issues with agitation.  He was extremely agitated yesterday and required Ativan and Haldol. -Continue as needed Ativan/Haldol -Will rule out medical causes of agitation and delirium including blood cultures, urinalysis and cultures, chest x-ray. -If agitation worsens, recommend psychiatry evaluation  Acute respiratory failure with hypoxia secondary to squamous cell carcinoma of the larynx/severe oropharyngeal dysphagia -s/p tracheostomy-on 05/30/2017 -Patient had PEG tube placed on 06/09/2017 with initiation of feeding -ENT following, status post total laryngectomy on 06/16/2017 -Oncology has already evaluated the patient -Speech therapy will continue to follow; -Diet advancement as  per ENT -Tracheal aspirate showed abundant Pseudomonas aeruginosa  - had transient episode of shortness of breath, may have had a mucous plug vs volume overload : improved after dose of IV lasix given 4/6   Chronic periodontitis with bone loss  -Patient evaluated by dental medicine on 06/09/2017 and deferred dental treatment pending results of laryngectomy  Essential hypertension -Continue labetalol as needed, BP currently controlled  Diabetes mellitus, type II with hyperglycemia -Increase Lantus to 25 units at bedtime; continue insulin sliding scale, CBG monitoring -Continue NovoLog with meals if he consumes more than 50% of his meals  COPD -Currently without exacerbation, no wheezing -Stable  Malnutrition of moderate degree -Patient has PEG tube in place and is currently on tube feeds -Diet advancement as per ENT   Subjective: Patient seen and examined at bedside.  Patient is currently calm, sleepy and hardly wakes up.  Nursing staff reports agitation yesterday and overnight. Objective: Vitals:   06/25/17 0450 06/25/17 0500 06/25/17 0945 06/25/17 1253  BP:      Pulse: 99  94   Resp: 18  18   Temp:      TempSrc:      SpO2: 100%  100% 99%  Weight:  77 kg (169 lb 12.1 oz)    Height:        Intake/Output Summary (Last 24 hours) at 06/25/2017 1408 Last data filed at 06/25/2017 0959 Gross per 24 hour  Intake 840 ml  Output 2350 ml  Net -1510 ml   Filed Weights   06/23/17 0500 06/24/17 0500 06/25/17 0500  Weight: 78.3 kg (172 lb 9.9 oz) 77 kg (169 lb 12.1 oz) 77 kg (169 lb 12.1 oz)    Examination:  General exam:  No distress currently.  Chronically ill-appearing.  Sleeping, hardly wakes up. Respiratory system: Bilateral decreased breath sounds at bases with some scattered crackles Cardiovascular system: S1-S2 heard, rate controlled  gastrointestinal system: Abdomen is nondistended, soft and nontender. Normal bowel sounds heard. Extremities: No cyanosis, edema      Data Reviewed: I have personally reviewed following labs and imaging studies  CBC: Recent Labs  Lab 06/19/17 0355 06/21/17 0336 06/22/17 0311 06/25/17 0832  WBC 7.8 6.3 7.3 6.3  NEUTROABS  --   --   --  5.1  HGB 9.2* 11.0* 10.3* 10.9*  HCT 29.6* 34.5* 33.7* 33.7*  MCV 86.5 85.2 83.8 81.4  PLT 208 242 273 790   Basic Metabolic Panel: Recent Labs  Lab 06/19/17 0355 06/20/17 0305 06/21/17 0336 06/22/17 0311 06/24/17 1346 06/25/17 0832  NA 139 139 135 136  --  138  K 4.7 4.4 4.9 4.7  --  4.4  CL 99* 96* 95* 96*  --  99*  CO2 31 33* 30 32  --  29  GLUCOSE 198* 230* 247* 183* 406* 181*  BUN 21* 13 10 12   --  11  CREATININE 1.19 1.04 0.99 0.95  --  0.95  CALCIUM 8.2* 8.7* 8.7* 9.2  --  9.3   GFR: Estimated Creatinine Clearance: 77.7 mL/min (by C-G formula based on SCr of 0.95 mg/dL). Liver Function Tests: No results for input(s): AST, ALT, ALKPHOS, BILITOT, PROT, ALBUMIN in the last 168 hours. No results for input(s): LIPASE, AMYLASE in the last 168 hours. No results for input(s): AMMONIA in the last 168 hours. Coagulation Profile: No results for input(s): INR, PROTIME in the last 168 hours. Cardiac Enzymes: No results for input(s): CKTOTAL, CKMB, CKMBINDEX, TROPONINI in the last 168 hours. BNP (last 3 results) No results for input(s): PROBNP in the last 8760 hours. HbA1C: Recent Labs    06/25/17 0832  HGBA1C 7.7*   CBG: Recent Labs  Lab 06/24/17 1950 06/25/17 0034 06/25/17 0432 06/25/17 0733 06/25/17 1157  GLUCAP 92 259* 179* 151* 258*   Lipid Profile: No results for input(s): CHOL, HDL, LDLCALC, TRIG, CHOLHDL, LDLDIRECT in the last 72 hours. Thyroid Function Tests: No results for input(s): TSH, T4TOTAL, FREET4, T3FREE, THYROIDAB in the last 72 hours. Anemia Panel: No results for input(s): VITAMINB12, FOLATE, FERRITIN, TIBC, IRON, RETICCTPCT in the last 72 hours. Sepsis Labs: No results for input(s): PROCALCITON, LATICACIDVEN in the last 168  hours.  Recent Results (from the past 240 hour(s))  Surgical pcr screen     Status: None   Collection Time: 06/15/17  5:15 PM  Result Value Ref Range Status   MRSA, PCR NEGATIVE NEGATIVE Final   Staphylococcus aureus NEGATIVE NEGATIVE Final    Comment: (NOTE) The Xpert SA Assay (FDA approved for NASAL specimens in patients 6 years of age and older), is one component of a comprehensive surveillance program. It is not intended to diagnose infection nor to guide or monitor treatment. Performed at [facility], [place] [address], [place], [place] [address]          Radiology Studies: No results found.      Scheduled Meds: . docusate  100 mg Per Tube BID  . feeding supplement (JEVITY 1.5 CAL/FIBER)  355 mL Per Tube QID  . feeding supplement (PRO-STAT SUGAR FREE 64)  30 mL Per Tube Daily  . haloperidol lactate  2 mg Intravenous Once  . insulin aspart  0-15 Units Subcutaneous Q4H  . insulin aspart  5 Units  Subcutaneous TID WC  . insulin glargine  20 Units Subcutaneous QHS  . mouth rinse  15 mL Mouth Rinse QID  . pantoprazole sodium  40 mg Per Tube Daily  . sennosides  5 mL Per Tube QHS   Continuous Infusions:   LOS: 26 days        Aline August, MD [facility] Pager [phone]  If 7PM-7AM, please contact night-coverage [email or web address] Password [facility] 06/25/2017, 2:08 PM

## 2017-06-26 LAB — GLUCOSE, CAPILLARY
GLUCOSE-CAPILLARY: 144 mg/dL — AB (ref 65–99)
GLUCOSE-CAPILLARY: 241 mg/dL — AB (ref 65–99)
Glucose-Capillary: 212 mg/dL — ABNORMAL HIGH (ref 65–99)
Glucose-Capillary: 220 mg/dL — ABNORMAL HIGH (ref 65–99)
Glucose-Capillary: 99 mg/dL (ref 65–99)

## 2017-06-26 MED ORDER — INSULIN GLARGINE 100 UNIT/ML ~~LOC~~ SOLN
20.0000 [IU] | Freq: Every day | SUBCUTANEOUS | Status: DC
  Administered 2017-06-26 – 2017-07-05 (×9): 20 [IU] via SUBCUTANEOUS
  Filled 2017-06-26 (×12): qty 0.2

## 2017-06-26 NOTE — Progress Notes (Signed)
   ENT Progress Note: POD #9 s/p Procedure(s): TOTAL LARYNGECTOMY   Subjective: Stable airway Cont disorientation  Objective: Vital signs in last 24 hours: Temp:  [97.9 F (36.6 C)-98.8 F (37.1 C)] 97.9 F (36.6 C) (04/13 0522) Pulse Rate:  [103-118] 103 (04/13 0900) Resp:  [20-25] 20 (04/13 0900) BP: (130-157)/(89-96) 157/96 (04/13 0522) SpO2:  [96 %-100 %] 99 % (04/13 0900) FiO2 (%):  [28 %-30 %] 30 % (04/13 0900) Weight:  [77.4 kg (170 lb 10.2 oz)] 77.4 kg (170 lb 10.2 oz) (04/13 0500) Weight change: 0.4 kg (14.1 oz) Last BM Date: 06/24/17  Intake/Output from previous day: 04/12 0701 - 04/13 0700 In: 455 [NG/GT:455] Out: 3100 [Urine:3100] Intake/Output this shift: No intake/output data recorded.  Labs: Recent Labs    06/25/17 0832  WBC 6.3  HGB 10.9*  HCT 33.7*  PLT 312   Recent Labs    06/24/17 1346 06/25/17 0832  NA  --  138  K  --  4.4  CL  --  99*  CO2  --  29  GLUCOSE 406* 181*  BUN  --  11  CALCIUM  --  9.3    Studies/Results: Dg Chest Port 1 View  Result Date: 06/25/2017 CLINICAL DATA:  Dyspnea EXAM: PORTABLE CHEST 1 VIEW COMPARISON:  06/19/2017, 06/09/2017 FINDINGS: Mildly low lung volumes. No focal airspace disease or pleural effusion. Improved aeration at the bases. Normal heart size. Aortic atherosclerosis. No pneumothorax. IMPRESSION: Low lung volumes. Improved aeration at the bases with minimal atelectasis at the left base. Electronically Signed   By: Donavan Foil M.D.   On: 06/25/2017 16:22     PHYSICAL EXAM: Healing laryngectomy - stable Stoma intact No leak or swelling   Assessment/Plan: Stable - monitor cont healing Cont liquid only feeding Cont restraints and monitoring Appreciate medical svc help    Claudia Greenley 06/26/2017, 10:15 AM

## 2017-06-26 NOTE — Progress Notes (Signed)
Patient ID: Vincent Hansen, male   DOB: Nov 11, 1945, 72 y.o.   MRN: [id number]  PROGRESS NOTE    Vincent Hansen  IWL:[id number] DOB: [birth year] DOA: 05/30/2017 PCP: Patient, No Pcp Per    Brief Narrative:  72 year old male with history of hypertension, COPD, type 2 diabetes, prior history of squamous cell carcinoma of the larynx was admitted with hypoxia on 05/30/2017 and found to have large laryngeal mass obstructing airway status post emergent tracheostomy placement on 05/30/2017 and subsequent extubation on 05/31/2017.  During the hospitalization, PEG tube was placed for nutrition.  He underwent total laryngectomy on 06/16/2017 by ENT.  He was transferred to ENT service on 06/21/2017.  TRH is following the patient in consult.   Assessment & Plan:   Principal Problem:   Squamous cell carcinoma of larynx (HCC) Active Problems:   Acute respiratory failure with hypoxia (HCC)   Status post tracheostomy (HCC)   Malnutrition of moderate degree   Laryngeal carcinoma (HCC)   DM2 (diabetes mellitus, type 2) (HCC)   HTN (hypertension)   Abnormal CT scan, stomach   Hiatal hernia   Tracheostomy status (HCC)   Periodontitis, chronic   S/P laryngectomy   Post-operative complication  Agitation -Continue as needed Ativan/Haldol -If agitation worsens, recommend psychiatry evaluation -No signs of infection: Chest x-ray on 06/25/2017 was negative for infiltrates.  Blood cultures pending so far.  No fevers.  Acute respiratory failure with hypoxia secondary to squamous cell carcinoma of the larynx/severe oropharyngeal dysphagia -s/p tracheostomy-on 05/30/2017 -Patient had PEG tube placed on 06/09/2017 with initiation of feeding -ENT following, status post total laryngectomy on 06/16/2017 -Oncology has already evaluated the patient: Outpatient follow-up -Speech therapy will continue to follow -Diet advancement as per ENT -Tracheal aspirate showed abundant Pseudomonas aeruginosa  - had transient episode of  shortness of breath, may have had a mucous plug vs volume overload : improved after dose of IV lasix given 4/6   Chronic periodontitis with bone loss  -Patient evaluated by dental medicine on 06/09/2017 and deferred dental treatment pending results of laryngectomy  Essential hypertension -Continue labetalol as needed, BP currently controlled  Diabetes mellitus, type II with hyperglycemia -Some episodes of hypoglycemia -Decrease Lantus to 20 units at bedtime; continue insulin sliding scale, CBG monitoring -Continue NovoLog with meals if he consumes more than 50% of his meals  COPD -Currently without exacerbation, no wheezing -Stable  Malnutrition of moderate degree -Patient has PEG tube in place and is currently on tube feeds -Diet advancement as per ENT   Subjective: Patient seen and examined at bedside.  Patient is awake but hardly answers any questions.  No overnight fever or vomiting.  Was intermittently agitated overnight as per nursing staff. Objective: Vitals:   06/26/17 0431 06/26/17 0500 06/26/17 0522 06/26/17 0900  BP:   (!) 157/96   Pulse: (!) 117  (!) 118 (!) 103  Resp: (!) 22  (!) 22 20  Temp:   97.9 F (36.6 C)   TempSrc:   Axillary   SpO2: 99%  96% 99%  Weight:  77.4 kg (170 lb 10.2 oz)    Height:        Intake/Output Summary (Last 24 hours) at 06/26/2017 1123 Last data filed at 06/26/2017 0527 Gross per 24 hour  Intake 455 ml  Output 2200 ml  Net -1745 ml   Filed Weights   06/24/17 0500 06/25/17 0500 06/26/17 0500  Weight: 77 kg (169 lb 12.1 oz) 77 kg (169 lb 12.1 oz) 77.4 kg (170 lb 10.2  oz)    Examination:  General exam: No distress currently.  Chronically ill-appearing.  Awake but hardly answers any questions Respiratory system: Bilateral decreased breath sounds at bases  cardiovascular system: S1-S2 heard, rate controlled  gastrointestinal system: Abdomen is nondistended, soft and nontender. Normal bowel sounds heard. Extremities: No  cyanosis, edema     Data Reviewed: I have personally reviewed following labs and imaging studies  CBC: Recent Labs  Lab 06/21/17 0336 06/22/17 0311 06/25/17 0832  WBC 6.3 7.3 6.3  NEUTROABS  --   --  5.1  HGB 11.0* 10.3* 10.9*  HCT 34.5* 33.7* 33.7*  MCV 85.2 83.8 81.4  PLT 242 273 536   Basic Metabolic Panel: Recent Labs  Lab 06/20/17 0305 06/21/17 0336 06/22/17 0311 06/24/17 1346 06/25/17 0832  NA 139 135 136  --  138  K 4.4 4.9 4.7  --  4.4  CL 96* 95* 96*  --  99*  CO2 33* 30 32  --  29  GLUCOSE 230* 247* 183* 406* 181*  BUN 13 10 12   --  11  CREATININE 1.04 0.99 0.95  --  0.95  CALCIUM 8.7* 8.7* 9.2  --  9.3   GFR: Estimated Creatinine Clearance: 78.1 mL/min (by C-G formula based on SCr of 0.95 mg/dL). Liver Function Tests: No results for input(s): AST, ALT, ALKPHOS, BILITOT, PROT, ALBUMIN in the last 168 hours. No results for input(s): LIPASE, AMYLASE in the last 168 hours. No results for input(s): AMMONIA in the last 168 hours. Coagulation Profile: No results for input(s): INR, PROTIME in the last 168 hours. Cardiac Enzymes: No results for input(s): CKTOTAL, CKMB, CKMBINDEX, TROPONINI in the last 168 hours. BNP (last 3 results) No results for input(s): PROBNP in the last 8760 hours. HbA1C: Recent Labs    06/25/17 0832  HGBA1C 7.7*   CBG: Recent Labs  Lab 06/25/17 1638 06/25/17 2015 06/26/17 0022 06/26/17 0420 06/26/17 0751  GLUCAP 70 289* 212* 99 144*   Lipid Profile: No results for input(s): CHOL, HDL, LDLCALC, TRIG, CHOLHDL, LDLDIRECT in the last 72 hours. Thyroid Function Tests: No results for input(s): TSH, T4TOTAL, FREET4, T3FREE, THYROIDAB in the last 72 hours. Anemia Panel: No results for input(s): VITAMINB12, FOLATE, FERRITIN, TIBC, IRON, RETICCTPCT in the last 72 hours. Sepsis Labs: No results for input(s): PROCALCITON, LATICACIDVEN in the last 168 hours.  No results found for this or any previous visit (from the past 240  hour(s)).       Radiology Studies: Dg Chest Port 1 View  Result Date: 06/25/2017 CLINICAL DATA:  Dyspnea EXAM: PORTABLE CHEST 1 VIEW COMPARISON:  06/19/2017, 06/09/2017 FINDINGS: Mildly low lung volumes. No focal airspace disease or pleural effusion. Improved aeration at the bases. Normal heart size. Aortic atherosclerosis. No pneumothorax. IMPRESSION: Low lung volumes. Improved aeration at the bases with minimal atelectasis at the left base. Electronically Signed   By: Donavan Foil M.D.   On: 06/25/2017 16:22        Scheduled Meds: . docusate  100 mg Per Tube BID  . feeding supplement (JEVITY 1.5 CAL/FIBER)  355 mL Per Tube QID  . feeding supplement (PRO-STAT SUGAR FREE 64)  30 mL Per Tube Daily  . haloperidol lactate  2 mg Intravenous Once  . insulin aspart  0-15 Units Subcutaneous Q4H  . insulin aspart  5 Units Subcutaneous TID WC  . insulin glargine  25 Units Subcutaneous QHS  . mouth rinse  15 mL Mouth Rinse QID  . pantoprazole sodium  40  mg Per Tube Daily  . sennosides  5 mL Per Tube QHS   Continuous Infusions:   LOS: 27 days        Aline August, MD [facility] Pager [phone]  If 7PM-7AM, please contact night-coverage [email or web address] Password [facility] 06/26/2017, 11:23 AM

## 2017-06-26 NOTE — Progress Notes (Signed)
Pt out of room.

## 2017-06-27 DIAGNOSIS — Z008 Encounter for other general examination: Secondary | ICD-10-CM

## 2017-06-27 LAB — GLUCOSE, CAPILLARY
GLUCOSE-CAPILLARY: 85 mg/dL (ref 65–99)
Glucose-Capillary: 209 mg/dL — ABNORMAL HIGH (ref 65–99)
Glucose-Capillary: 216 mg/dL — ABNORMAL HIGH (ref 65–99)

## 2017-06-27 MED ORDER — LIDOCAINE HCL (CARDIAC) 20 MG/ML IV SOLN
INTRAVENOUS | Status: AC
  Filled 2017-06-27: qty 5

## 2017-06-27 MED ORDER — IBUPROFEN 100 MG/5ML PO SUSP
400.0000 mg | Freq: Three times a day (TID) | ORAL | Status: DC | PRN
  Filled 2017-06-27: qty 20

## 2017-06-27 MED ORDER — LIDOCAINE HCL (PF) 1 % IJ SOLN
INTRAMUSCULAR | Status: AC
  Filled 2017-06-27: qty 30

## 2017-06-27 NOTE — Progress Notes (Signed)
   ENT Progress Note: POD #10 s/p Procedure(s): TOTAL LARYNGECTOMY   Subjective: No complaints  Objective: Vital signs in last 24 hours: Temp:  [98.1 F (36.7 C)-98.8 F (37.1 C)] 98.8 F (37.1 C) (04/14 9937) Pulse Rate:  [88-115] 90 (04/14 0921) Resp:  [20-24] 20 (04/14 0921) BP: (122-146)/(80-85) 144/80 (04/14 0613) SpO2:  [98 %-100 %] 99 % (04/14 0921) FiO2 (%):  [28 %-30 %] 28 % (04/14 0921) Weight:  [72.3 kg (159 lb 6.3 oz)] 72.3 kg (159 lb 6.3 oz) (04/14 1696) Weight change: -5.1 kg (-11 lb 3.9 oz) Last BM Date: 06/26/17  Intake/Output from previous day: 04/13 0701 - 04/14 0700 In: 355 [NG/GT:355] Out: 450 [Urine:450] Intake/Output this shift: No intake/output data recorded.  Labs: Recent Labs    06/25/17 0832  WBC 6.3  HGB 10.9*  HCT 33.7*  PLT 312   Recent Labs    06/24/17 1346 06/25/17 0832  NA  --  138  K  --  4.4  CL  --  99*  CO2  --  29  GLUCOSE 406* 181*  BUN  --  11  CALCIUM  --  9.3    Studies/Results: Dg Chest Port 1 View  Result Date: 06/25/2017 CLINICAL DATA:  Dyspnea EXAM: PORTABLE CHEST 1 VIEW COMPARISON:  06/19/2017, 06/09/2017 FINDINGS: Mildly low lung volumes. No focal airspace disease or pleural effusion. Improved aeration at the bases. Normal heart size. Aortic atherosclerosis. No pneumothorax. IMPRESSION: Low lung volumes. Improved aeration at the bases with minimal atelectasis at the left base. Electronically Signed   By: Donavan Foil M.D.   On: 06/25/2017 16:22     PHYSICAL EXAM: Stoma intact Laryngectomy tube in place No erythema or swelling.   Assessment/Plan: Patient stable 10 days after laryngectomy.  Mental status has improved today.  Continue current therapy.  Patient tolerating liquid diet.  Monitor and continue discharge/care planning.    [place], Thersea [place] 06/27/2017, 9:34 AM

## 2017-06-27 NOTE — Consult Note (Signed)
[facility] Consult   Reason for Consult:  ''extreme agitation'' Referring Physician:  Dr. Starla Link Patient Identification: Vincent Hansen MRN:  [id number] Principal Diagnosis: Squamous cell carcinoma of larynx [facility]) Diagnosis:   Patient Active Problem List   Diagnosis Date Noted  . Periodontitis, chronic [K05.30] 06/16/2017  . S/P laryngectomy [Z90.02] 06/16/2017  . Post-operative complication [Y09.9XXA] 06/16/2017  . Squamous cell carcinoma of larynx ([place]) [C32.9] 06/09/2017  . Tracheostomy status ([place]) [Z93.0]   . Abnormal CT scan, stomach [R93.3]   . Hiatal hernia [K44.9]   . DM2 (diabetes mellitus, type 2) ([place]) [E11.9] 06/04/2017  . HTN (hypertension) [I10] 06/04/2017  . Larynx cancer ([place]) [C32.9] 06/03/2017  . Laryngeal carcinoma ([place]) [C32.9]   . Malnutrition of moderate degree [E44.0] 06/01/2017  . Acute respiratory failure with hypoxia ([place]) [J96.01]   . Status post tracheostomy ([place]) [Z93.0]     Total Time spent with patient: 45 minutes  Subjective:   Vincent Hansen is a 72 y.o. male patient admitted with hypoxia  HPI:  Patient with history of  hypertension, COPD, type 2 diabetes, prior history of squamous cell carcinoma of the larynx was admitted with hypoxia on 05/30/2017 and found to have large laryngeal mass obstructing airway,  status post emergent tracheostomy placement on 05/30/2017 and subsequent extubation on 05/31/2017. He underwent total laryngectomy on 06/16/2017 by ENT. Patient is non verbal but able to communicate by writing. He denies prior history of mental illness and was surprised  that he is talking to a psychiatrist. Patient staff nurse report that patient was agitated yesterday but has calm down a lot since he started get getting Haldol and Lorazepam as needed for agitation. Patient is calm, cooperative, denies psychosis, delusions or suicidal thoughts.  Past Psychiatric History: none reported by patient  Risk to Self: Is patient at risk  for suicide?: No Risk to Others:   Prior Inpatient Therapy:   Prior Outpatient Therapy:    Past Medical History:  Past Medical History:  Diagnosis Date  . Diabetes mellitus without complication ([place])   . Hypertension     Past Surgical History:  Procedure Laterality Date  . ESOPHAGOGASTRODUODENOSCOPY (EGD) WITH PROPOFOL N/A 06/07/2017   Procedure: ESOPHAGOGASTRODUODENOSCOPY (EGD) WITH PROPOFOL;  Surgeon: Milus Banister, MD;  Location: [facility];  Service: Endoscopy;  Laterality: N/A;  . HERNIA REPAIR    . IR GASTROSTOMY TUBE MOD SED  06/09/2017  . LARYNGETOMY N/A 06/16/2017   Procedure: TOTAL LARYNGECTOMY;  Surgeon: [name], MD;  Location: [facility];  Service: ENT;  Laterality: N/A;  . TRACHEOSTOMY TUBE PLACEMENT N/A 05/30/2017   Procedure: TRACHEOSTOMY, LARYNGEAL MASS BIOPSY;  Surgeon: [name], MD;  Location: [facility];  Service: ENT;  Laterality: N/A;   Family History:  Family History  Problem Relation Age of Onset  . Cancer Neg Hx    Family Psychiatric  History:  Social History:  Social History   Substance and Sexual Activity  Alcohol Use No     Social History   Substance and Sexual Activity  Drug Use Never    Social History   Socioeconomic History  . Marital status: Single    Spouse name: Not on file  . Number of children: Not on file  . Years of education: Not on file  . Highest education level: Not on file  Occupational History  . Not on file  Social Needs  . Financial resource strain: Not on file  . Food insecurity:    Worry: Not on file  Inability: Not on file  . Transportation needs:    Medical: Not on file    Non-medical: Not on file  Tobacco Use  . Smoking status: Former Research scientist (life sciences)  . Smokeless tobacco: Never Used  Substance and Sexual Activity  . Alcohol use: No  . Drug use: Never  . Sexual activity: Not on file  Lifestyle  . Physical activity:    Days per week: Not on file    Minutes per session: Not on file  . Stress: Not on file   Relationships  . Social connections:    Talks on phone: Not on file    Gets together: Not on file    Attends religious service: Not on file    Active member of club or organization: Not on file    Attends meetings of clubs or organizations: Not on file    Relationship status: Not on file  Other Topics Concern  . Not on file  Social History Narrative  . Not on file   Additional Social History:    Allergies:  No Known Allergies  Labs:  Results for orders placed or performed during the hospital encounter of 05/30/17 (from the past 48 hour(s))  Culture, blood (routine x 2)     Status: None (Preliminary result)   Collection Time: 06/25/17  2:40 PM  Result Value Ref Range   Specimen Description BLOOD RIGHT ANTECUBITAL    Special Requests      BOTTLES DRAWN AEROBIC AND ANAEROBIC Blood Culture adequate volume   Culture      NO GROWTH 2 DAYS Performed at [facility] [address] [place], [place] [address]    Report Status PENDING   Culture, blood (routine x 2)     Status: None (Preliminary result)   Collection Time: 06/25/17  2:40 PM  Result Value Ref Range   Specimen Description BLOOD RIGHT ANTECUBITAL    Special Requests      BOTTLES DRAWN AEROBIC AND ANAEROBIC Blood Culture adequate volume   Culture      NO GROWTH 2 DAYS Performed at [facility], [place] [address] [place], [place] [address]    Report Status PENDING   Glucose, capillary     Status: None   Collection Time: 06/25/17  4:38 PM  Result Value Ref Range   Glucose-Capillary 70 65 - 99 mg/dL  Glucose, capillary     Status: Abnormal   Collection Time: 06/25/17  8:15 PM  Result Value Ref Range   Glucose-Capillary 289 (H) 65 - 99 mg/dL   Comment 1 Notify RN    Comment 2 Document in Chart   Glucose, capillary     Status: Abnormal   Collection Time: 06/26/17 12:22 AM  Result Value Ref Range   Glucose-Capillary 212 (H) 65 - 99 mg/dL   Comment 1 Notify RN    Comment 2 Document in Chart   Glucose,  capillary     Status: None   Collection Time: 06/26/17  4:20 AM  Result Value Ref Range   Glucose-Capillary 99 65 - 99 mg/dL   Comment 1 Notify RN    Comment 2 Document in Chart   Glucose, capillary     Status: Abnormal   Collection Time: 06/26/17  7:51 AM  Result Value Ref Range   Glucose-Capillary 144 (H) 65 - 99 mg/dL  Glucose, capillary     Status: Abnormal   Collection Time: 06/26/17 12:13 PM  Result Value Ref Range   Glucose-Capillary 241 (H) 65 -  99 mg/dL  Glucose, capillary     Status: Abnormal   Collection Time: 06/26/17  5:18 PM  Result Value Ref Range   Glucose-Capillary 220 (H) 65 - 99 mg/dL  Glucose, capillary     Status: None   Collection Time: 06/27/17  7:52 AM  Result Value Ref Range   Glucose-Capillary 85 65 - 99 mg/dL  Glucose, capillary     Status: Abnormal   Collection Time: 06/27/17 12:03 PM  Result Value Ref Range   Glucose-Capillary 209 (H) 65 - 99 mg/dL    Current Facility-Administered Medications  Medication Dose Route Frequency Provider Last Rate Last Dose  . acetaminophen (TYLENOL) solution 650 mg  650 mg Per Tube Q6H PRN Cristal Ford, DO      . bisacodyl (DULCOLAX) suppository 10 mg  10 mg Rectal Daily PRN Cristal Ford, DO      . docusate (COLACE) 50 MG/5ML liquid 100 mg  100 mg Per Tube BID Cristal Ford, DO   100 mg at 06/27/17 1220  . feeding supplement (JEVITY 1.5 CAL/FIBER) liquid 355 mL  355 mL Per Tube QID Cristal Ford, DO   355 mL at 06/27/17 1223  . feeding supplement (PRO-STAT SUGAR FREE 64) liquid 30 mL  30 mL Per Tube Daily Mikhail, Maryann, DO   30 mL at 06/27/17 1020  . haloperidol lactate (HALDOL) injection 2 mg  2 mg Intravenous Once Cristal Ford, DO      . haloperidol lactate (HALDOL) injection 2-5 mg  2-5 mg Intravenous Q6H PRN Aline August, MD   5 mg at 06/26/17 2239  . HYDROcodone-acetaminophen (HYCET) 7.5-325 mg/15 ml solution 10-15 mL  10-15 mL Per Tube Q4H PRN Cristal Ford, DO   15 mL at 06/27/17 1220  .  ibuprofen (ADVIL,MOTRIN) 100 MG/5ML suspension 400 mg  400 mg Oral Q8H PRN Jerrell Belfast, MD      . insulin aspart (novoLOG) injection 0-15 Units  0-15 Units Subcutaneous Q4H Cristal Ford, DO   5 Units at 06/27/17 1223  . insulin aspart (novoLOG) injection 5 Units  5 Units Subcutaneous TID WC Aline August, MD   5 Units at 06/26/17 1731  . insulin glargine (LANTUS) injection 20 Units  20 Units Subcutaneous QHS Aline August, MD   20 Units at 06/26/17 2238  . ipratropium-albuterol (DUONEB) 0.5-2.5 (3) MG/3ML nebulizer solution 3 mL  3 mL Nebulization Q4H PRN Alekh, Kshitiz, MD      . labetalol (NORMODYNE,TRANDATE) injection 5-10 mg  5-10 mg Intravenous Q2H PRN Cristal Ford, DO   10 mg at 06/21/17 0956  . lip balm (BLISTEX) ointment   Topical PRN Cristal Ford, DO      . LORazepam (ATIVAN) injection 1 mg  1 mg Intravenous Q4H PRN Cristal Ford, DO   1 mg at 06/25/17 2142  . MEDLINE mouth rinse  15 mL Mouth Rinse QID Cristal Ford, DO   15 mL at 06/27/17 1224  . ondansetron (ZOFRAN) injection 4 mg  4 mg Intravenous Q6H PRN Cristal Ford, DO   4 mg at 06/18/17 1229  . pantoprazole sodium (PROTONIX) 40 mg/20 mL oral suspension 40 mg  40 mg Per Tube Daily Mikhail, Velta Addison, DO   40 mg at 06/27/17 1040  . sennosides (SENOKOT) 8.8 MG/5ML syrup 5 mL  5 mL Per Tube QHS Mikhail, Velta Addison, DO   5 mL at 06/25/17 2306  . traZODone (DESYREL) tablet 50 mg  50 mg Per Tube QHS PRN Cristal Ford, DO   50 mg at 06/25/17 [phone]  Musculoskeletal: Strength & Muscle Tone: not tested Gait & Station: unable to stand Patient leans: N/A  Psychiatric Specialty Exam: Physical Exam  Psychiatric: He has a normal mood and affect. His behavior is normal. Judgment and thought content normal. Cognition and memory are normal. He is noncommunicative.    Review of Systems  Unable to perform ROS: Other    Blood pressure 134/89, pulse 86, temperature 97.9 F (36.6 C), temperature source Axillary, resp.  rate 20, height 6' (1.829 m), weight 72.3 kg (159 lb 6.3 oz), SpO2 97 %.Body mass index is 21.62 kg/m.  General Appearance: Casual  Eye Contact:  Good  Speech:  non-communicative  Volume:  non-communicative  Mood:  Anxious  Affect:  Congruent  Thought Process:  Linear  Orientation:  Full (Time, Place, and Person)  Thought Content:  Logical  Suicidal Thoughts:  No  Homicidal Thoughts:  No  Memory:  Immediate;   Fair Recent;   Fair Remote;   Fair  Judgement:  Intact  Insight:  Fair  Psychomotor Activity:  Restlessness  Concentration:  Concentration: Fair and Attention Span: Fair  Recall:  [employer] of Knowledge:  Fair  Language:  non-communicative  Akathisia:  No  Handed:  Right  AIMS (if indicated):     Assets:  Desire for Improvement  ADL's:  Intact  Cognition:  WNL  Sleep:   fair     Treatment Plan Summary: 72 year old man who denies prior history of mental illness. However, patient was extremely agitated yesterday but he is calm and cooperative today following administration of Haldol and Ativan as needed. Cause of agitation unknown based on my evaluation today.  Plan/Recommendation: -May continue Haldol 2 mg every 6 hrs as needed for agitation. -Caution with Lorazepam use in patient taking Opioid for pain to avoid delirium/excessive drowsiness.  Disposition: Psychiatric service signing out. Re-consult as needed.  Corena Pilgrim, MD 06/27/2017 2:14 PM

## 2017-06-27 NOTE — Code Documentation (Addendum)
Responded to Code Blue. Upon arrival, patient had a pulse, per staff, patient pulled his laryngeal tube out. Staff had placed a replacement trach and it was confirmed with ETCO2 detector. Per was coughing persistently and was uncomfortable, appeared in distress but sats were 99% on TC 28%. SBP 130-140s, HR 90-100s.   ENT MD updated by myself over the phone, MD came to bedside, trach was removed and laryngeal tube was place back in. Patient was given 1cc of 2% Lidocaine and patient tolerated procedure well. Patient was repositioned and he was able to rest and fell asleep.   Of note patient has had periods of delirium and impulsive behavior per RN and RT staff.  I requested that the RN paged the primary medicine service and update them on the situation. Under the circumstances, patient might need to be restrained (chemically or physically) giving that he has pulled the larygneal tube multiple times and he currently needs this to help manage his secretions.   Call Type: Code Blue  Start Time 2138 End Time 2240

## 2017-06-27 NOTE — Progress Notes (Signed)
Patient ID: Vincent Hansen, male   DOB: 05/25/1945, 72 y.o.   MRN: [id number]  PROGRESS NOTE    Vincent Hansen  WUJ:[id number] DOB: 1945/10/01 DOA: 05/30/2017 PCP: Patient, No Pcp Per    Brief Narrative:  72 year old male with history of hypertension, COPD, type 2 diabetes, prior history of squamous cell carcinoma of the larynx was admitted with hypoxia on 05/30/2017 and found to have large laryngeal mass obstructing airway status post emergent tracheostomy placement on 05/30/2017 and subsequent extubation on 05/31/2017.  During the hospitalization, PEG tube was placed for nutrition.  He underwent total laryngectomy on 06/16/2017 by ENT.  He was transferred to ENT service on 06/21/2017.  TR[facility] is following the patient in consult.   Assessment & Plan:   Principal Problem:   Squamous cell carcinoma of larynx (HCC) Active Problems:   Acute respiratory failure with hypoxia (HCC)   Status post tracheostomy (HCC)   Malnutrition of moderate degree   Laryngeal carcinoma (HCC)   DM2 (diabetes mellitus, type 2) (HCC)   HTN (hypertension)   Abnormal CT scan, stomach   Hiatal hernia   Tracheostomy status (HCC)   Periodontitis, chronic   S/P laryngectomy   Post-operative complication  Agitation -Continue as needed Ativan/Haldol -Still required few doses of Haldol yesterday.  We will consult psychiatry for the same. -No signs of infection: Chest x-ray on 06/25/2017 was negative for infiltrates.  Blood cultures pending so far.  No fevers.  Acute respiratory failure with hypoxia secondary to squamous cell carcinoma of the larynx/severe oropharyngeal dysphagia -s/p tracheostomy-on 05/30/2017 -Patient had PEG tube placed on 06/09/2017 with initiation of feeding -ENT following, status post total laryngectomy on 06/16/2017 -Oncology has already evaluated the patient: Outpatient follow-up -Speech therapy will continue to follow -Diet advancement as per ENT -Tracheal aspirate showed abundant Pseudomonas  aeruginosa  - had transient episode of shortness of breath, may have had a mucous plug vs volume overload : improved after dose of IV lasix given 4/6   Chronic periodontitis with bone loss  -Patient evaluated by dental medicine on 06/09/2017 and deferred dental treatment pending results of laryngectomy  Essential hypertension -Continue labetalol as needed, BP currently controlled  Diabetes mellitus, type II with hyperglycemia -Continue current dose of Lantus, continue insulin sliding scale, CBG monitoring -Continue NovoLog with meals if he consumes more than 50% of his meals  COPD -Currently without exacerbation, no wheezing -Stable  Malnutrition of moderate degree -Patient has PEG tube in place and is currently on tube feeds -Diet advancement as per ENT   Subjective: Patient seen and examined at bedside.  Patient is awake but cannot answer questions.  No overnight fever or vomiting.  Currently more calm.  Objective: Vitals:   06/27/17 0320 06/27/17 0613 06/27/17 0921 06/27/17 1004  BP:  (!) 144/80  134/89  Pulse:  88 90 90  Resp:  20 20 (!) 22  Temp:  98.8 F (37.1 C)  97.9 F (36.6 C)  TempSrc:  Oral  Axillary  SpO2: 100% 99% 99% 100%  Weight:  72.3 kg (159 lb 6.3 oz)    Height:        Intake/Output Summary (Last 24 hours) at 06/27/2017 1050 Last data filed at 06/27/2017 0500 Gross per 24 hour  Intake 355 ml  Output 450 ml  Net -95 ml   Filed Weights   06/25/17 0500 06/26/17 0500 06/27/17 0613  Weight: 77 kg (169 lb 12.1 oz) 77.4 kg (170 lb 10.2 oz) 72.3 kg (159 lb 6.3 oz)  Examination:  General exam: No acute distress.  Chronically ill looking.  Awake and calm  respiratory system: Bilateral decreased breath sounds at bases cardiovascular system: Rate controlled, S1-S2 positive  gastrointestinal system: Abdomen is nondistended, soft and nontender. Normal bowel sounds heard.  PEG tube present Extremities: No cyanosis, edema     Data Reviewed: I have  personally reviewed following labs and imaging studies  CBC: Recent Labs  Lab 06/21/17 0336 06/22/17 0311 06/25/17 0832  WBC 6.3 7.3 6.3  NEUTROABS  --   --  5.1  HGB 11.0* 10.3* 10.9*  HCT 34.5* 33.7* 33.7*  MCV 85.2 83.8 81.4  PLT 242 273 161   Basic Metabolic Panel: Recent Labs  Lab 06/21/17 0336 06/22/17 0311 06/24/17 1346 06/25/17 0832  NA 135 136  --  138  K 4.9 4.7  --  4.4  CL 95* 96*  --  99*  CO2 30 32  --  29  GLUCOSE 247* 183* 406* 181*  BUN 10 12  --  11  CREATININE 0.99 0.95  --  0.95  CALCIUM 8.7* 9.2  --  9.3   GFR: Estimated Creatinine Clearance: 72.9 mL/min (by C-G formula based on SCr of 0.95 mg/dL). Liver Function Tests: No results for input(s): AST, ALT, ALKPHOS, BILITOT, PROT, ALBUMIN in the last 168 hours. No results for input(s): LIPASE, AMYLASE in the last 168 hours. No results for input(s): AMMONIA in the last 168 hours. Coagulation Profile: No results for input(s): INR, PROTIME in the last 168 hours. Cardiac Enzymes: No results for input(s): CKTOTAL, CKMB, CKMBINDEX, TROPONINI in the last 168 hours. BNP (last 3 results) No results for input(s): PROBNP in the last 8760 hours. HbA1C: Recent Labs    06/25/17 0832  HGBA1C 7.7*   CBG: Recent Labs  Lab 06/26/17 0420 06/26/17 0751 06/26/17 1213 06/26/17 1718 06/27/17 0752  GLUCAP 99 144* 241* 220* 85   Lipid Profile: No results for input(s): CHOL, HDL, LDLCALC, TRIG, CHOLHDL, LDLDIRECT in the last 72 hours. Thyroid Function Tests: No results for input(s): TSH, T4TOTAL, FREET4, T3FREE, THYROIDAB in the last 72 hours. Anemia Panel: No results for input(s): VITAMINB12, FOLATE, FERRITIN, TIBC, IRON, RETICCTPCT in the last 72 hours. Sepsis Labs: No results for input(s): PROCALCITON, LATICACIDVEN in the last 168 hours.  Recent Results (from the past 240 hour(s))  Culture, blood (routine x 2)     Status: None (Preliminary result)   Collection Time: 06/25/17  2:40 PM  Result Value Ref  Range Status   Specimen Description BLOOD RIGHT ANTECUBITAL  Final   Special Requests   Final    BOTTLES DRAWN AEROBIC AND ANAEROBIC Blood Culture adequate volume   Culture   Final    NO GROWTH 1 DAY Performed at [facility], [address], [place], [place] [address]    Report Status PENDING  Incomplete  Culture, blood (routine x 2)     Status: None (Preliminary result)   Collection Time: 06/25/17  2:40 PM  Result Value Ref Range Status   Specimen Description BLOOD RIGHT ANTECUBITAL  Final   Special Requests   Final    BOTTLES DRAWN AEROBIC AND ANAEROBIC Blood Culture adequate volume   Culture   Final    NO GROWTH 1 DAY Performed at [facility], [place] [address], [place], [place] [address]    Report Status PENDING  Incomplete         Radiology Studies: Dg Chest Port 1 View  Result Date: 06/25/2017 CLINICAL  DATA:  Dyspnea EXAM: PORTABLE CHEST 1 VIEW COMPARISON:  06/19/2017, 06/09/2017 FINDINGS: Mildly low lung volumes. No focal airspace disease or pleural effusion. Improved aeration at the bases. Normal heart size. Aortic atherosclerosis. No pneumothorax. IMPRESSION: Low lung volumes. Improved aeration at the bases with minimal atelectasis at the left base. Electronically Signed   By: Donavan Foil M.D.   On: 06/25/2017 16:22        Scheduled Meds: . docusate  100 mg Per Tube BID  . feeding supplement (JEVITY 1.5 CAL/FIBER)  355 mL Per Tube QID  . feeding supplement (PRO-STAT SUGAR FREE 64)  30 mL Per Tube Daily  . haloperidol lactate  2 mg Intravenous Once  . insulin aspart  0-15 Units Subcutaneous Q4H  . insulin aspart  5 Units Subcutaneous TID WC  . insulin glargine  20 Units Subcutaneous QHS  . mouth rinse  15 mL Mouth Rinse QID  . pantoprazole sodium  40 mg Per Tube Daily  . sennosides  5 mL Per Tube QHS   Continuous Infusions:   LOS: 28 days        Aline August, MD [facility] Pager [phone]  If 7PM-7AM, please contact  night-coverage [email or web address] Password [email or web address] 06/27/2017, 10:50 AM

## 2017-06-27 NOTE — Progress Notes (Signed)
Initially a CODE BLUE was called. On arrival CPR was NOT in progress. Apparently learned through nursing staff that patient pulled out his trach, and was in respiratory distress. No cyanosis, or oxygen desaturation noted per RN/RRT. Pt was diaphoretic on my arrival with some increase WOB. Trach flanged was placed in by self, and ETCO2 detector was used to confirm placement. Positive color change noted. Lurline Idol was secure after placement was confirmed with new clean trach ties. Pt has a history of carcinoma of the larynx w/ large laryngeal mass obstructing the airway requiring emergent tracheostomy. Noted Laryngectomy tube and trach ties on the floor. Pt is followed by ENT service. Pt is currently stable at this time on cont. pulse oximetry. RN/nursing staff at bedside.

## 2017-06-27 NOTE — Progress Notes (Signed)
Ordering Laryngectomy tube if possible from materials management. MD verbal order. MD at bedside.

## 2017-06-28 LAB — GLUCOSE, CAPILLARY
GLUCOSE-CAPILLARY: 105 mg/dL — AB (ref 65–99)
GLUCOSE-CAPILLARY: 59 mg/dL — AB (ref 65–99)
GLUCOSE-CAPILLARY: 156 mg/dL — AB (ref 65–99)
GLUCOSE-CAPILLARY: 322 mg/dL — AB (ref 65–99)
Glucose-Capillary: 131 mg/dL — ABNORMAL HIGH (ref 65–99)
Glucose-Capillary: 139 mg/dL — ABNORMAL HIGH (ref 65–99)
Glucose-Capillary: 253 mg/dL — ABNORMAL HIGH (ref 65–99)
Glucose-Capillary: 327 mg/dL — ABNORMAL HIGH (ref 65–99)
Glucose-Capillary: 46 mg/dL — ABNORMAL LOW (ref 65–99)

## 2017-06-28 LAB — URINALYSIS, ROUTINE W REFLEX MICROSCOPIC
Bilirubin Urine: NEGATIVE
GLUCOSE, UA: 150 mg/dL — AB
Hgb urine dipstick: NEGATIVE
KETONES UR: NEGATIVE mg/dL
Leukocytes, UA: NEGATIVE
NITRITE: NEGATIVE
PROTEIN: 30 mg/dL — AB
Specific Gravity, Urine: 1.009 (ref 1.005–1.030)
pH: 8 (ref 5.0–8.0)

## 2017-06-28 LAB — MRSA PCR SCREENING: MRSA BY PCR: NEGATIVE

## 2017-06-28 MED ORDER — INSULIN ASPART 100 UNIT/ML ~~LOC~~ SOLN
0.0000 [IU] | SUBCUTANEOUS | Status: DC
  Administered 2017-06-28: 7 [IU] via SUBCUTANEOUS
  Administered 2017-06-28: 1 [IU] via SUBCUTANEOUS
  Administered 2017-06-28: 7 [IU] via SUBCUTANEOUS
  Administered 2017-06-29: 2 [IU] via SUBCUTANEOUS
  Administered 2017-06-29: 5 [IU] via SUBCUTANEOUS
  Administered 2017-06-29: 3 [IU] via SUBCUTANEOUS
  Administered 2017-06-29: 2 [IU] via SUBCUTANEOUS
  Administered 2017-06-30: 5 [IU] via SUBCUTANEOUS
  Administered 2017-06-30 (×3): 2 [IU] via SUBCUTANEOUS
  Administered 2017-06-30: 5 [IU] via SUBCUTANEOUS
  Administered 2017-07-01: 2 [IU] via SUBCUTANEOUS
  Administered 2017-07-01: 1 [IU] via SUBCUTANEOUS
  Administered 2017-07-01: 7 [IU] via SUBCUTANEOUS
  Administered 2017-07-01: 1 [IU] via SUBCUTANEOUS
  Administered 2017-07-01: 2 [IU] via SUBCUTANEOUS
  Administered 2017-07-02 (×3): 3 [IU] via SUBCUTANEOUS
  Administered 2017-07-02: 2 [IU] via SUBCUTANEOUS
  Administered 2017-07-02: 5 [IU] via SUBCUTANEOUS
  Administered 2017-07-03: 1 [IU] via SUBCUTANEOUS
  Administered 2017-07-03: 3 [IU] via SUBCUTANEOUS
  Administered 2017-07-03: 5 [IU] via SUBCUTANEOUS
  Administered 2017-07-04 (×3): 2 [IU] via SUBCUTANEOUS
  Administered 2017-07-04: 1 [IU] via SUBCUTANEOUS
  Administered 2017-07-05 (×2): 2 [IU] via SUBCUTANEOUS
  Administered 2017-07-05: 1 [IU] via SUBCUTANEOUS
  Administered 2017-07-06: 2 [IU] via SUBCUTANEOUS

## 2017-06-28 MED ORDER — MOMETASONE FURO-FORMOTEROL FUM 100-5 MCG/ACT IN AERO
2.0000 | INHALATION_SPRAY | Freq: Two times a day (BID) | RESPIRATORY_TRACT | Status: DC
  Filled 2017-06-28 (×2): qty 8.8

## 2017-06-28 MED ORDER — INSULIN ASPART 100 UNIT/ML ~~LOC~~ SOLN
5.0000 [IU] | SUBCUTANEOUS | Status: DC
  Administered 2017-06-28: 5 [IU] via SUBCUTANEOUS

## 2017-06-28 NOTE — Progress Notes (Signed)
Morning CBG=59. Pt was given orange juice. Repeat CBG=113

## 2017-06-28 NOTE — Progress Notes (Signed)
Left a message with [facility] letting the friend know that the patient transferred to [id number].

## 2017-06-28 NOTE — Progress Notes (Signed)
Pt pulled out laryngeal tube had difficulty breathing. RR called. ENT on call MD came to evaluate pt.

## 2017-06-28 NOTE — Progress Notes (Signed)
Patient ID: Vincent Hansen, male   DOB: [birth year], 72 y.o.   MRN: [id number]  PROGRESS NOTE    Vincent Hansen  XBD:[id number] DOB: [birth year] DOA: 05/30/2017 PCP: Patient, No Pcp Per    Brief Narrative:  72 year old male with history of hypertension, COPD, type 2 diabetes, prior history of squamous cell carcinoma of the larynx was admitted with hypoxia on 05/30/2017 and found to have large laryngeal mass obstructing airway status post emergent tracheostomy placement on 05/30/2017 and subsequent extubation on 05/31/2017.  During the hospitalization, PEG tube was placed for nutrition.  He underwent total laryngectomy on 06/16/2017 by ENT.  He was transferred to ENT service on 06/21/2017.  TRH is following the patient in consult.   Assessment & Plan:   Principal Problem:   Squamous cell carcinoma of larynx (HCC) Active Problems:   Acute respiratory failure with hypoxia (HCC)   Status post tracheostomy (HCC)   Malnutrition of moderate degree   Laryngeal carcinoma (HCC)   DM2 (diabetes mellitus, type 2) (HCC)   HTN (hypertension)   Abnormal CT scan, stomach   Hiatal hernia   Tracheostomy status (HCC)   Periodontitis, chronic   S/P laryngectomy   Post-operative complication  Agitation -Continue as needed Haldol. -Psychiatry evaluation appreciated: Psychiatry has signed off. -No signs of infection: Chest x-ray on 06/25/2017 was negative for infiltrates.  Blood cultures pending so far.  No fevers.  Acute respiratory failure with hypoxia secondary to squamous cell carcinoma of the larynx/severe oropharyngeal dysphagia -s/p tracheostomy-on 05/30/2017 -Patient had PEG tube placed on 06/09/2017 with initiation of feeding -ENT following, status post total laryngectomy on 06/16/2017 -Oncology has already evaluated the patient: Outpatient follow-up -Speech therapy will continue to follow -Diet advancement as per ENT -Tracheal aspirate showed abundant Pseudomonas aeruginosa  - had transient episode of  shortness of breath, may have had a mucous plug vs volume overload : improved after dose of IV lasix given 4/6 -Saturation dropped this morning but improved subsequently after mucous plug was suctioned.   Chronic periodontitis with bone loss  -Patient evaluated by dental medicine on 06/09/2017 and deferred dental treatment pending results of laryngectomy  Essential hypertension -Continue labetalol as needed, BP currently controlled  Diabetes mellitus, type II with hyperglycemia -Still with intermittent hypoglycemia.  Continue Lantus.  Change NovoLog coverage to every 6 hours.  COPD -Currently without exacerbation, no wheezing -Stable.  We will start Dulera  Malnutrition of moderate degree -Patient has PEG tube in place and is currently on tube feeds -Diet advancement as per ENT   Subjective: Patient seen and examined at bedside.  Patient is sleepy, wakes up slightly but cannot answer questions. currently calm. Objective: Vitals:   06/28/17 0323 06/28/17 0518 06/28/17 0913 06/28/17 0917  BP:  (!) 146/89  (!) 145/79  Pulse:  84 88 100  Resp:      Temp:  97.7 F (36.5 C)    TempSrc:  Oral    SpO2: 100% 98% (!) 62% 100%  Weight:      Height:        Intake/Output Summary (Last 24 hours) at 06/28/2017 1311 Last data filed at 06/28/2017 0430 Gross per 24 hour  Intake 415 ml  Output 925 ml  Net -510 ml   Filed Weights   06/25/17 0500 06/26/17 0500 06/27/17 0613  Weight: 77 kg (169 lb 12.1 oz) 77.4 kg (170 lb 10.2 oz) 72.3 kg (159 lb 6.3 oz)    Examination:  General exam: Chronically ill looking.  No acute distress respiratory  system: Bilateral decreased breath sounds at bases with some scattered crackles cardiovascular system: S1-S2 positive, rate controlled gastrointestinal system: Abdomen is nondistended, soft and nontender. Normal bowel sounds heard.  PEG tube present Extremities: No cyanosis, edema     Data Reviewed: I have personally reviewed following labs  and imaging studies  CBC: Recent Labs  Lab 06/22/17 0311 06/25/17 0832  WBC 7.3 6.3  NEUTROABS  --  5.1  HGB 10.3* 10.9*  HCT 33.7* 33.7*  MCV 83.8 81.4  PLT 273 630   Basic Metabolic Panel: Recent Labs  Lab 06/22/17 0311 06/24/17 1346 06/25/17 0832  NA 136  --  138  K 4.7  --  4.4  CL 96*  --  99*  CO2 32  --  29  GLUCOSE 183* 406* 181*  BUN 12  --  11  CREATININE 0.95  --  0.95  CALCIUM 9.2  --  9.3   GFR: Estimated Creatinine Clearance: 72.9 mL/min (by C-G formula based on SCr of 0.95 mg/dL). Liver Function Tests: No results for input(s): AST, ALT, ALKPHOS, BILITOT, PROT, ALBUMIN in the last 168 hours. No results for input(s): LIPASE, AMYLASE in the last 168 hours. No results for input(s): AMMONIA in the last 168 hours. Coagulation Profile: No results for input(s): INR, PROTIME in the last 168 hours. Cardiac Enzymes: No results for input(s): CKTOTAL, CKMB, CKMBINDEX, TROPONINI in the last 168 hours. BNP (last 3 results) No results for input(s): PROBNP in the last 8760 hours. HbA1C: No results for input(s): HGBA1C in the last 72 hours. CBG: Recent Labs  Lab 06/28/17 0045 06/28/17 0431 06/28/17 0514 06/28/17 0824 06/28/17 1224  GLUCAP 253* 59* 131* 156* 327*   Lipid Profile: No results for input(s): CHOL, HDL, LDLCALC, TRIG, CHOLHDL, LDLDIRECT in the last 72 hours. Thyroid Function Tests: No results for input(s): TSH, T4TOTAL, FREET4, T3FREE, THYROIDAB in the last 72 hours. Anemia Panel: No results for input(s): VITAMINB12, FOLATE, FERRITIN, TIBC, IRON, RETICCTPCT in the last 72 hours. Sepsis Labs: No results for input(s): PROCALCITON, LATICACIDVEN in the last 168 hours.  Recent Results (from the past 240 hour(s))  Culture, blood (routine x 2)     Status: None (Preliminary result)   Collection Time: 06/25/17  2:40 PM  Result Value Ref Range Status   Specimen Description BLOOD RIGHT ANTECUBITAL  Final   Special Requests   Final    BOTTLES DRAWN  AEROBIC AND ANAEROBIC Blood Culture adequate volume   Culture   Final    NO GROWTH 2 DAYS Performed at [facility], [address], [place], [place] [address]    Report Status PENDING  Incomplete  Culture, blood (routine x 2)     Status: None (Preliminary result)   Collection Time: 06/25/17  2:40 PM  Result Value Ref Range Status   Specimen Description BLOOD RIGHT ANTECUBITAL  Final   Special Requests   Final    BOTTLES DRAWN AEROBIC AND ANAEROBIC Blood Culture adequate volume   Culture   Final    NO GROWTH 2 DAYS Performed at [facility], [place] [address], [place], [place] [address]    Report Status PENDING  Incomplete         Radiology Studies: No results found.      Scheduled Meds: . docusate  100 mg Per Tube BID  . feeding supplement (JEVITY 1.5 CAL/FIBER)  355 mL Per Tube QID  . feeding supplement (PRO-STAT SUGAR FREE 64)  30 mL Per Tube Daily  .  haloperidol lactate  2 mg Intravenous Once  . insulin aspart  0-15 Units Subcutaneous Q4H  . insulin aspart  5 Units Subcutaneous TID WC  . insulin glargine  20 Units Subcutaneous QHS  . mouth rinse  15 mL Mouth Rinse QID  . pantoprazole sodium  40 mg Per Tube Daily  . sennosides  5 mL Per Tube QHS   Continuous Infusions:   LOS: 29 days        Aline August, MD [facility] Pager [phone]  If 7PM-7AM, please contact night-coverage [email or web address] Password [email or web address] 06/28/2017, 1:11 PM

## 2017-06-28 NOTE — Social Work (Addendum)
This Probation officer received a call from Loma Sender from [place], in regards to referral for pt placement at [facility]. [facility] states that they don't see rehab potential from referral. Floor CSW will f/u with assigned CSW Daisy Floro [phone] x [phone] in regards to referral and why pt was deemed not SNF appropriate.   This Probation officer will share referral documentation sent.  Alexander Mt, [place] Work [phone]

## 2017-06-28 NOTE — Progress Notes (Addendum)
Pt's BP was elevated. On call Family Practice MD was called. Pt was given one time order of hydralazine IV. Day RN to follow up. Error wrong Ptt

## 2017-06-28 NOTE — Code Documentation (Signed)
CODE BLUE NOTE  Patient Name: Vincent Hansen   MRN: [id number]   Date of Birth/ Sex: 10/10/1945 , male      Admission Date: 05/30/2017  Attending Provider: [name], MD  Primary Diagnosis: Squamous cell carcinoma of larynx [facility])    Indication: Pt was in his usual state of health until this AM, when he was noted to be hypoxic in the 60-70s%. He apparently removed trachostomy tube vs had mucus plug.  Code blue was subsequently called. At the time of arrival on scene, ACLS protocol was underway. Patient was suctioned and airway was reestablished. Patient was then satting 100%. BP 150s/70s    Technical Description:  - CPR performance duration:  No  - Was defibrillation or cardioversion used? No   - Was external pacer placed? No  - Was patient intubated pre/post CPR? No    Medications Administered: Y = Yes; Blank = No Amiodarone    Atropine    Calcium    Epinephrine    Lidocaine    Magnesium    Norepinephrine    Phenylephrine    Sodium bicarbonate    Vasopressin      Post CPR evaluation:  - Final Status - Was patient successfully resuscitated ? Yes - What is current rhythm? NSR - What is current hemodynamic status? VSS   Miscellaneous Information:  - Labs sent, including: None  - Primary team notified?  Yes  - Family Notified? Unsure  - Additional notes/ transfer status:         Bonnita Hollow, MD  06/28/2017, 9:25 AM

## 2017-06-28 NOTE — Significant Event (Signed)
Rapid Response Event Note  Overview: Time Called: 0901 Arrival Time: 0907 Event Type: Respiratory  Initial Focused Assessment: Initially called by RN with concern of patient with increased WOB and tachypnea. While en route to patient, code blue initiated. Upon entering room, patient bagged by RN. Patient lethargic and diaphoretic. BP/HR stable. O2 sats 56%.    Interventions: Refer to code blue record. Oxygen added. Patient bagged with 100% fiO2. Repositioned to provide optimal oxygenation. Continuous pulse oximetry. Called MD for transfer orders. Patient to transfer to pulmonary progressive bed when available.  Plan of Care (if not transferred): While awaiting bed: Monitor patient closely. Provide extra staff in room or family support if available. Call as needed for additional support.  Event Summary: Name of Physician Notified: Dr Constance Holster at 0902   Dr. Heber [place], resident notified at [phone] with code blue call  Outcome: Coded and survived  Event End Time: [place]

## 2017-06-28 NOTE — Progress Notes (Signed)
Report called to [place] on 2W.

## 2017-06-28 NOTE — Progress Notes (Signed)
Code Blue -Patient will be moved to 2W and will follow up on them there.   06/28/17 0900  Clinical Encounter Type  Visited With Patient not available  Visit Type Code  Referral From Care management  Consult/Referral To Chaplain

## 2017-06-28 NOTE — Plan of Care (Signed)
  Problem: Clinical Measurements: Goal: Respiratory complications will improve Outcome: Not Progressing   Problem: Clinical Measurements: Goal: Ability to maintain clinical measurements within normal limits will improve Outcome: Not Progressing   

## 2017-06-28 NOTE — Progress Notes (Signed)
Inpatient Diabetes Program Recommendations  AACE/ADA: New Consensus Statement on Inpatient Glycemic Control (2015)  Target Ranges:  Prepandial:   less than 140 mg/dL      Peak postprandial:   less than 180 mg/dL (1-2 hours)      Critically ill patients:  140 - 180 mg/dL   Results for Vincent Hansen, Vincent Hansen (MRN [id number]) as of 06/28/2017 10:32  Ref. Range 06/27/2017 07:52 06/27/2017 12:03 06/27/2017 16:33 06/27/2017 21:35 06/28/2017 00:45 06/28/2017 04:31 06/28/2017 05:14 06/28/2017 08:24  Glucose-Capillary Latest Ref Range: 65 - 99 mg/dL 85 209 (H)  Novolog 5 units 216 (H)  Novolog 10 units (5 TF cov = 5 SSI) 105 (H) 253 (H)  Novolog 8 units 59 (L) 131 (H) 156 (H)   Review of Glycemic Control  Current orders for Inpatient glycemic control: Lantus 20 units QHS, Novolog 0-15 units Q4H, Novolog 5 units TID with meals  Inpatient Diabetes Program Recommendations:  Insulin - Basal: Noted Lantus was NOT GIVEN last night. Therefore, CBGs may be higher today as a result. Correction (SSI): Please consider decreasing Novolog correction to sensitive scale and change frequency to Q6H. Noted glucose 253mg /dl at 00:45 and Novlog 8 units was given at 00:56. The following glucose was 59 mg/dl at 4:31 am.  Insulin -tube feeding overage: Currently ordered Novolog 5 units TID with meals for tube feeding coverage. Since Jevity tube feeding is ordered QID, may want to consider changing frequency of Novolog tube feeding coverage to QID (at same timing of Jevity feedings).  NOTE: Noted Code Blue called this morning around 9:25 am due to respiratory distress and rapid response called last night due to patient pulling out laryngeal tube.   Thanks, Barnie Alderman, RN, MSN, CDE Diabetes Coordinator Inpatient Diabetes Program [phone] (Team Pager from 8am to 5pm)

## 2017-06-28 NOTE — Social Work (Signed)
CSW has faxed referral to [place] committee for further assistance with disposition.   Alexander Mt, [place] Work [phone]

## 2017-06-28 NOTE — Progress Notes (Signed)
Patient ID: Camar Guyton, male   DOB: Oct 11, 1945, 72 y.o.   MRN: [id number] Subjective: No complaints.  He seems a little bit more engaged today.  He wrote down some questions for me.  He asked me how much he owes me and if he is confined to this place.  Objective: Vital signs in last 24 hours: Temp:  [97.7 F (36.5 C)-98 F (36.7 C)] 97.7 F (36.5 C) (04/15 0518) Pulse Rate:  [82-100] 100 (04/15 0917) Resp:  [19-28] 28 (04/14 1802) BP: (137-160)/(79-92) 145/79 (04/15 0917) SpO2:  [62 %-100 %] 100 % (04/15 0917) FiO2 (%):  [28 %] 28 % (04/15 0323) Weight change:  Last BM Date: 06/26/17  Intake/Output from previous day: 04/14 0701 - 04/15 0700 In: 415 [NG/GT:415] Out: 925 [Urine:925] Intake/Output this shift: No intake/output data recorded.  PHYSICAL EXAM: Stoma was cleared of dried secretions and 1 of the sutures was removed.  The tube was replaced.  The neck skin looks excellent.  Lab Results: No results for input(s): WBC, HGB, HCT, PLT in the last 72 hours. BMET No results for input(s): NA, K, CL, CO2, GLUCOSE, BUN, CREATININE, CALCIUM in the last 72 hours.  Studies/Results: No results found.  Medications: I have reviewed the patient's current medications.  Assessment/Plan: Stable postop.  Continue with oral liquids.  Continue to work on potential placement.  Psychiatry was involved but has signed off.  LOS: 29 days   [name] 06/28/2017, 12:44 PM

## 2017-06-28 NOTE — Progress Notes (Signed)
Patient desaturated to 60's. Patient with decreased respirations. Code Blue called. Respiratory suctioned out a mucus plug and patient's  Saturations increased to 90's. Spoke to Dr. Constance Holster. Transfer orders received.

## 2017-06-28 NOTE — Progress Notes (Signed)
Chaplain responded to a Code of this patient, who reportly had pulled out his laryngeal tube out. The  Response team  Stated the patient's medical was stable st this time,there no need for any additional assistance. Chaplain Yaakov Guthrie [phone]

## 2017-06-29 LAB — BASIC METABOLIC PANEL
Anion gap: 8 (ref 5–15)
BUN: 12 mg/dL (ref 6–20)
CO2: 29 mmol/L (ref 22–32)
Calcium: 9.2 mg/dL (ref 8.9–10.3)
Chloride: 100 mmol/L — ABNORMAL LOW (ref 101–111)
Creatinine, Ser: 1.06 mg/dL (ref 0.61–1.24)
GFR calc Af Amer: >60 mL/min (ref >60)
Glucose, Bld: 79 mg/dL (ref 65–99)
POTASSIUM: 4.2 mmol/L (ref 3.5–5.1)
SODIUM: 137 mmol/L (ref 135–145)

## 2017-06-29 LAB — GLUCOSE, CAPILLARY
Glucose-Capillary: 166 mg/dL — ABNORMAL HIGH (ref 65–99)
Glucose-Capillary: 223 mg/dL — ABNORMAL HIGH (ref 65–99)
Glucose-Capillary: 216 mg/dL — ABNORMAL HIGH (ref 65–99)
Glucose-Capillary: 183 mg/dL — ABNORMAL HIGH (ref 65–99)
Glucose-Capillary: 273 mg/dL — ABNORMAL HIGH (ref 65–99)

## 2017-06-29 LAB — CBC WITH DIFFERENTIAL/PLATELET
BASOS ABS: 0 10*3/uL (ref 0.0–0.1)
BASOS PCT: 0 %
EOS ABS: 0.1 10*3/uL (ref 0.0–0.7)
EOS PCT: 2 %
HCT: 32.1 % — ABNORMAL LOW (ref 39.0–52.0)
Hemoglobin: 10.4 g/dL — ABNORMAL LOW (ref 13.0–17.0)
LYMPHS ABS: 0.9 10*3/uL (ref 0.7–4.0)
Lymphocytes Relative: 17 %
MCH: 26.3 pg (ref 26.0–34.0)
MCHC: 32.4 g/dL (ref 30.0–36.0)
MCV: 81.3 fL (ref 78.0–100.0)
Monocytes Absolute: 0.7 10*3/uL (ref 0.1–1.0)
Monocytes Relative: 14 %
Neutro Abs: 3.4 10*3/uL (ref 1.7–7.7)
Neutrophils Relative %: 67 %
PLATELETS: 298 10*3/uL (ref 150–400)
RBC: 3.95 MIL/uL — AB (ref 4.22–5.81)
RDW: 13.2 % (ref 11.5–15.5)
WBC: 5 10*3/uL (ref 4.0–10.5)

## 2017-06-29 LAB — MAGNESIUM: MAGNESIUM: 1.8 mg/dL (ref 1.7–2.4)

## 2017-06-29 NOTE — Progress Notes (Signed)
Patient requesting bath earlier this shift. Myself and Nursing student advised patient we would come back and bathe him shortly. Patient continued to call out requesting a bath. Myself and Nursing student came in to bathe patient and he refused stated that the CNA was going to come and bathe him.

## 2017-06-29 NOTE — Progress Notes (Addendum)
Patient ID: Vincent Hansen, male   DOB: 03-15-1946, 72 y.o.   MRN: [id number]  PROGRESS NOTE    Vincent Hansen  DEY:[id number] DOB: [birth year] DOA: 05/30/2017 PCP: Patient, No Pcp Per    Brief Narrative:  72 year old male with history of hypertension, COPD, type 2 diabetes, prior history of squamous cell carcinoma of the larynx was admitted with hypoxia on 05/30/2017 and found to have large laryngeal mass obstructing airway status post emergent tracheostomy placement on 05/30/2017 and subsequent extubation on 05/31/2017.  During the hospitalization, PEG tube was placed for nutrition.  He underwent total laryngectomy on 06/16/2017 by ENT.  He was transferred to ENT service on 06/21/2017.  TRH is following the patient in consult.   Assessment & Plan:   Principal Problem:   Squamous cell carcinoma of larynx (HCC) Active Problems:   Acute respiratory failure with hypoxia (HCC)   Status post tracheostomy (HCC)   Malnutrition of moderate degree   Laryngeal carcinoma (HCC)   DM2 (diabetes mellitus, type 2) (HCC)   HTN (hypertension)   Abnormal CT scan, stomach   Hiatal hernia   Tracheostomy status (HCC)   Periodontitis, chronic   S/P laryngectomy   Post-operative complication  Agitation -Continue as needed Haldol. -Psychiatry evaluation appreciated: Psychiatry has signed off. -No signs of infection: Chest x-ray on 06/25/2017 was negative for infiltrates.  Blood cultures pending so far.  No fevers.  Acute respiratory failure with hypoxia secondary to squamous cell carcinoma of the larynx/severe oropharyngeal dysphagia -s/p tracheostomy-on 05/30/2017 -Patient had PEG tube placed on 06/09/2017 with initiation of feeding -ENT following, status post total laryngectomy on 06/16/2017 -Oncology has already evaluated the patient: Outpatient follow-up -Speech therapy will continue to follow -Diet advancement as per ENT -Tracheal aspirate showed abundant Pseudomonas aeruginosa  - had transient episode of  shortness of breath, may have had a mucous plug vs volume overload : improved after dose of IV lasix given 4/6 -Saturation dropped on 06/28/2017 but improved subsequently after mucous plug was suctioned.   Chronic periodontitis with bone loss  -Patient evaluated by dental medicine on 06/09/2017 and deferred dental treatment pending results of laryngectomy  Essential hypertension -Continue labetalol as needed, BP currently controlled  Diabetes mellitus, type II with hyperglycemia -Still with intermittent hypoglycemia.  Continue Lantus.  Continue Accu-Cheks every 6 hours   COPD -Currently without exacerbation, no wheezing -Stable.  Continue Dulera  Malnutrition of moderate degree -Patient has PEG tube in place and is currently on tube feeds -Diet advancement as per ENT   Subjective: Patient seen and examined at bedside.  Patient is awake, does not answer questions.  No overnight fever, nausea or vomiting.  objective: Vitals:   06/29/17 0729 06/29/17 0800 06/29/17 0826 06/29/17 0903  BP:   (!) 146/98   Pulse:  (!) 101 (!) 117   Resp:  (!) 21 20   Temp:   97.9 F (36.6 C)   TempSrc:   Oral   SpO2: 98% 100% 100% 99%  Weight:      Height:        Intake/Output Summary (Last 24 hours) at 06/29/2017 1112 Last data filed at 06/29/2017 0932 Gross per 24 hour  Intake 2225 ml  Output 2525 ml  Net -300 ml   Filed Weights   06/27/17 0613 06/28/17 1523 06/29/17 0540  Weight: 72.3 kg (159 lb 6.3 oz) 77.3 kg (170 lb 6.7 oz) 77 kg (169 lb 12.1 oz)    Examination:  General exam: No distress.  Chronically ill looking respiratory  system: Bilateral decreased breath sounds at bases  cardiovascular system: S1-S2 positive, rate controlled gastrointestinal system: Abdomen is nondistended, soft and nontender. Normal bowel sounds heard.  PEG tube present Extremities: No cyanosis, edema     Data Reviewed: I have personally reviewed following labs and imaging studies  CBC: Recent  Labs  Lab 06/25/17 0832 06/29/17 0236  WBC 6.3 5.0  NEUTROABS 5.1 3.4  HGB 10.9* 10.4*  HCT 33.7* 32.1*  MCV 81.4 81.3  PLT 312 623   Basic Metabolic Panel: Recent Labs  Lab 06/24/17 1346 06/25/17 0832 06/29/17 0236  NA  --  138 137  K  --  4.4 4.2  CL  --  99* 100*  CO2  --  29 29  GLUCOSE 406* 181* 79  BUN  --  11 12  CREATININE  --  0.95 1.06  CALCIUM  --  9.3 9.2  MG  --   --  1.8   GFR: Estimated Creatinine Clearance: 69.6 mL/min (by C-G formula based on SCr of 1.06 mg/dL). Liver Function Tests: No results for input(s): AST, ALT, ALKPHOS, BILITOT, PROT, ALBUMIN in the last 168 hours. No results for input(s): LIPASE, AMYLASE in the last 168 hours. No results for input(s): AMMONIA in the last 168 hours. Coagulation Profile: No results for input(s): INR, PROTIME in the last 168 hours. Cardiac Enzymes: No results for input(s): CKTOTAL, CKMB, CKMBINDEX, TROPONINI in the last 168 hours. BNP (last 3 results) No results for input(s): PROBNP in the last 8760 hours. HbA1C: No results for input(s): HGBA1C in the last 72 hours. CBG: Recent Labs  Lab 06/28/17 1224 06/28/17 1649 06/28/17 1938 06/28/17 2315 06/29/17 0823  GLUCAP 327* 46* 139* 322* 166*   Lipid Profile: No results for input(s): CHOL, HDL, LDLCALC, TRIG, CHOLHDL, LDLDIRECT in the last 72 hours. Thyroid Function Tests: No results for input(s): TSH, T4TOTAL, FREET4, T3FREE, THYROIDAB in the last 72 hours. Anemia Panel: No results for input(s): VITAMINB12, FOLATE, FERRITIN, TIBC, IRON, RETICCTPCT in the last 72 hours. Sepsis Labs: No results for input(s): PROCALCITON, LATICACIDVEN in the last 168 hours.  Recent Results (from the past 240 hour(s))  Culture, blood (routine x 2)     Status: None (Preliminary result)   Collection Time: 06/25/17  2:40 PM  Result Value Ref Range Status   Specimen Description BLOOD RIGHT ANTECUBITAL  Final   Special Requests   Final    BOTTLES DRAWN AEROBIC AND ANAEROBIC  Blood Culture adequate volume   Culture   Final    NO GROWTH 3 DAYS Performed at [facility], [address] [place] [address] [place], [place] [address]    Report Status PENDING  Incomplete  Culture, blood (routine x 2)     Status: None (Preliminary result)   Collection Time: 06/25/17  2:40 PM  Result Value Ref Range Status   Specimen Description BLOOD RIGHT ANTECUBITAL  Final   Special Requests   Final    BOTTLES DRAWN AEROBIC AND ANAEROBIC Blood Culture adequate volume   Culture   Final    NO GROWTH 3 DAYS Performed at [facility], [address], [place], [place] [address]    Report Status PENDING  Incomplete  MRSA PCR Screening     Status: None   Collection Time: 06/28/17  3:10 PM  Result Value Ref Range Status   MRSA by PCR NEGATIVE NEGATIVE Final    Comment:        The GeneXpert MRSA Assay (FDA approved for NASAL specimens  only), is one component of a comprehensive MRSA colonization surveillance program. It is not intended to diagnose MRSA infection nor to guide or monitor treatment for MRSA infections. Performed at [facility], [place] [address], [place], [place] [address]          Radiology Studies: No results found.      Scheduled Meds: . docusate  100 mg Per Tube BID  . feeding supplement (JEVITY 1.5 CAL/FIBER)  355 mL Per Tube QID  . feeding supplement (PRO-STAT SUGAR FREE 64)  30 mL Per Tube Daily  . haloperidol lactate  2 mg Intravenous Once  . insulin aspart  0-9 Units Subcutaneous Q4H  . insulin aspart  5 Units Subcutaneous UD  . insulin glargine  20 Units Subcutaneous QHS  . mouth rinse  15 mL Mouth Rinse QID  . mometasone-formoterol  2 puff Inhalation BID  . pantoprazole sodium  40 mg Per Tube Daily  . sennosides  5 mL Per Tube QHS   Continuous Infusions:   LOS: 30 days        Aline August, MD [facility] Pager [phone]  If 7PM-7AM, please contact night-coverage [email or web address] Password [email or web address] 06/29/2017, 11:12 AM

## 2017-06-29 NOTE — Progress Notes (Signed)
Called to patients room by CNA. Patient sitting on the edge of the bed in a panic. Patient with large mucus plug. Patient in respiratory distress with O2 sats noted in the 70's. Suctioned patient several times to clear plug. Patient returned to baseline with O2 sats back into the high 90's. RR 16 and non-labored. VS stable. Notified respiratory who came to bedside to evaluate patient. Will continue to monitor.

## 2017-06-29 NOTE — Progress Notes (Signed)
Attempted to call [facility] CS[facility] to discuss case- left voicemail  Jorge Ny, LCSW Clinical Social Worker [phone]

## 2017-06-29 NOTE — Progress Notes (Addendum)
Inpatient Diabetes Program Recommendations  AACE/ADA: New Consensus Statement on Inpatient Glycemic Control (2015)  Target Ranges:  Prepandial:   less than 140 mg/dL      Peak postprandial:   less than 180 mg/dL (1-2 hours)      Critically ill patients:  140 - 180 mg/dL   Lab Results  Component Value Date   GLUCAP 216 (H) 06/29/2017   HGBA1C 7.7 (H) 06/25/2017    Review of Glycemic ControlResults for RIGEL, FILSINGER (MRN [id number]) as of 06/29/2017 13:49  Ref. Range 06/28/2017 16:49 06/28/2017 19:38 06/28/2017 23:15 06/29/2017 08:23 06/29/2017 12:12  Glucose-Capillary Latest Ref Range: 65 - 99 mg/dL 46 (L) 139 (H) 322 (H) 166 (H) 223 (H)    Diabetes history: None Outpatient Diabetes medications: None Current orders for Inpatient glycemic control:  Lantus 20 units daily, Novolog sensitive q 4 hours, Novolog 5 units- 4 times a day as needed  Inpatient Diabetes Program Recommendations:  Note blood sugars up and down.  Patient has not gotten any tube feed coverage since 1322 on 4/15. May consider d/c of Novolog tube feed/meal coverage at this time? If blood sugars increase with tube feeds again, may consider reduced amount of tube feed coverage at 0800, 1200, 1600, and 2000.    Thanks,  Adah Perl, RN, BC-ADM Inpatient Diabetes Coordinator Pager [phone] (8a-5p)

## 2017-06-29 NOTE — Progress Notes (Signed)
  Speech Language Pathology Treatment: Cognitive-Linquistic  Patient Details Name: Vincent Hansen MRN: [id number] DOB: May 05, 1945 Today's Date: 06/29/2017 Time: 7829-5621 SLP Time Calculation (min) (ACUTE ONLY): 17 min  Assessment / Plan / Recommendation Clinical Impression  Attempted to see pt for additional practice with the electrolarynx, but he would not try to use it despite education and encouragement. SLP provided Mod cues for pt to keep his supplemental O2 at his stoma as he kept trying to move it up toward his mouth. Education including visuals were provided about rationale and anatomical changes s/p laryngectomy; unsure of level of comprehension. Pt intermittently appears to respond to questions appropriately, but tries to talk at a rapid rate and becomes frustrated quickly when SLP provided Mod-Max cues for use of communication strategies and/or alternative methods. He wrote very, which included a few words and numbers that did not make sense to this SLP. At the end of the session pt started to desaturate to the 19s with RN and RT notified and present to perform suction. Vitals normalized prior to SLP departure. Will continue to follow and attempt multimodal communication as able.   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  Pt is s/p emergent trach and order for swallow/PMSV evaluations received. CXR showed Low lung volumes with areas of scarring and/or subsegmental atelectasis in the lower lobes of the lungs bilaterally.     Pt has been seen for education re: laryngectomy. Today provided with Electrolarynx to "practice" prior to scheduled procedure 06/16/2017.  Pt reports frustration at not being able "to speak" - educated him that due to his copious secretions and ? impact of XL trach  - he has been unable to use PMSV.  In addition, reeducated him to plan to  have laryngectomy performed on Wednesday - therefore need to practice with electrolarynx.        SLP Plan  Continue with current plan of care       Recommendations                   Oral Care Recommendations: Oral care BID Follow up Recommendations: Skilled Nursing facility SLP Visit Diagnosis: Aphonia (R49.1) Plan: Continue with current plan of care       GO                Vincent Hansen 06/29/2017, 5:09 PM  Vincent Hansen, M.A. CCC-SLP [phone]

## 2017-06-29 NOTE — Progress Notes (Signed)
Total of 8 staples removed from patient laryngectomy site. Wound is well approximated and healing. No drainage noted. Applied steri-strips. Patient tolerated procedure well. VS stable will continue to monitor.

## 2017-06-29 NOTE — Progress Notes (Signed)
Physical Therapy Treatment Patient Details Name: Vincent Hansen MRN: [id number] DOB: 1945-08-29 Today's Date: 06/29/2017    History of Present Illness 72 year old with PMHx: HTN, COPD, DM, prior history of squamous cell carcinoma of the larynx admitted with hypoxia and found to have large laryngeal mass obstructing airway status post emergency tracheostomy 05/30/2017 extubation on 05/31/2017. Pt with PEG. Total laryngectomy 4/3    PT Comments    Pt able to perform gait with RW 300' with min guard this session.  Pt progressing well towards goals and is improving endurance and strength.   Follow Up Recommendations  SNF;Supervision for mobility/OOB     Equipment Recommendations  Rolling walker with 5" wheels    Recommendations for Other Services       Precautions / Restrictions Precautions Precautions: Fall Precaution Comments: trach Restrictions Weight Bearing Restrictions: No    Mobility  Bed Mobility   Bed Mobility: Supine to Sit     Supine to sit: Supervision        Transfers Overall transfer level: Needs assistance Equipment used: None Transfers: Sit to/from Stand;Stand Pivot Transfers Sit to Stand: Min guard Stand pivot transfers: Min guard       General transfer comment: steadying assist for balance with RW, min A without AD  Ambulation/Gait Ambulation/Gait assistance: Min guard Ambulation Distance (Feet): 300 Feet Assistive device: Rolling walker (2 wheeled)       General Gait Details: pt able to gait with RW 300' with min guard, slow cadence but no LOB, spO2 on room air throughout   Stairs             Wheelchair Mobility    Modified Rankin (Stroke Patients Only)       Balance               Standing balance comment: pt able to stand without UE support multiple times to write on white board with min guard during gait training.  pt able to negotiate obstacles with RW with min guard and cues for safety                             Cognition Arousal/Alertness: Awake/alert Behavior During Therapy: [facility] for tasks assessed/performed Overall Cognitive Status: Difficult to assess                                        Exercises      General Comments        Pertinent Vitals/Pain Pain Assessment: No/denies pain    Home Living                      Prior Function            PT Goals (current goals can now be found in the care plan section) Acute Rehab PT Goals Patient Stated Goal: none stated PT Goal Formulation: Patient unable to participate in goal setting Time For Goal Achievement: 07/05/17 Potential to Achieve Goals: Fair Progress towards PT goals: Progressing toward goals    Frequency    Min 2X/week      PT Plan Current plan remains appropriate    Co-evaluation              AM-PAC PT "6 Clicks" Daily Activity  Outcome Measure  Difficulty turning over in bed (including adjusting bedclothes, sheets and blankets)?: None Difficulty moving  from lying on back to sitting on the side of the bed? : A Little Difficulty sitting down on and standing up from a chair with arms (e.g., wheelchair, bedside commode, etc,.)?: A Little Help needed moving to and from a bed to chair (including a wheelchair)?: A Little Help needed walking in hospital room?: A Little Help needed climbing 3-5 steps with a railing? : A Lot 6 Click Score: 18    End of Session   Activity Tolerance: Patient tolerated treatment well Patient left: in bed;with call bell/phone within reach;with nursing/sitter in room Nurse Communication: Mobility status PT Visit Diagnosis: Other abnormalities of gait and mobility (R26.89);Difficulty in walking, not elsewhere classified (R26.2)     Time: 0830-0900 PT Time Calculation (min) (ACUTE ONLY): 30 min  Charges:  $Gait Training: 8-22 mins $Therapeutic Activity: 8-22 mins                    G Codes:       Isabelle Course, PT,  DPT   Kylin Genna 06/29/2017, 9:08 AM

## 2017-06-29 NOTE — Progress Notes (Signed)
Patient ID: Vincent Hansen, male   DOB: 1945/09/07, 72 y.o.   MRN: [id number] Subjective: No complaints.  Objective: Vital signs in last 24 hours: Temp:  [97.4 F (36.3 C)-99.3 F (37.4 C)] 97.9 F (36.6 C) (04/16 0826) Pulse Rate:  [84-117] 117 (04/16 0826) Resp:  [11-20] 20 (04/16 0826) BP: (123-146)/(79-98) 146/98 (04/16 0826) SpO2:  [62 %-100 %] 100 % (04/16 0826) FiO2 (%):  [28 %] 28 % (04/16 0826) Weight:  [77 kg (169 lb 12.1 oz)-77.3 kg (170 lb 6.7 oz)] 77 kg (169 lb 12.1 oz) (04/16 0540) Weight change:  Last BM Date: 06/28/17  Intake/Output from previous day: 04/15 0701 - 04/16 0700 In: 1630 [P.O.:800; NG/GT:770] Out: 2525 [Urine:2525] Intake/Output this shift: Total I/O In: 120 [P.O.:120] Out: -   PHYSICAL EXAM: Neck looks good, Stoma clean.  Lab Results: Recent Labs    06/29/17 0236  WBC 5.0  HGB 10.4*  HCT 32.1*  PLT 298   BMET Recent Labs    06/29/17 0236  NA 137  K 4.2  CL 100*  CO2 29  GLUCOSE 79  BUN 12  CREATININE 1.06  CALCIUM 9.2    Studies/Results: No results found.  Medications: I have reviewed the patient's current medications.  Assessment/Plan: Stable, continues to do well.  Continue monitoring oral intake and will stop G-tube feeding when he is drinking adequate amounts.  Continue to work on placement.  Staples out today.  LOS: 30 days   [name] 06/29/2017, 8:49 AM

## 2017-06-30 LAB — GLUCOSE, CAPILLARY
GLUCOSE-CAPILLARY: 250 mg/dL — AB (ref 65–99)
GLUCOSE-CAPILLARY: 179 mg/dL — AB (ref 65–99)
GLUCOSE-CAPILLARY: 277 mg/dL — AB (ref 65–99)
Glucose-Capillary: 170 mg/dL — ABNORMAL HIGH (ref 65–99)
Glucose-Capillary: 172 mg/dL — ABNORMAL HIGH (ref 65–99)
Glucose-Capillary: 254 mg/dL — ABNORMAL HIGH (ref 65–99)
Glucose-Capillary: 51 mg/dL — ABNORMAL LOW (ref 65–99)
Glucose-Capillary: 53 mg/dL — ABNORMAL LOW (ref 65–99)
Glucose-Capillary: 95 mg/dL (ref 65–99)

## 2017-06-30 LAB — CULTURE, BLOOD (ROUTINE X 2)
Culture: NO GROWTH
Culture: NO GROWTH
Special Requests: ADEQUATE
Special Requests: ADEQUATE

## 2017-06-30 MED ORDER — JEVITY 1.5 CAL/FIBER PO LIQD
355.0000 mL | Freq: Four times a day (QID) | ORAL | Status: DC
  Administered 2017-06-30 – 2017-07-04 (×15): 355 mL via ORAL
  Administered 2017-07-04: 120 mL via ORAL
  Administered 2017-07-04 – 2017-07-05 (×2): 355 mL via ORAL
  Filled 2017-06-30 (×30): qty 1000

## 2017-06-30 MED ORDER — IPRATROPIUM BROMIDE HFA 17 MCG/ACT IN AERS
2.0000 | INHALATION_SPRAY | RESPIRATORY_TRACT | Status: DC
  Filled 2017-06-30: qty 12.9

## 2017-06-30 MED ORDER — ACETYLCYSTEINE 10 % IN SOLN: 2.0000 mL | RESPIRATORY_TRACT | Status: DC

## 2017-06-30 MED ORDER — IPRATROPIUM-ALBUTEROL 0.5-2.5 (3) MG/3ML IN SOLN: 3.0000 mL | Freq: Four times a day (QID) | RESPIRATORY_TRACT | Status: DC

## 2017-06-30 MED ORDER — BUDESONIDE 0.5 MG/2ML IN SUSP
0.5000 mg | Freq: Two times a day (BID) | RESPIRATORY_TRACT | Status: DC
  Administered 2017-06-30 – 2017-07-06 (×12): 0.5 mg via RESPIRATORY_TRACT
  Filled 2017-06-30 (×12): qty 2

## 2017-06-30 MED ORDER — ALBUTEROL SULFATE (2.5 MG/3ML) 0.083% IN NEBU
2.5000 mg | INHALATION_SOLUTION | Freq: Three times a day (TID) | RESPIRATORY_TRACT | Status: AC
  Administered 2017-06-30 – 2017-07-03 (×9): 2.5 mg via RESPIRATORY_TRACT
  Filled 2017-06-30 (×10): qty 3

## 2017-06-30 MED ORDER — HALOPERIDOL LACTATE 5 MG/ML IJ SOLN
5.0000 mg | Freq: Four times a day (QID) | INTRAMUSCULAR | Status: DC | PRN
  Administered 2017-07-01: 5 mg via INTRAMUSCULAR
  Filled 2017-06-30: qty 1

## 2017-06-30 MED ORDER — IPRATROPIUM BROMIDE 0.02 % IN SOLN: 0.5000 mg | RESPIRATORY_TRACT | Status: DC

## 2017-06-30 MED ORDER — ACETYLCYSTEINE 20 % IN SOLN
2.0000 mL | Freq: Three times a day (TID) | RESPIRATORY_TRACT | Status: AC
  Administered 2017-07-01 – 2017-07-03 (×6): 2 mL via RESPIRATORY_TRACT
  Filled 2017-06-30 (×9): qty 4

## 2017-06-30 MED FILL — Medication: Qty: 1 | Status: AC

## 2017-06-30 NOTE — Progress Notes (Signed)
CSW received call from [place] at [facility] states that patient was rejected because their facilities are not trach capable and because patient has Medicare  No evidence of Medicare in our system- am having facility look up if Medicare is active and would allow for [place] to place patient through Medicare benefits.  If patient does NOT have medicare Mirna Mires stated we can attempt to get patient placed in [facility] which is trach capable  CSW continuing to follow and work towards placement  Jorge Ny, [place] Social Worker [phone]

## 2017-06-30 NOTE — Progress Notes (Signed)
Patient ID: Vincent Hansen, male   DOB: Oct 09, 1945, 72 y.o.   MRN: [id number] Subjective: Sitting in chair, very cooperative today, awake and alert.  Objective: Vital signs in last 24 hours: Temp:  [97.8 F (36.6 C)-98.6 F (37 C)] 98.5 F (36.9 C) (04/17 0747) Pulse Rate:  [88-96] 96 (04/17 0805) Resp:  [17-23] 23 (04/17 0805) BP: (134-149)/(82-98) 135/83 (04/17 0747) SpO2:  [94 %-100 %] 100 % (04/17 0805) FiO2 (%):  [28 %] 28 % (04/17 0805) Weight:  [77.1 kg (169 lb 15.6 oz)] 77.1 kg (169 lb 15.6 oz) (04/17 0413) Weight change: -0.2 kg (-7.1 oz) Last BM Date: 06/29/17  Intake/Output from previous day: 04/16 0701 - 04/17 0700 In: 1355 [P.O.:820; NG/GT:475] Out: 1950 [Urine:1950] Intake/Output this shift: No intake/output data recorded.  PHYSICAL EXAM: Couple of additional staples removed from the incision.  The tube was removed and a large mucous plug was cleaned out of the stoma.  Otherwise doing well.  Lab Results: Recent Labs    06/29/17 0236  WBC 5.0  HGB 10.4*  HCT 32.1*  PLT 298   BMET Recent Labs    06/29/17 0236  NA 137  K 4.2  CL 100*  CO2 29  GLUCOSE 79  BUN 12  CREATININE 1.06  CALCIUM 9.2    Studies/Results: No results found.  Medications: I have reviewed the patient's current medications.  Assessment/Plan: Stable, continue awaiting placement.  Instructions provided to nursing staff about cleaning out the stoma.  LOS: 31 days   [name] 06/30/2017, 9:54 AM

## 2017-06-30 NOTE — Progress Notes (Signed)
PROGRESS NOTE    Diangelo Radel  KXF:[id number] DOB: 15-Jun-1945 DOA: 05/30/2017 PCP: Patient, No Pcp Per   Brief Narrative:  72 year old with a history of essential hypertension, COPD, diabetes mellitus type 2, prior history of squamous cell carcinoma of his larynx came to the hospital in May 30, 2017 with hypoxia.  He was found to have a large laryngeal mass for which emergent tracheostomy was placed.  Patient also had a PEG tube placement for nutrition.  Laryngectomy was performed on June 16, 2017 by ENT and was transferred to their service.  TRH was consulted for medical management. [facility] was called on June 27, 2017 as patient had pulled out his laryngeal tube and became hypercapnic with concerns for mucous plugging and hypoxia.  Patient's airway was suctioned and his saturation improved therefore no further CPR was performed. At this point awaiting placement but patient does have episodes of agitation therefore psychiatry was consulted who recommended giving patient Haldol every 6 hours as necessary.   Assessment & Plan:   Principal Problem:   Squamous cell carcinoma of larynx (HCC) Active Problems:   Acute respiratory failure with hypoxia (HCC)   Status post tracheostomy (HCC)   Malnutrition of moderate degree   Laryngeal carcinoma (HCC)   DM2 (diabetes mellitus, type 2) (HCC)   HTN (hypertension)   Abnormal CT scan, stomach   Hiatal hernia   Tracheostomy status (HCC)   Periodontitis, chronic   S/P laryngectomy   Post-operative complication  Acute agitation and delirium - Patient is currently on Haldol.  No EKG in chart therefore we will get an EKG right now.  If necessary will increase the dose -Avoid using benzodiazepine, will discontinue.  Acute respiratory failure with hypoxia secondary to squamous cell carcinoma of his larynx status post laryngectomy -Management per ENT -At this time patient is a high risk of mucous plugging, will order nebulizer treatments  around-the-clock - Provide supportive care.  Match input and output -Lasix if necessary  Essential hypertension -Blood pressure appears to be in relatively normal range.  Will closely monitor this  Diabetes mellitus type 2 Lantus 20 units at bedtime, insulin sliding scale  COPD -Nebulizer treatments  Moderate protein calorie malnutrition -Currently PEG tube is in place.  Getting tube feeds.    DVT prophylaxis: Per primary team Code Status: Full code none at bedside Family Communication:   Disposition Plan: Awaiting placement  Subjective: Patient is extremely agitated and combative at this  Review of Systems Otherwise negative except as per HPI, including: General: Denies fever, chills, night sweats or unintended weight loss. Resp: Denies cough, wheezing, shortness of breath. Cardiac: Denies chest pain, palpitations, orthopnea, paroxysmal nocturnal dyspnea. GI: Denies abdominal pain, nausea, vomiting, diarrhea or constipation GU: Denies dysuria, frequency, hesitancy or incontinence MS: Denies muscle aches, joint pain or swelling Neuro: Denies headache, neurologic deficits (focal weakness, numbness, tingling), abnormal gait Psych: Denies anxiety, depression, SI/HI/AVH Skin: Denies new rashes or lesions ID: Denies sick contacts, exotic exposures, travel  Objective: Vitals:   06/30/17 0805 06/30/17 0830 06/30/17 1100 06/30/17 1150  BP:    127/78  Pulse: 96 (!) 56 98 87  Resp: (!) 23 16 18  (!) 23  Temp:    98.9 F (37.2 C)  TempSrc:    Oral  SpO2: 100% 97% 95% 98%  Weight:      Height:        Intake/Output Summary (Last 24 hours) at 06/30/2017 1459 Last data filed at 06/30/2017 1400 Gross per 24 hour  Intake  1335 ml  Output 1550 ml  Net -215 ml   Filed Weights   06/28/17 1523 06/29/17 0540 06/30/17 0413  Weight: 77.3 kg (170 lb 6.7 oz) 77 kg (169 lb 12.1 oz) 77.1 kg (169 lb 15.6 oz)    Examination:  General exam: Extremely agitated and  combative Respiratory system: Clear to auscultation. Respiratory effort normal.  Has a trach in place, large mucous plug was removed Cardiovascular system: S1 & S2 heard, RRR. No JVD, murmurs, rubs, gallops or clicks. No pedal edema. Gastrointestinal system: Abdomen is nondistended, soft and nontender. No organomegaly or masses felt. Normal bowel sounds heard. Central nervous system: Alert and oriented. No focal neurological deficits. Extremities: Symmetric 5 x 5 power. Skin: No rashes, lesions or ulcers Psychiatry: Poor judgment, agitation    Data Reviewed:   CBC: Recent Labs  Lab 06/25/17 0832 06/29/17 0236  WBC 6.3 5.0  NEUTROABS 5.1 3.4  HGB 10.9* 10.4*  HCT 33.7* 32.1*  MCV 81.4 81.3  PLT 312 696   Basic Metabolic Panel: Recent Labs  Lab 06/24/17 1346 06/25/17 0832 06/29/17 0236  NA  --  138 137  K  --  4.4 4.2  CL  --  99* 100*  CO2  --  29 29  GLUCOSE 406* 181* 79  BUN  --  11 12  CREATININE  --  0.95 1.06  CALCIUM  --  9.3 9.2  MG  --   --  1.8   GFR: Estimated Creatinine Clearance: 69.7 mL/min (by C-G formula based on SCr of 1.06 mg/dL). Liver Function Tests: No results for input(s): AST, ALT, ALKPHOS, BILITOT, PROT, ALBUMIN in the last 168 hours. No results for input(s): LIPASE, AMYLASE in the last 168 hours. No results for input(s): AMMONIA in the last 168 hours. Coagulation Profile: No results for input(s): INR, PROTIME in the last 168 hours. Cardiac Enzymes: No results for input(s): CKTOTAL, CKMB, CKMBINDEX, TROPONINI in the last 168 hours. BNP (last 3 results) No results for input(s): PROBNP in the last 8760 hours. HbA1C: No results for input(s): HGBA1C in the last 72 hours. CBG: Recent Labs  Lab 06/30/17 0353 06/30/17 0354 06/30/17 0426 06/30/17 0750 06/30/17 1147  GLUCAP 53* 51* 95 170* 250*   Lipid Profile: No results for input(s): CHOL, HDL, LDLCALC, TRIG, CHOLHDL, LDLDIRECT in the last 72 hours. Thyroid Function Tests: No results  for input(s): TSH, T4TOTAL, FREET4, T3FREE, THYROIDAB in the last 72 hours. Anemia Panel: No results for input(s): VITAMINB12, FOLATE, FERRITIN, TIBC, IRON, RETICCTPCT in the last 72 hours. Sepsis Labs: No results for input(s): PROCALCITON, LATICACIDVEN in the last 168 hours.  Recent Results (from the past 240 hour(s))  Culture, blood (routine x 2)     Status: None (Preliminary result)   Collection Time: 06/25/17  2:40 PM  Result Value Ref Range Status   Specimen Description BLOOD RIGHT ANTECUBITAL  Final   Special Requests   Final    BOTTLES DRAWN AEROBIC AND ANAEROBIC Blood Culture adequate volume   Culture   Final    NO GROWTH 4 DAYS Performed at [facility], [address] [place], [place] [address]    Report Status PENDING  Incomplete  Culture, blood (routine x 2)     Status: None (Preliminary result)   Collection Time: 06/25/17  2:40 PM  Result Value Ref Range Status   Specimen Description BLOOD RIGHT ANTECUBITAL  Final   Special Requests   Final    BOTTLES DRAWN AEROBIC AND ANAEROBIC Blood  Culture adequate volume   Culture   Final    NO GROWTH 4 DAYS Performed at [facility], [place] [address], [place], [place] [address]    Report Status PENDING  Incomplete  MRSA PCR Screening     Status: None   Collection Time: 06/28/17  3:10 PM  Result Value Ref Range Status   MRSA by PCR NEGATIVE NEGATIVE Final    Comment:        The GeneXpert MRSA Assay (FDA approved for NASAL specimens only), is one component of a comprehensive MRSA colonization surveillance program. It is not intended to diagnose MRSA infection nor to guide or monitor treatment for MRSA infections. Performed at [facility], [place] [address], [place], [place] [address]   Culture, Urine     Status: Abnormal (Preliminary result)   Collection Time: 06/28/17  9:03 PM  Result Value Ref Range Status   Specimen Description URINE, CLEAN CATCH  Final   Special Requests NONE  Final   Culture (A)   Final    >=100,000 COLONIES/mL PSEUDOMONAS AERUGINOSA SUSCEPTIBILITIES TO FOLLOW Performed at [facility], [address], [place], [place] [address]    Report Status PENDING  Incomplete         Radiology Studies: No results found.      Scheduled Meds: . docusate  100 mg Per Tube BID  . feeding supplement (JEVITY 1.5 CAL/FIBER)  355 mL Oral QID  . feeding supplement (PRO-STAT SUGAR FREE 64)  30 mL Per Tube Daily  . haloperidol lactate  2 mg Intravenous Once  . insulin aspart  0-9 Units Subcutaneous Q4H  . insulin glargine  20 Units Subcutaneous QHS   Continuous Infusions:   LOS: 31 days    Time spent: 30 mins    Ankit Arsenio Loader, MD [facility] Pager [phone]   If 7PM-7AM, please contact night-coverage [email or web address] Password [facility] 06/30/2017, 2:59 PM

## 2017-06-30 NOTE — Progress Notes (Signed)
0400: Patients morning CBG:51. Patient A&Ox4. Sitting up and using white board to communicate. Apple juice and apple sauce given to patient.  0430: CBG:95
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
## 2017-07-01 LAB — URINE CULTURE

## 2017-07-01 LAB — GLUCOSE, CAPILLARY
GLUCOSE-CAPILLARY: 148 mg/dL — AB (ref 65–99)
GLUCOSE-CAPILLARY: 155 mg/dL — AB (ref 65–99)
GLUCOSE-CAPILLARY: 135 mg/dL — AB (ref 65–99)
Glucose-Capillary: 116 mg/dL — ABNORMAL HIGH (ref 65–99)
Glucose-Capillary: 311 mg/dL — ABNORMAL HIGH (ref 65–99)

## 2017-07-01 MED ORDER — HALOPERIDOL LACTATE 5 MG/ML IJ SOLN: 5.0000 mg | Freq: Four times a day (QID) | INTRAMUSCULAR | Status: DC | PRN

## 2017-07-01 MED ORDER — HALOPERIDOL LACTATE 5 MG/ML IJ SOLN
5.0000 mg | Freq: Four times a day (QID) | INTRAMUSCULAR | Status: DC | PRN
  Administered 2017-07-01 – 2017-07-05 (×7): 5 mg via INTRAVENOUS
  Filled 2017-07-01 (×7): qty 1

## 2017-07-01 MED ORDER — HYDROCODONE-ACETAMINOPHEN 5-325 MG PO TABS
1.0000 | ORAL_TABLET | Freq: Four times a day (QID) | ORAL | Status: DC | PRN
  Administered 2017-07-01: 1 via ORAL
  Administered 2017-07-02 (×2): 2 via ORAL
  Administered 2017-07-02 – 2017-07-04 (×7): 1 via ORAL
  Administered 2017-07-05: 2 via ORAL
  Filled 2017-07-01: qty 1
  Filled 2017-07-01: qty 2
  Filled 2017-07-01 (×4): qty 1
  Filled 2017-07-01: qty 2
  Filled 2017-07-01 (×3): qty 1
  Filled 2017-07-01: qty 2

## 2017-07-01 NOTE — Progress Notes (Signed)
Called by Nursing staff because pt pulled out his trach tube.  Reportedly pt was up to [facility] and pulled his laryngeal tube out.  Upon arrival, pt was in bed and [facility] RRT was replacing pts laryngeal tube.  Pt was spontaneously breathing with sats briefly in the 60s and 70s with an accurate waveform.  Pt was suctioned by Tim RRT and a large bloody mucous plug was removed.  Pt nodded yes when asked if he was breathing better.  Pts sats immediately returned to 97% and pt was placed on aerosolized TC by RRT.

## 2017-07-01 NOTE — Progress Notes (Addendum)
Nutrition Follow-up  DOCUMENTATION CODES:   Non-severe (moderate) malnutrition in context of chronic illness  INTERVENTION:   Continue bolus TF regimen:  Jevity 1.5 formula: 1.5 cans QID  Prostat 30 ml daily via tube  Provides: 2230 kcal, 105 gm proteinn, 1080 ml free water daily  NUTRITION DIAGNOSIS:   Moderate Malnutrition related to chronic illness(COPD, Type 2 DM) as evidenced by moderate muscle depletion, moderate fat depletion, ongoing  GOAL:   Patient will meet greater than or equal to 90% of their needs, met  MONITOR:   TF tolerance, Labs, Weight trends, Skin  ASSESSMENT:   Pt with PMH of HTN, COPD, type 2 DM, prior hx of squamous cell ca of larynx admitted with hypoxia and found to have large laryngeal mass obstructing airway s/p emergent trach 3/17. Total laryngectomy 4/3.   3/17 s/p trach  3/27 s/p PEG  4/03 s/p total laryngectomy per ENT  Pt resting in bed. Has a laryngeal tube.  Spoke with Nurse Tech. Reports pt eating well. PO intake 100%. Continues to receive bolus TF with Jevity 1.5 formula. Tolerating well. Labs and medications reviewed. CBG's [id number].  Diet Order:  Diet full liquid Room service appropriate? Yes; Fluid consistency: Thin  EDUCATION NEEDS:   No education needs have been identified at this time  Skin:  Skin Assessment: Skin Integrity Issues: Skin Integrity Issues:: Incisions Incisions: s/p PEG placement (abdomen)  Last BM:  4/17  Intake/Output Summary (Last 24 hours) at 07/01/2017 1555 Last data filed at 07/01/2017 1520 Gross per 24 hour  Intake 1657 ml  Output 1375 ml  Net 282 ml   Height:   Ht Readings from Last 1 Encounters:  06/10/17 6' (1.829 m)    Weight:   Wt Readings from Last 1 Encounters:  07/01/17 165 lb (74.8 kg)    Ideal Body Weight:  80.91 kg  BMI:  Body mass index is 22.38 kg/m.  Estimated Nutritional Needs:   Kcal:  2100-2300  Protein:  105-115 gm  Fluid:  2.1-2.3 L  Arthur Holms,  RD, LDN Pager #: [phone] After-Hours Pager #: [phone]

## 2017-07-01 NOTE — Progress Notes (Signed)
[facility] financial counselors confirmed that patient does not have active medicare so VA is the only form of insurance  CSW called pt [employer] CSW to inform and inquire about how to apply for [facility] placement- left message  Jorge Ny, [place] Social Worker [phone]

## 2017-07-01 NOTE — Progress Notes (Signed)
Call ed by RN because pt had pulled out laryngeal tube and and was SOB. Placed obturator in laryngeal tube and verified with ETCO2 color change, SpO2 stats increased, WOB decreased, BBS present. Placed pt on trach collar and pt status improved. RT to cont to monitor.

## 2017-07-01 NOTE — Progress Notes (Signed)
Patient ID: Vincent Hansen, male   DOB: 1945/11/30, 72 y.o.   MRN: [id number]   He is awake and alert this morning.  The tube was removed and the stoma was inspected and was completely clear today.  Neck looks excellent.  Continue full liquid diet.  Encouraged him to drink more and more.  Continue stoma care and awaiting placement.

## 2017-07-01 NOTE — Progress Notes (Signed)
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  He was found to have a large laryngeal mass for which emergent tracheostomy was placed.  Patient also had a PEG tube placement for nutrition.  Laryngectomy was performed on June 16, 2017 by ENT and was transferred to their service.  TRH was consulted for medical management. CODE BLUE was called on June 27, 2017 as patient had pulled out his laryngeal tube and became hypercapnic with concerns for mucous plugging and hypoxia.  Patient's airway was suctioned and his saturation improved therefore no further CPR was performed. At this point awaiting placement but patient does have episodes of agitation therefore psychiatry was consulted who recommended giving patient Haldol every 6 hours as necessary.  Patient has frequent episodes of agitation therefore sitter was ordered.   Assessment & Plan:   Principal Problem:   Squamous cell carcinoma of larynx (HCC) Active Problems:   Acute respiratory failure with hypoxia (HCC)   Status post tracheostomy (HCC)   Malnutrition of moderate degree   Laryngeal carcinoma (HCC)   DM2 (diabetes mellitus, type 2) (HCC)   HTN (hypertension)   Abnormal CT scan, stomach   Hiatal hernia   Tracheostomy status (HCC)   Periodontitis, chronic   S/P laryngectomy   Post-operative complication  Acute agitation and delirium -Currently patient is getting Haldol as needed.  EKG is normal sinus rhythm without any prolonged QTC -Avoid using benzodiazepine, will discontinue.  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  Match input and output -Lasix if necessary  Essential hypertension -Blood pressure appears to be in relatively normal range.  Will closely monitor this  Diabetes mellitus type 2 Lantus 20 units at bedtime, insulin sliding scale  COPD -Nebulizer treatments  Moderate protein calorie malnutrition -Currently PEG tube is in place.  Getting tube feeds.    DVT prophylaxis: Per primary team Code Status: Full code none at bedside Family Communication:   Disposition Plan: Awaiting placement  Subjective: Overnight patient had episode of desaturation therefore rapid response was called.  A large bloody mucous plug was suctioned which immediately improved patient's oxygen saturation to 97% HEENT he felt better.  Review of Systems Otherwise negative except as per HPI, including: Difficult to obtain as patient is unable to communicate effectively  Objective: Vitals:   07/01/17 0714 07/01/17 0734 07/01/17 1211 07/01/17 1226  BP: 129/80 129/80 121/72   Pulse: (!) 56 64 64 62  Resp: 14 15 14 16   Temp: 98.9 F (37.2 C)  98.9 F (37.2 C)   TempSrc: Axillary  Axillary   SpO2: 100% 100% 100% 100%  Weight:      Height:        Intake/Output Summary (Last 24 hours) at 07/01/2017 1227 Last data filed at 07/01/2017 1100 Gross per 24 hour  Intake 1517 ml  Output 1075 ml  Net 442 ml   Filed Weights   06/29/17 0540 06/30/17 0413 07/01/17 0434  Weight: 77 kg (169 lb 12.1 oz) 77.1 kg (169 lb 15.6 oz) 74.8 kg (165 lb)  Examination:  General exam: Awake alert sitting on his bed eating breakfast when I saw him this morning.  Trach in place Respiratory system: Clear to auscultation. Respiratory effort normal.  Has a trach in place, large mucous plug was removed Cardiovascular system: S1 & S2 heard, RRR. No JVD, murmurs, rubs, gallops or clicks. No pedal edema. Gastrointestinal system: Abdomen is  nondistended, soft and nontender. No organomegaly or masses felt. Normal bowel sounds heard. Central nervous system: Alert and oriented. No focal neurological deficits. Extremities: Symmetric 5 x 5 power. Skin: No rashes, lesions or ulcers Psychiatry: Poor judgment, agitation    Data Reviewed:   CBC: Recent Labs  Lab 06/25/17 0832 06/29/17 0236  WBC 6.3 5.0  NEUTROABS 5.1 3.4  HGB 10.9* 10.4*  HCT 33.7* 32.1*  MCV 81.4 81.3  PLT 312 195   Basic Metabolic Panel: Recent Labs  Lab 06/24/17 1346 06/25/17 0832 06/29/17 0236  NA  --  138 137  K  --  4.4 4.2  CL  --  99* 100*  CO2  --  29 29  GLUCOSE 406* 181* 79  BUN  --  11 12  CREATININE  --  0.95 1.06  CALCIUM  --  9.3 9.2  MG  --   --  1.8   GFR: Estimated Creatinine Clearance: 67.6 mL/min (by C-G formula based on SCr of 1.06 mg/dL). Liver Function Tests: No results for input(s): AST, ALT, ALKPHOS, BILITOT, PROT, ALBUMIN in the last 168 hours. No results for input(s): LIPASE, AMYLASE in the last 168 hours. No results for input(s): AMMONIA in the last 168 hours. Coagulation Profile: No results for input(s): INR, PROTIME in the last 168 hours. Cardiac Enzymes: No results for input(s): CKTOTAL, CKMB, CKMBINDEX, TROPONINI in the last 168 hours. BNP (last 3 results) No results for input(s): PROBNP in the last 8760 hours. HbA1C: No results for input(s): HGBA1C in the last 72 hours. CBG: Recent Labs  Lab 06/30/17 2007 06/30/17 2341 07/01/17 0445 07/01/17 0754 07/01/17 1216  GLUCAP 277* 172* 148* 116* 311*   Lipid Profile: No results for input(s): CHOL, HDL, LDLCALC, TRIG, CHOLHDL, LDLDIRECT in the last 72 hours. Thyroid Function Tests: No results for input(s): TSH, T4TOTAL, FREET4, T3FREE, THYROIDAB in the last 72 hours. Anemia Panel: No results for input(s): VITAMINB12, FOLATE, FERRITIN, TIBC, IRON, RETICCTPCT in the last 72 hours. Sepsis Labs: No results for input(s): PROCALCITON, LATICACIDVEN in the last  168 hours.  Recent Results (from the past 240 hour(s))  Culture, blood (routine x 2)     Status: None   Collection Time: 06/25/17  2:40 PM  Result Value Ref Range Status   Specimen Description BLOOD RIGHT ANTECUBITAL  Final   Special Requests   Final    BOTTLES DRAWN AEROBIC AND ANAEROBIC Blood Culture adequate volume   Culture   Final    NO GROWTH 5 DAYS Performed at [facility], [address], [place] [address]    Report Status 06/30/2017 FINAL  Final  Culture, blood (routine x 2)     Status: None   Collection Time: 06/25/17  2:40 PM  Result Value Ref Range Status   Specimen Description BLOOD RIGHT ANTECUBITAL  Final   Special Requests   Final    BOTTLES DRAWN AEROBIC AND ANAEROBIC Blood Culture adequate volume   Culture   Final    NO GROWTH 5 DAYS Performed at [facility], [place] [address] [place], [place] [address]    Report Status  06/30/2017 FINAL  Final  MRSA PCR Screening     Status: None   Collection Time: 06/28/17  3:10 PM  Result Value Ref Range Status   MRSA by PCR NEGATIVE NEGATIVE Final    Comment:        The GeneXpert MRSA Assay (FDA approved for NASAL specimens only), is one component of a comprehensive MRSA colonization surveillance program. It is not intended to diagnose MRSA infection nor to guide or monitor treatment for MRSA infections. Performed at [facility], [place] [address], [place], [place] [address]   Culture, Urine     Status: Abnormal   Collection Time: 06/28/17  9:03 PM  Result Value Ref Range Status   Specimen Description URINE, CLEAN CATCH  Final   Special Requests   Final    NONE Performed at [facility], [place] [address] [place], [place] [address]    Culture (A)  Final    >=100,000 COLONIES/mL PSEUDOMONAS AERUGINOSA >=100,000 COLONIES/mL ENTEROBACTER AEROGENES    Report Status 07/01/2017 FINAL  Final   Organism ID, Bacteria ENTEROBACTER AEROGENES (A)  Final   Organism ID, Bacteria PSEUDOMONAS  AERUGINOSA (A)  Final      Susceptibility   Enterobacter aerogenes - MIC*    CEFAZOLIN >=64 RESISTANT Resistant     CEFTRIAXONE <=1 SENSITIVE Sensitive     CIPROFLOXACIN <=0.25 SENSITIVE Sensitive     GENTAMICIN <=1 SENSITIVE Sensitive     IMIPENEM 2 SENSITIVE Sensitive     NITROFURANTOIN 128 RESISTANT Resistant     TRIMETH/SULFA <=20 SENSITIVE Sensitive     PIP/TAZO <=4 SENSITIVE Sensitive     * >=100,000 COLONIES/mL ENTEROBACTER AEROGENES   Pseudomonas aeruginosa - MIC*    CEFTAZIDIME 4 SENSITIVE Sensitive     CIPROFLOXACIN <=0.25 SENSITIVE Sensitive     GENTAMICIN <=1 SENSITIVE Sensitive     IMIPENEM 1 SENSITIVE Sensitive     PIP/TAZO 8 SENSITIVE Sensitive     CEFEPIME 2 SENSITIVE Sensitive     * >=100,000 COLONIES/mL PSEUDOMONAS AERUGINOSA         Radiology Studies: No results found.      Scheduled Meds: . acetylcysteine  2 mL Nebulization TID  . albuterol  2.5 mg Nebulization TID  . budesonide (PULMICORT) nebulizer solution  0.5 mg Nebulization BID  . docusate  100 mg Per Tube BID  . feeding supplement (JEVITY 1.5 CAL/FIBER)  355 mL Oral QID  . feeding supplement (PRO-STAT SUGAR FREE 64)  30 mL Per Tube Daily  . haloperidol lactate  2 mg Intravenous Once  . insulin aspart  0-9 Units Subcutaneous Q4H  . insulin glargine  20 Units Subcutaneous QHS   Continuous Infusions:   LOS: 32 days    Time spent: 30 mins    Ankit Arsenio Loader, MD [facility] Pager [phone]   If 7PM-7AM, please contact night-coverage [email or web address] Password [facility] 07/01/2017, 12:27 PM

## 2017-07-02 LAB — GLUCOSE, CAPILLARY
GLUCOSE-CAPILLARY: 114 mg/dL — AB (ref 65–99)
GLUCOSE-CAPILLARY: 185 mg/dL — AB (ref 65–99)
GLUCOSE-CAPILLARY: 207 mg/dL — AB (ref 65–99)
GLUCOSE-CAPILLARY: 270 mg/dL — AB (ref 65–99)
Glucose-Capillary: 106 mg/dL — ABNORMAL HIGH (ref 65–99)
Glucose-Capillary: 242 mg/dL — ABNORMAL HIGH (ref 65–99)
Glucose-Capillary: 212 mg/dL — ABNORMAL HIGH (ref 65–99)

## 2017-07-02 NOTE — Progress Notes (Signed)
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  He was found to have a large laryngeal mass for which emergent tracheostomy was placed.  Patient also had a PEG tube placement for nutrition.  Laryngectomy was performed on June 16, 2017 by ENT and was transferred to their service.  TRH was consulted for medical management. CODE BLUE was called on June 27, 2017 as patient had pulled out his laryngeal tube and became hypercapnic with concerns for mucous plugging and hypoxia.  Patient's airway was suctioned and his saturation improved therefore no further CPR was performed. At this point awaiting placement but patient does have episodes of agitation therefore psychiatry was consulted who recommended giving patient Haldol every 6 hours as necessary.  Patient has frequent episodes of agitation therefore sitter was ordered.   Assessment & Plan:   Principal Problem:   Squamous cell carcinoma of larynx (HCC) Active Problems:   Acute respiratory failure with hypoxia (HCC)   Status post tracheostomy (HCC)   Malnutrition of moderate degree   Laryngeal carcinoma (HCC)   DM2 (diabetes mellitus, type 2) (HCC)   HTN (hypertension)   Abnormal CT scan, stomach   Hiatal hernia   Tracheostomy status (HCC)   Periodontitis, chronic   S/P laryngectomy   Post-operative complication  Acute agitation and delirium; improved today  -Currently patient is getting Haldol as needed.  EKG is normal sinus rhythm without any prolonged QTC -Avoid using benzodiazepine, will discontinue.  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  Match input and output -Lasix if necessary  Essential hypertension -Blood pressure appears to be in relatively normal range.  Will closely monitor this  Diabetes mellitus type 2 -Lantus 20 units at bedtime, insulin sliding scale.  Blood glucose remains in stable range  COPD -Nebulizer treatments  Moderate protein calorie malnutrition -Currently PEG tube is in place.  Getting tube feeds.  DVT prophylaxis: Per primary team Code Status: Full code none at bedside Family Communication:   Disposition Plan: ok to be discharged from medical standpoint when placement arrangements made.   Subjective: No complaints.   Review of Systems Otherwise negative except as per HPI, including: HEENT/EYES = negative for pain, redness, loss of vision, double vision, blurred vision, loss of hearing, sore throat, hoarseness, dysphagia Cardiovascular= negative for chest pain, palpitation, murmurs, lower extremity swelling Respiratory/lungs= negative for shortness of breath, cough, hemoptysis, wheezing, mucus production Gastrointestinal= negative for nausea, vomiting,, abdominal pain, melena, hematemesis Genitourinary= negative for Dysuria, Hematuria, Change in Urinary Frequency MSK = Negative for arthralgia, myalgias, Back Pain, Joint swelling  Neurology= Negative for headache, seizures, numbness, tingling  Psychiatry= Negative for anxiety, depression, suicidal and homocidal ideation Allergy/Immunology= Medication/Food allergy as listed  Skin= Negative for Rash, lesions, ulcers, itching  Objective: Vitals:   07/02/17 0339 07/02/17 0700 07/02/17 0733 07/02/17 0842  BP: 137/78  127/67   Pulse: 85 76 63   Resp: 20  19   Temp: 98 F (36.7 C)  98 F (36.7 C)   TempSrc: Oral  Oral   SpO2: 96% 99% 100% 100%  Weight:      Height:        Intake/Output Summary (Last 24 hours) at 07/02/2017 1042 Last data filed at  07/02/2017 0730 Gross per 24 hour  Intake 2090 ml  Output 1625 ml  Net 465 ml   Filed Weights   06/29/17 0540 06/30/17 0413 07/01/17 0434  Weight: 77 kg (169 lb 12.1 oz) 77.1 kg (169 lb 15.6 oz) 74.8 kg (165 lb)    Examination:  General exam: Awake alert sitting up on the bed, communicating effectively using dry erase board.  Respiratory system: Clear to auscultation. Respiratory effort normal.  Has a trach in place Cardiovascular system: S1 & S2 heard, RRR. No JVD, murmurs, rubs, gallops or clicks. No pedal edema. Gastrointestinal system: Abdomen is nondistended, soft and nontender. No organomegaly or masses felt. Normal bowel sounds heard. Central nervous system: Alert and oriented. No focal neurological deficits. Extremities: Symmetric 5 x 5 power. Skin: No rashes, lesions or ulcers Psychiatry: Poor judgment, agitation    Data Reviewed:   CBC: Recent Labs  Lab 06/29/17 0236  WBC 5.0  NEUTROABS 3.4  HGB 10.4*  HCT 32.1*  MCV 81.3  PLT 440   Basic Metabolic Panel: Recent Labs  Lab 06/29/17 0236  NA 137  K 4.2  CL 100*  CO2 29  GLUCOSE 79  BUN 12  CREATININE 1.06  CALCIUM 9.2  MG 1.8   GFR: Estimated Creatinine Clearance: 67.6 mL/min (by C-G formula based on SCr of 1.06 mg/dL). Liver Function Tests: No results for input(s): AST, ALT, ALKPHOS, BILITOT, PROT, ALBUMIN in the last 168 hours. No results for input(s): LIPASE, AMYLASE in the last 168 hours. No results for input(s): AMMONIA in the last 168 hours. Coagulation Profile: No results for input(s): INR, PROTIME in the last 168 hours. Cardiac Enzymes: No results for input(s): CKTOTAL, CKMB, CKMBINDEX, TROPONINI in the last 168 hours. BNP (last 3 results) No results for input(s): PROBNP in the last 8760 hours. HbA1C: No results for input(s): HGBA1C in the last 72 hours. CBG: Recent Labs  Lab 07/01/17 1651 07/01/17 2002 07/02/17 0003 07/02/17 0330 07/02/17 0726  GLUCAP 155* 135* 270* 106* 114*    Lipid Profile: No results for input(s): CHOL, HDL, LDLCALC, TRIG, CHOLHDL, LDLDIRECT in the last 72 hours. Thyroid Function Tests: No results for input(s): TSH, T4TOTAL, FREET4, T3FREE, THYROIDAB in the last 72 hours. Anemia Panel: No results for input(s): VITAMINB12, FOLATE, FERRITIN, TIBC, IRON, RETICCTPCT in the last 72 hours. Sepsis Labs: No results for input(s): PROCALCITON, LATICACIDVEN in the last 168 hours.  Recent Results (from the past 240 hour(s))  Culture, blood (routine x 2)     Status: None   Collection Time: 06/25/17  2:40 PM  Result Value Ref Range Status   Specimen Description BLOOD RIGHT ANTECUBITAL  Final   Special Requests   Final    BOTTLES DRAWN AEROBIC AND ANAEROBIC Blood Culture adequate volume   Culture   Final    NO GROWTH 5 DAYS Performed at [facility], [address] [place], [place] [address]    Report Status 06/30/2017 FINAL  Final  Culture, blood (routine x 2)     Status: None   Collection Time: 06/25/17  2:40 PM  Result Value Ref Range Status   Specimen Description BLOOD RIGHT ANTECUBITAL  Final   Special Requests   Final    BOTTLES DRAWN AEROBIC AND ANAEROBIC Blood Culture adequate volume   Culture   Final    NO GROWTH  5 DAYS Performed at [facility], [place] [address] [place], [place] [address]    Report Status 06/30/2017 FINAL  Final  MRSA PCR Screening     Status: None   Collection Time: 06/28/17  3:10 PM  Result Value Ref Range Status   MRSA by PCR NEGATIVE NEGATIVE Final    Comment:        The GeneXpert MRSA Assay (FDA approved for NASAL specimens only), is one component of a comprehensive MRSA colonization surveillance program. It is not intended to diagnose MRSA infection nor to guide or monitor treatment for MRSA infections. Performed at [facility], [place] [address] [place], [place] [address]   Culture, Urine     Status: Abnormal   Collection Time: 06/28/17  9:03 PM  Result Value Ref Range Status    Specimen Description URINE, CLEAN CATCH  Final   Special Requests   Final    NONE Performed at [facility], [place] [address] [place], [place] [address]    Culture (A)  Final    >=100,000 COLONIES/mL PSEUDOMONAS AERUGINOSA >=100,000 COLONIES/mL ENTEROBACTER AEROGENES    Report Status 07/01/2017 FINAL  Final   Organism ID, Bacteria ENTEROBACTER AEROGENES (A)  Final   Organism ID, Bacteria PSEUDOMONAS AERUGINOSA (A)  Final      Susceptibility   Enterobacter aerogenes - MIC*    CEFAZOLIN >=64 RESISTANT Resistant     CEFTRIAXONE <=1 SENSITIVE Sensitive     CIPROFLOXACIN <=0.25 SENSITIVE Sensitive     GENTAMICIN <=1 SENSITIVE Sensitive     IMIPENEM 2 SENSITIVE Sensitive     NITROFURANTOIN 128 RESISTANT Resistant     TRIMETH/SULFA <=20 SENSITIVE Sensitive     PIP/TAZO <=4 SENSITIVE Sensitive     * >=100,000 COLONIES/mL ENTEROBACTER AEROGENES   Pseudomonas aeruginosa - MIC*    CEFTAZIDIME 4 SENSITIVE Sensitive     CIPROFLOXACIN <=0.25 SENSITIVE Sensitive     GENTAMICIN <=1 SENSITIVE Sensitive     IMIPENEM 1 SENSITIVE Sensitive     PIP/TAZO 8 SENSITIVE Sensitive     CEFEPIME 2 SENSITIVE Sensitive     * >=100,000 COLONIES/mL PSEUDOMONAS AERUGINOSA         Radiology Studies: No results found.      Scheduled Meds: . acetylcysteine  2 mL Nebulization TID  . albuterol  2.5 mg Nebulization TID  . budesonide (PULMICORT) nebulizer solution  0.5 mg Nebulization BID  . docusate  100 mg Per Tube BID  . feeding supplement (JEVITY 1.5 CAL/FIBER)  355 mL Oral QID  . feeding supplement (PRO-STAT SUGAR FREE 64)  30 mL Per Tube Daily  . haloperidol lactate  2 mg Intravenous Once  . insulin aspart  0-9 Units Subcutaneous Q4H  . insulin glargine  20 Units Subcutaneous QHS   Continuous Infusions:   LOS: 33 days    Time spent: 25 mins    Walther Sanagustin Arsenio Loader, MD [facility] Pager [phone]   If 7PM-7AM, please contact night-coverage [email or web address] Password  [facility] 07/02/2017, 10:42 AM

## 2017-07-02 NOTE — Progress Notes (Signed)
  Speech Language Pathology Treatment: Cognitive-Linquistic  Patient Details Name: Vincent Hansen MRN: [id number] DOB: Jan 06, 1946 Today's Date: 07/02/2017 Time: 6599-3570 SLP Time Calculation (min) (ACUTE ONLY): 9 min  Assessment / Plan / Recommendation Clinical Impression  Pt was initially agreeable to try the electrolarynx today. SLP provided Min cues for pt to identify the button to turn on/off the vibration, as well as Mod cues to try to assist with timing of vibration and placement of electrolarynx. Pt was attempting to place it on the side of his jaw, and when cued on proper placement, pt quickly stopped participating. He wrote on his dry erase board that there was "too much going on today" and that he wanted to try another day. SLP provided Min cues to utilize the dry erase board to facilitate communication, but with pt's writing much more appropriate and comprehensible at the sentence level today. Will continue efforts.   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  Pt is s/p emergent trach and order for swallow/PMSV evaluations received. CXR showed Low lung volumes with areas of scarring and/or subsegmental atelectasis in the lower lobes of the lungs bilaterally.     Pt has been seen for education re: laryngectomy. Today provided with Electrolarynx to "practice" prior to scheduled procedure 06/16/2017.  Pt reports frustration at not being able "to speak" - educated him that due to his copious secretions and ? impact of XL trach  - he has been unable to use PMSV.  In addition, reeducated him to plan to have laryngectomy performed on Wednesday - therefore need to practice with electrolarynx.        SLP Plan  Continue with current plan of care       Recommendations                   Follow up Recommendations: Skilled Nursing facility SLP Visit  Diagnosis: Aphonia (R49.1) Plan: Continue with current plan of care       GO                Vincent Hansen 07/02/2017, 4:16 PM  Vincent Hansen, M.A. CCC-SLP [phone]

## 2017-07-02 NOTE — Progress Notes (Signed)
Physical Therapy Treatment Patient Details Name: Vincent Hansen MRN: [id number] DOB: [birth year] Today's Date: 07/02/2017    History of Present Illness 72 year old with PMHx: HTN, COPD, DM, prior history of squamous cell carcinoma of the larynx admitted with hypoxia and found to have large laryngeal mass obstructing airway status post emergency tracheostomy 05/30/2017 extubation on 05/31/2017. Pt with PEG. Total laryngectomy 4/3    PT Comments    Pt is up to walk with assistance and controlling the walker with some overreaching, corrects with cues.  He is quite tall but did adjust walker to help with the control of it.  He is appropriate to continue plan for SNF as safety and medical stability still dictate the need.  Will continue acutely for progression of gait and balance skills as tolerated, and to instill some safety with all movement.     Follow Up Recommendations  SNF;Supervision for mobility/OOB     Equipment Recommendations  Rolling walker with 5" wheels    Recommendations for Other Services       Precautions / Restrictions Precautions Precautions: Fall Precaution Comments: trach Restrictions Weight Bearing Restrictions: No    Mobility  Bed Mobility Overal bed mobility: Needs Assistance Bed Mobility: Supine to Sit     Supine to sit: Supervision        Transfers Overall transfer level: Needs assistance Equipment used: Rolling walker (2 wheeled) Transfers: Sit to/from Stand Sit to Stand: Min assist         General transfer comment: min assist to lightly power up  Ambulation/Gait Ambulation/Gait assistance: Min guard Ambulation Distance (Feet): 300 Feet(150 x 2) Assistive device: Rolling walker (2 wheeled) Gait Pattern/deviations: Step-through pattern;Wide base of support;Trunk flexed(overreaching with walker) Gait velocity: reduced Gait velocity interpretation: <1.31 ft/sec, indicative of household ambulator General Gait Details: room air and one moment  with elevated RR but remained at 93% and better throughout walk   Stairs             Wheelchair Mobility    Modified Rankin (Stroke Patients Only)       Balance Overall balance assessment: Needs assistance Sitting-balance support: Feet supported Sitting balance-Leahy Scale: Good     Standing balance support: Bilateral upper extremity supported Standing balance-Leahy Scale: Fair Standing balance comment: able to maintain balance with light walker support to walk and needs no support to stand                            Cognition Arousal/Alertness: Awake/alert Behavior During Therapy: WFL for tasks assessed/performed Overall Cognitive Status: Within Functional Limits for tasks assessed                                 General Comments: able to communicate needs and answer PT questions including his height      Exercises      General Comments        Pertinent Vitals/Pain Pain Assessment: No/denies pain    Home Living                      Prior Function            PT Goals (current goals can now be found in the care plan section) Acute Rehab PT Goals Patient Stated Goal: to walk and get more therapy Progress towards PT goals: Progressing toward goals    Frequency    Min  2X/week      PT Plan Current plan remains appropriate    Co-evaluation              AM-PAC PT "6 Clicks" Daily Activity  Outcome Measure  Difficulty turning over in bed (including adjusting bedclothes, sheets and blankets)?: None Difficulty moving from lying on back to sitting on the side of the bed? : A Little Difficulty sitting down on and standing up from a chair with arms (e.g., wheelchair, bedside commode, etc,.)?: A Little Help needed moving to and from a bed to chair (including a wheelchair)?: A Little Help needed walking in hospital room?: A Little Help needed climbing 3-5 steps with a railing? : A Little 6 Click Score: 19     End of Session Equipment Utilized During Treatment: Gait belt Activity Tolerance: Patient tolerated treatment well Patient left: in bed;with call bell/phone within reach;with nursing/sitter in room Nurse Communication: Mobility status PT Visit Diagnosis: Other abnormalities of gait and mobility (R26.89);Difficulty in walking, not elsewhere classified (R26.2)     Time: 9935-7017 PT Time Calculation (min) (ACUTE ONLY): 29 min  Charges:  $Gait Training: 8-22 mins $Therapeutic Activity: 8-22 mins                    G Codes:  Functional Assessment Tool Used: AM-PAC 6 Clicks Basic Mobility    Ramond Dial 07/02/2017, 10:18 AM   Mee Hives, PT MS Acute Rehab Dept. Number: [place] [facility] [place]

## 2017-07-02 NOTE — Progress Notes (Signed)
Patient was agitated and restless. Gave ordered PRN haldol.

## 2017-07-03 LAB — GLUCOSE, CAPILLARY
GLUCOSE-CAPILLARY: 143 mg/dL — AB (ref 65–99)
Glucose-Capillary: 110 mg/dL — ABNORMAL HIGH (ref 65–99)
Glucose-Capillary: 222 mg/dL — ABNORMAL HIGH (ref 65–99)
Glucose-Capillary: 236 mg/dL — ABNORMAL HIGH (ref 65–99)
Glucose-Capillary: 260 mg/dL — ABNORMAL HIGH (ref 65–99)
Glucose-Capillary: 80 mg/dL (ref 65–99)

## 2017-07-03 MED ORDER — GI COCKTAIL ~~LOC~~
30.0000 mL | Freq: Once | ORAL | Status: AC
  Administered 2017-07-03: 30 mL via ORAL
  Filled 2017-07-03: qty 30

## 2017-07-03 MED ORDER — ALBUTEROL SULFATE (2.5 MG/3ML) 0.083% IN NEBU
2.5000 mg | INHALATION_SOLUTION | Freq: Three times a day (TID) | RESPIRATORY_TRACT | Status: AC
  Administered 2017-07-03 – 2017-07-06 (×8): 2.5 mg via RESPIRATORY_TRACT
  Filled 2017-07-03 (×8): qty 3

## 2017-07-03 NOTE — Progress Notes (Signed)
Pt transitioned to tele-sitter from sitter status per MD request due to improvement in pt behavior.  Pt has been calm, cooperative all day.  No signs of agitation.  Does require frequent transfer from chair - bed and vice versa, but is easily redirected.  Follows commands, able to mouth date, president, situation appropriately.  Since placing pt on tele-sitter, pt has followed re-direction from tele-sitter prompts and is using call bell appropriately.  Bed is in low position and door open.

## 2017-07-03 NOTE — Plan of Care (Signed)
Mental status greatly improved today.  Pt calm, cooperative, following re-direction.

## 2017-07-03 NOTE — Progress Notes (Signed)
PROGRESS NOTE    Vincent Hansen  WCB:[id number] DOB: 1945-05-18 DOA: 05/30/2017 PCP: Patient, No Pcp Per   Brief Narrative:  72 year old with a history of essential hypertension, COPD, diabetes mellitus type 2, prior history of squamous cell carcinoma of his larynx came to the hospital in May 30, 2017 with hypoxia.  He was found to have a large laryngeal mass for which emergent tracheostomy was placed.  Patient also had a PEG tube placement for nutrition.  Laryngectomy was performed on June 16, 2017 by ENT and was transferred to their service.  TRH was consulted for medical management. [facility] was called on June 27, 2017 as patient had pulled out his laryngeal tube and became hypercapnic with concerns for mucous plugging and hypoxia.  Patient's airway was suctioned and his saturation improved therefore no further CPR was performed. At this point awaiting placement but patient does have episodes of agitation therefore psychiatry was consulted who recommended giving patient Haldol every 6 hours as necessary.  Patient has frequent episodes of agitation therefore sitter was ordered.   Assessment & Plan:   Principal Problem:   Squamous cell carcinoma of larynx (HCC) Active Problems:   Acute respiratory failure with hypoxia (HCC)   Status post tracheostomy (HCC)   Malnutrition of moderate degree   Laryngeal carcinoma (HCC)   DM2 (diabetes mellitus, type 2) (HCC)   HTN (hypertension)   Abnormal CT scan, stomach   Hiatal hernia   Tracheostomy status (HCC)   Periodontitis, chronic   S/P laryngectomy   Post-operative complication  Acute agitation and delirium; intermittent in nature  -Currently patient is getting Haldol as needed.  EKG is normal sinus rhythm without any prolonged QTC -Avoid using benzodiazepine, will discontinue. -If needed will get psych reeval again, advised nurses to change sitter to tele sitter. But can get sitter back again in the room if needed.   Acute  respiratory failure with hypoxia secondary to squamous cell carcinoma of his larynx status post laryngectomy -Management per ENT -At this time patient is a high risk of mucous plugging, Nebs around the clock - Provide supportive care.  Match input and output -Lasix if necessary  Essential hypertension -Blood pressure appears to be in relatively normal range.  Will closely monitor this  Diabetes mellitus type 2 -Lantus 20 units at bedtime, insulin sliding scale.  Blood glucose remains in stable range  COPD -Nebulizer treatments  Moderate protein calorie malnutrition -Currently PEG tube is in place.  Getting tube feeds.  DVT prophylaxis: Per primary team Code Status: Full code none at bedside Family Communication:   Disposition Plan: ok to discharge from medical standpoint when bed available.   Subjective: Intermittent confusion but communicated effective via dry erase board.   Review of Systems Otherwise negative except as per HPI, including: HEENT/EYES = negative for pain, redness, loss of vision, double vision, blurred vision, loss of hearing, sore throat, hoarseness, dysphagia Cardiovascular= negative for chest pain, palpitation, murmurs, lower extremity swelling Respiratory/lungs= negative for shortness of breath, cough, hemoptysis, wheezing, mucus production Gastrointestinal= negative for nausea, vomiting,, abdominal pain, melena, hematemesis Genitourinary= negative for Dysuria, Hematuria, Change in Urinary Frequency MSK = Negative for arthralgia, myalgias, Back Pain, Joint swelling  Neurology= Negative for headache, seizures, numbness, tingling  Psychiatry= Negative for anxiety, depression, suicidal and homocidal ideation Allergy/Immunology= Medication/Food allergy as listed  Skin= Negative for Rash, lesions, ulcers, itching   Objective: Vitals:   07/03/17 0338 07/03/17 0355 07/03/17 0811 07/03/17 0900  BP:  108/63  138/86  Pulse:  83  88  Resp:  17  (!) 23  Temp:   98.3 F (36.8 C)  98.1 F (36.7 C)  TempSrc:  Oral  Oral  SpO2: 97% 98% 98% 100%  Weight:      Height:        Intake/Output Summary (Last 24 hours) at 07/03/2017 1242 Last data filed at 07/03/2017 0900 Gross per 24 hour  Intake 880 ml  Output 450 ml  Net 430 ml   Filed Weights   06/30/17 0413 07/01/17 0434 07/02/17 1833  Weight: 77.1 kg (169 lb 15.6 oz) 74.8 kg (165 lb) 78 kg (172 lb)    Examination:  General exam: sitting in bed, awake . Using dry erase board to communicate.  Respiratory system: Clear to auscultation. Respiratory effort normal.  Has a trach in place Cardiovascular system: S1 & S2 heard, RRR. No JVD, murmurs, rubs, gallops or clicks. No pedal edema. Gastrointestinal system: Abdomen is nondistended, soft and nontender. No organomegaly or masses felt. Normal bowel sounds heard. Central nervous system: Alert and oriented. No focal neurological deficits. Extremities: Symmetric 5 x 5 power. Skin: No rashes, lesions or ulcers Psychiatry: Poor judgment, agitation    Data Reviewed:   CBC: Recent Labs  Lab 06/29/17 0236  WBC 5.0  NEUTROABS 3.4  HGB 10.4*  HCT 32.1*  MCV 81.3  PLT 161   Basic Metabolic Panel: Recent Labs  Lab 06/29/17 0236  NA 137  K 4.2  CL 100*  CO2 29  GLUCOSE 79  BUN 12  CREATININE 1.06  CALCIUM 9.2  MG 1.8   GFR: Estimated Creatinine Clearance: 70.2 mL/min (by C-G formula based on SCr of 1.06 mg/dL). Liver Function Tests: No results for input(s): AST, ALT, ALKPHOS, BILITOT, PROT, ALBUMIN in the last 168 hours. No results for input(s): LIPASE, AMYLASE in the last 168 hours. No results for input(s): AMMONIA in the last 168 hours. Coagulation Profile: No results for input(s): INR, PROTIME in the last 168 hours. Cardiac Enzymes: No results for input(s): CKTOTAL, CKMB, CKMBINDEX, TROPONINI in the last 168 hours. BNP (last 3 results) No results for input(s): PROBNP in the last 8760 hours. HbA1C: No results for input(s):  HGBA1C in the last 72 hours. CBG: Recent Labs  Lab 07/02/17 1651 07/02/17 1958 07/02/17 2331 07/03/17 0353 07/03/17 0800  GLUCAP 212* 185* 207* 80 110*   Lipid Profile: No results for input(s): CHOL, HDL, LDLCALC, TRIG, CHOLHDL, LDLDIRECT in the last 72 hours. Thyroid Function Tests: No results for input(s): TSH, T4TOTAL, FREET4, T3FREE, THYROIDAB in the last 72 hours. Anemia Panel: No results for input(s): VITAMINB12, FOLATE, FERRITIN, TIBC, IRON, RETICCTPCT in the last 72 hours. Sepsis Labs: No results for input(s): PROCALCITON, LATICACIDVEN in the last 168 hours.  Recent Results (from the past 240 hour(s))  Culture, blood (routine x 2)     Status: None   Collection Time: 06/25/17  2:40 PM  Result Value Ref Range Status   Specimen Description BLOOD RIGHT ANTECUBITAL  Final   Special Requests   Final    BOTTLES DRAWN AEROBIC AND ANAEROBIC Blood Culture adequate volume   Culture   Final    NO GROWTH 5 DAYS Performed at [facility], [address] [place], [place] [address]    Report Status 06/30/2017 FINAL  Final  Culture, blood (routine x 2)     Status: None   Collection Time: 06/25/17  2:40 PM  Result Value Ref Range Status   Specimen Description BLOOD RIGHT ANTECUBITAL  Final   Special Requests   Final    BOTTLES DRAWN AEROBIC AND ANAEROBIC Blood Culture adequate volume   Culture   Final    NO GROWTH 5 DAYS Performed at [facility], [address] [place], [place] [address]    Report Status 06/30/2017 FINAL  Final  MRSA PCR Screening     Status: None   Collection Time: 06/28/17  3:10 PM  Result Value Ref Range Status   MRSA by PCR NEGATIVE NEGATIVE Final    Comment:        The GeneXpert MRSA Assay (FDA approved for NASAL specimens only), is one component of a comprehensive MRSA colonization surveillance program. It is not intended to diagnose MRSA infection nor to guide or monitor treatment for MRSA infections. Performed at [facility], [place] [address], [place], [place] [address]   Culture, Urine     Status: Abnormal   Collection Time: 06/28/17  9:03 PM  Result Value Ref Range Status   Specimen Description URINE, CLEAN CATCH  Final   Special Requests   Final    NONE Performed at [facility], [place] [address] [place], [place] [address]    Culture (A)  Final    >=100,000 COLONIES/mL PSEUDOMONAS AERUGINOSA >=100,000 COLONIES/mL ENTEROBACTER AEROGENES    Report Status 07/01/2017 FINAL  Final   Organism ID, Bacteria ENTEROBACTER AEROGENES (A)  Final   Organism ID, Bacteria PSEUDOMONAS AERUGINOSA (A)  Final      Susceptibility   Enterobacter aerogenes - MIC*    CEFAZOLIN >=64 RESISTANT Resistant     CEFTRIAXONE <=1 SENSITIVE Sensitive     CIPROFLOXACIN <=0.25 SENSITIVE Sensitive     GENTAMICIN <=1 SENSITIVE Sensitive     IMIPENEM 2 SENSITIVE Sensitive     NITROFURANTOIN 128 RESISTANT Resistant     TRIMETH/SULFA <=20 SENSITIVE Sensitive     PIP/TAZO <=4 SENSITIVE Sensitive     * >=100,000 COLONIES/mL ENTEROBACTER AEROGENES   Pseudomonas aeruginosa - MIC*    CEFTAZIDIME 4 SENSITIVE Sensitive     CIPROFLOXACIN <=0.25 SENSITIVE Sensitive     GENTAMICIN <=1 SENSITIVE Sensitive     IMIPENEM 1 SENSITIVE Sensitive     PIP/TAZO 8 SENSITIVE Sensitive     CEFEPIME 2 SENSITIVE Sensitive     * >=100,000 COLONIES/mL PSEUDOMONAS AERUGINOSA         Radiology Studies: No results found.      Scheduled Meds: . acetylcysteine  2 mL Nebulization TID  . albuterol  2.5 mg Nebulization TID  . budesonide (PULMICORT) nebulizer solution  0.5 mg Nebulization BID  . docusate  100 mg Per Tube BID  . feeding supplement (JEVITY 1.5 CAL/FIBER)  355 mL Oral QID  . feeding supplement (PRO-STAT SUGAR FREE 64)  30 mL Per Tube Daily  . haloperidol lactate  2 mg Intravenous Once  . insulin aspart  0-9 Units Subcutaneous Q4H  . insulin glargine  20 Units Subcutaneous QHS   Continuous Infusions:   LOS: 34 days     Time spent: 20 mins    Ankit Arsenio Loader, MD [facility] Pager [phone]   If 7PM-7AM, please contact night-coverage [email or web address] Password [email or web address] 07/03/2017, 12:42 PM

## 2017-07-03 NOTE — Progress Notes (Signed)
07/03/2017 12:40 PM  Sermons, Vincent Hansen [id number]      Temp:  [97.8 F (36.6 C)-98.6 F (37 C)] 98.1 F (36.7 C) (04/20 0900) Pulse Rate:  [68-88] 88 (04/20 0900) Resp:  [16-23] 23 (04/20 0900) BP: (108-141)/(63-86) 138/86 (04/20 0900) SpO2:  [96 %-100 %] 100 % (04/20 0900) FiO2 (%):  [28 %-30 %] 28 % (04/20 0811) Weight:  [78 kg (172 lb)] 78 kg (172 lb) (04/19 1833),     Intake/Output Summary (Last 24 hours) at 07/03/2017 1240 Last data filed at 07/03/2017 0900 Gross per 24 hour  Intake 880 ml  Output 450 ml  Net 430 ml    Results for orders placed or performed during the hospital encounter of 05/30/17 (from the past 24 hour(s))  Glucose, capillary     Status: Abnormal   Collection Time: 07/02/17  4:51 PM  Result Value Ref Range   Glucose-Capillary 212 (H) 65 - 99 mg/dL  Glucose, capillary     Status: Abnormal   Collection Time: 07/02/17  7:58 PM  Result Value Ref Range   Glucose-Capillary 185 (H) 65 - 99 mg/dL  Glucose, capillary     Status: Abnormal   Collection Time: 07/02/17 11:31 PM  Result Value Ref Range   Glucose-Capillary 207 (H) 65 - 99 mg/dL   Comment 1 Notify RN   Glucose, capillary     Status: None   Collection Time: 07/03/17  3:53 AM  Result Value Ref Range   Glucose-Capillary 80 65 - 99 mg/dL  Glucose, capillary     Status: Abnormal   Collection Time: 07/03/17  8:00 AM  Result Value Ref Range   Glucose-Capillary 110 (H) 65 - 99 mg/dL   Comment 1 Notify RN     SUBJECTIVE:  Awake, alert.  Nurses report very little confusion.  Breathing comfortably.  Taking liquid diet without difficulty.  OBJECTIVE:  Laryngectomy tube in position clean and patent.  Mental status good with intelligible writing.  IMPRESSION:  Satisfactory check.  Awaiting placement  PLAN:  Voice rehabilitation.  Cont. Liquid diet.    [name]

## 2017-07-04 LAB — GLUCOSE, CAPILLARY
GLUCOSE-CAPILLARY: 190 mg/dL — AB (ref 65–99)
Glucose-Capillary: 113 mg/dL — ABNORMAL HIGH (ref 65–99)
Glucose-Capillary: 132 mg/dL — ABNORMAL HIGH (ref 65–99)
Glucose-Capillary: 158 mg/dL — ABNORMAL HIGH (ref 65–99)
Glucose-Capillary: 193 mg/dL — ABNORMAL HIGH (ref 65–99)
Glucose-Capillary: 82 mg/dL (ref 65–99)
Glucose-Capillary: 94 mg/dL (ref 65–99)

## 2017-07-04 NOTE — Progress Notes (Signed)
Pt refused feeding supplement (Jevity).

## 2017-07-04 NOTE — Progress Notes (Signed)
Pt resting quietly at this time. No s/s of distress.  Will continue to monitor.  Tele sitter in place as well.  Pt given call light and encouraged to call with needs.

## 2017-07-04 NOTE — Progress Notes (Signed)
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  He was found to have a large laryngeal mass for which emergent tracheostomy was placed.  Patient also had a PEG tube placement for nutrition.  Laryngectomy was performed on June 16, 2017 by ENT and was transferred to their service.  TRH was consulted for medical management. CODE BLUE was called on June 27, 2017 as patient had pulled out his laryngeal tube and became hypercapnic with concerns for mucous plugging and hypoxia.  Patient's airway was suctioned and his saturation improved therefore no further CPR was performed. At this point awaiting placement but patient does have episodes of agitation therefore psychiatry was consulted who recommended giving patient Haldol every 6 hours as necessary.  Patient has frequent episodes of agitation therefore sitter was ordered.   Assessment & Plan:   Principal Problem:   Squamous cell carcinoma of larynx (HCC) Active Problems:   Acute respiratory failure with hypoxia (HCC)   Status post tracheostomy (HCC)   Malnutrition of moderate degree   Laryngeal carcinoma (HCC)   DM2 (diabetes mellitus, type 2) (HCC)   HTN (hypertension)   Abnormal CT scan, stomach   Hiatal hernia   Tracheostomy status (HCC)   Periodontitis, chronic   S/P laryngectomy   Post-operative complication  Acute agitation and delirium; intermittent in nature  -EKG is normal sinus rhythm with normal QTC.  Avoid benzodiazepine -Previously been evaluated by psychiatry.  Use Haldol as necessary.  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  Will closely monitor this  Diabetes mellitus type 2 -Lantus 20 units at bedtime, insulin sliding scale.  Blood glucose remains in stable range  COPD -Nebulizer treatments  Moderate protein calorie malnutrition -Currently PEG tube is in place.  Getting tube feeds.  DVT prophylaxis: Per primary team Code Status: Full code Family Communication: None at the bedside Disposition Plan: Okay to discharge from medical standpoint when bed is available  Subjective: Patient denies any complaints this morning.  He is awake alert.  Review of Systems Otherwise negative except as per HPI, including: HEENT/EYES = negative for pain, redness, loss of vision, double vision, blurred vision, loss of hearing, sore throat, hoarseness, dysphagia Cardiovascular= negative for chest pain, palpitation, murmurs, lower extremity swelling Respiratory/lungs= negative for shortness of breath, cough, hemoptysis, wheezing, mucus production Gastrointestinal= negative for nausea, vomiting,, abdominal pain, melena, hematemesis Genitourinary= negative for Dysuria, Hematuria, Change in Urinary Frequency MSK = Negative for arthralgia, myalgias, Back Pain, Joint swelling  Neurology= Negative for headache, seizures, numbness, tingling  Psychiatry= Negative for anxiety, depression, suicidal and homocidal ideation Allergy/Immunology= Medication/Food allergy as listed  Skin= Negative for Rash, lesions, ulcers, itching    Objective: Vitals:   07/04/17 0830 07/04/17 0855 07/04/17 0900 07/04/17 1100  BP: (!) 151/98   134/72  Pulse:   67 63  Resp: 16  14 17   Temp:    98.3 F (36.8 C)  TempSrc:    Oral  SpO2:  97%  100% 100%  Weight:      Height:        Intake/Output Summary (Last 24 hours) at 07/04/2017 1335 Last  data filed at 07/04/2017 0819 Gross per 24 hour  Intake 948 ml  Output 900 ml  Net 48 ml   Filed Weights   07/01/17 0434 07/02/17 1833 07/04/17 0348  Weight: 74.8 kg (165 lb) 78 kg (172 lb) 73 kg (161 lb)    Examination:  Constitutional: NAD, calm, comfortable, trach in place, nonverbal but communicates effectively.  Dry erase board Eyes: PERRL, lids and conjunctivae normal ENMT: Mucous membranes are moist. Posterior pharynx clear of any exudate or lesions.Normal dentition.  Neck: normal, supple, no masses, no thyromegaly Respiratory: Slightly coarse breath sounds throughout Cardiovascular: Regular rate and rhythm, no murmurs / rubs / gallops. No extremity edema. 2+ pedal pulses. No carotid bruits.  Abdomen: no tenderness, no masses palpated. No hepatosplenomegaly. Bowel sounds positive.  Musculoskeletal: no clubbing / cyanosis. No joint deformity upper and lower extremities. Good ROM, no contractures. Normal muscle tone.  Skin: no rashes, lesions, ulcers. No induration Neurologic: CN 2-12 grossly intact. Sensation intact, DTR normal. Strength 5/5 in all 4.  Psychiatric: Normal judgment and insight. Alert and oriented x 3. Normal mood.      Data Reviewed:   CBC: Recent Labs  Lab 06/29/17 0236  WBC 5.0  NEUTROABS 3.4  HGB 10.4*  HCT 32.1*  MCV 81.3  PLT 237   Basic Metabolic Panel: Recent Labs  Lab 06/29/17 0236  NA 137  K 4.2  CL 100*  CO2 29  GLUCOSE 79  BUN 12  CREATININE 1.06  CALCIUM 9.2  MG 1.8   GFR: Estimated Creatinine Clearance: 66 mL/min (by C-G formula based on SCr of 1.06 mg/dL). Liver Function Tests: No results for input(s): AST, ALT, ALKPHOS, BILITOT, PROT, ALBUMIN in the last 168 hours. No results for input(s): LIPASE, AMYLASE in the last 168 hours. No results for input(s): AMMONIA in the last 168 hours. Coagulation Profile: No results for input(s): INR, PROTIME in the last 168 hours. Cardiac Enzymes: No results for input(s): CKTOTAL,  CKMB, CKMBINDEX, TROPONINI in the last 168 hours. BNP (last 3 results) No results for input(s): PROBNP in the last 8760 hours. HbA1C: No results for input(s): HGBA1C in the last 72 hours. CBG: Recent Labs  Lab 07/03/17 2331 07/04/17 0044 07/04/17 0437 07/04/17 0721 07/04/17 1127  GLUCAP 222* 132* 82 94 190*   Lipid Profile: No results for input(s): CHOL, HDL, LDLCALC, TRIG, CHOLHDL, LDLDIRECT in the last 72 hours. Thyroid Function Tests: No results for input(s): TSH, T4TOTAL, FREET4, T3FREE, THYROIDAB in the last 72 hours. Anemia Panel: No results for input(s): VITAMINB12, FOLATE, FERRITIN, TIBC, IRON, RETICCTPCT in the last 72 hours. Sepsis Labs: No results for input(s): PROCALCITON, LATICACIDVEN in the last 168 hours.  Recent Results (from the past 240 hour(s))  Culture, blood (routine x 2)     Status: None   Collection Time: 06/25/17  2:40 PM  Result Value Ref Range Status   Specimen Description BLOOD RIGHT ANTECUBITAL  Final   Special Requests   Final    BOTTLES DRAWN AEROBIC AND ANAEROBIC Blood Culture adequate volume   Culture   Final    NO GROWTH 5 DAYS Performed at [facility], [address], [place], [place] [address]    Report Status 06/30/2017 FINAL  Final  Culture, blood (routine x 2)     Status: None   Collection Time: 06/25/17  2:40 PM  Result Value Ref Range Status   Specimen Description BLOOD RIGHT ANTECUBITAL  Final   Special Requests   Final    BOTTLES DRAWN AEROBIC AND ANAEROBIC Blood Culture adequate volume   Culture   Final    NO GROWTH 5 DAYS Performed at [facility], [address], [place], [place] [address]    Report Status 06/30/2017 FINAL  Final  MRSA PCR Screening     Status: None   Collection Time: 06/28/17  3:10 PM  Result Value Ref Range Status   MRSA by PCR NEGATIVE NEGATIVE Final    Comment:        The GeneXpert MRSA Assay (FDA approved for NASAL specimens only), is one component of a comprehensive MRSA  colonization surveillance program. It is not intended to diagnose MRSA infection nor to guide or monitor treatment for MRSA infections. Performed at [facility], [place] [address] [place], [place] [address]   Culture, Urine     Status: Abnormal   Collection Time: 06/28/17  9:03 PM  Result Value Ref Range Status   Specimen Description URINE, CLEAN CATCH  Final   Special Requests   Final    NONE Performed at [facility], [place] [address], [place], [place] [address]    Culture (A)  Final    >=100,000 COLONIES/mL PSEUDOMONAS AERUGINOSA >=100,000 COLONIES/mL ENTEROBACTER AEROGENES    Report Status 07/01/2017 FINAL  Final   Organism ID, Bacteria ENTEROBACTER AEROGENES (A)  Final   Organism ID, Bacteria PSEUDOMONAS AERUGINOSA (A)  Final      Susceptibility   Enterobacter aerogenes - MIC*    CEFAZOLIN >=64 RESISTANT Resistant     CEFTRIAXONE <=1 SENSITIVE Sensitive     CIPROFLOXACIN <=0.25 SENSITIVE Sensitive     GENTAMICIN <=1 SENSITIVE Sensitive     IMIPENEM 2 SENSITIVE Sensitive     NITROFURANTOIN 128 RESISTANT Resistant     TRIMETH/SULFA <=20 SENSITIVE Sensitive     PIP/TAZO <=4 SENSITIVE Sensitive     * >=100,000 COLONIES/mL ENTEROBACTER AEROGENES   Pseudomonas aeruginosa - MIC*    CEFTAZIDIME 4 SENSITIVE Sensitive     CIPROFLOXACIN <=0.25 SENSITIVE Sensitive     GENTAMICIN <=1 SENSITIVE Sensitive     IMIPENEM 1 SENSITIVE Sensitive     PIP/TAZO 8 SENSITIVE Sensitive     CEFEPIME 2 SENSITIVE Sensitive     * >=100,000 COLONIES/mL PSEUDOMONAS AERUGINOSA         Radiology Studies: No results found.      Scheduled Meds: . albuterol  2.5 mg Nebulization TID  . budesonide (PULMICORT) nebulizer solution  0.5 mg Nebulization BID  . docusate  100 mg Per Tube BID  . feeding supplement (JEVITY 1.5 CAL/FIBER)  355 mL Oral QID  . feeding supplement (PRO-STAT SUGAR FREE 64)  30 mL Per Tube Daily  . haloperidol lactate  2 mg Intravenous Once  . insulin aspart  0-9  Units Subcutaneous Q4H  . insulin glargine  20 Units Subcutaneous QHS   Continuous Infusions:   LOS: 35 days    Time spent: 20 mins    Vincent Fluke Arsenio Loader, MD [facility] Pager [phone]   If 7PM-7AM, please contact night-coverage [email or web address] Password [email or web address] 07/04/2017, 1:35 PM

## 2017-07-04 NOTE — Progress Notes (Signed)
07/04/2017 10:55 AM  Pacitti, Rosalie Gums [id number]    Temp:  [98.2 F (36.8 C)-98.7 F (37.1 C)] 98.2 F (36.8 C) (04/21 0722) Pulse Rate:  [60-94] 67 (04/21 0900) Resp:  [11-18] 14 (04/21 0900) BP: (128-151)/(68-98) 151/98 (04/21 0830) SpO2:  [97 %-100 %] 100 % (04/21 0900) FiO2 (%):  [28 %] 28 % (04/21 0855) Weight:  [73 kg (161 lb)] 73 kg (161 lb) (04/21 0348),     Intake/Output Summary (Last 24 hours) at 07/04/2017 1055 Last data filed at 07/04/2017 0819 Gross per 24 hour  Intake 1525 ml  Output 900 ml  Net 625 ml    Results for orders placed or performed during the hospital encounter of 05/30/17 (from the past 24 hour(s))  Glucose, capillary     Status: Abnormal   Collection Time: 07/03/17 12:37 PM  Result Value Ref Range   Glucose-Capillary 236 (H) 65 - 99 mg/dL  Glucose, capillary     Status: Abnormal   Collection Time: 07/03/17  4:51 PM  Result Value Ref Range   Glucose-Capillary 143 (H) 65 - 99 mg/dL  Glucose, capillary     Status: Abnormal   Collection Time: 07/03/17  8:46 PM  Result Value Ref Range   Glucose-Capillary 260 (H) 65 - 99 mg/dL  Glucose, capillary     Status: Abnormal   Collection Time: 07/03/17 11:31 PM  Result Value Ref Range   Glucose-Capillary 222 (H) 65 - 99 mg/dL  Glucose, capillary     Status: Abnormal   Collection Time: 07/04/17 12:44 AM  Result Value Ref Range   Glucose-Capillary 132 (H) 65 - 99 mg/dL  Glucose, capillary     Status: None   Collection Time: 07/04/17  4:37 AM  Result Value Ref Range   Glucose-Capillary 82 65 - 99 mg/dL  Glucose, capillary     Status: None   Collection Time: 07/04/17  7:21 AM  Result Value Ref Range   Glucose-Capillary 94 65 - 99 mg/dL    SUBJECTIVE:  No pain.  Eating.  Breathing well.  OBJECTIVE:  Stoma and tube clean.  Writing well.  IMPRESSION:  Satisfactory check.  PLAN:  Speech.  Advance diet soon.  Activity.   [name]

## 2017-07-05 LAB — GLUCOSE, CAPILLARY
GLUCOSE-CAPILLARY: 148 mg/dL — AB (ref 65–99)
GLUCOSE-CAPILLARY: 111 mg/dL — AB (ref 65–99)
GLUCOSE-CAPILLARY: 178 mg/dL — AB (ref 65–99)
Glucose-Capillary: 127 mg/dL — ABNORMAL HIGH (ref 65–99)
Glucose-Capillary: 128 mg/dL — ABNORMAL HIGH (ref 65–99)

## 2017-07-05 NOTE — Progress Notes (Signed)
CSW met with pt and got number for [employer] who lives with pt (number in chart was originally wrong- contact number updated)  Vincent Hansen agreeable to take patient home as long as care needs not to great- CSW provided with Dr. Janeice Robinson number to discuss at MD request  Plan for Vincent Hansen to talk to Dr. Constance Holster to discuss possible DC home  CSW will continue to follow  Vincent Hansen, [place] Social Worker [phone]

## 2017-07-05 NOTE — Progress Notes (Signed)
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  He was found to have a large laryngeal mass for which emergent tracheostomy was placed.  Patient also had a PEG tube placement for nutrition.  Laryngectomy was performed on June 16, 2017 by ENT and was transferred to their service.  TRH was consulted for medical management. CODE BLUE was called on June 27, 2017 as patient had pulled out his laryngeal tube and became hypercapnic with concerns for mucous plugging and hypoxia.  Patient's airway was suctioned and his saturation improved therefore no further CPR was performed. At this point awaiting placement but patient does have episodes of agitation therefore psychiatry was consulted who recommended giving patient Haldol every 6 hours as necessary.  Patient has frequent episodes of agitation therefore sitter was ordered.   Assessment & Plan:   Principal Problem:   Squamous cell carcinoma of larynx (HCC) Active Problems:   Acute respiratory failure with hypoxia (HCC)   Status post tracheostomy (HCC)   Malnutrition of moderate degree   Laryngeal carcinoma (HCC)   DM2 (diabetes mellitus, type 2) (HCC)   HTN (hypertension)   Abnormal CT scan, stomach   Hiatal hernia   Tracheostomy status (HCC)   Periodontitis, chronic   S/P laryngectomy   Post-operative complication  Acute agitation and delirium; intermittent in nature, improved -EKG is normal sinus rhythm with normal QTC.  Avoid benzodiazepine -Previously been evaluated by psychiatry.  Use Haldol as necessary.  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  Nebulizers ordered as needed - Provide supportive care nebulizer treatments  Essential hypertension -Blood pressure appears to be in relatively normal range.  Will closely monitor this  Diabetes mellitus type 2 -Lantus 20 units at bedtime, insulin sliding scale.  Blood glucose remains in stable range  COPD -Nebulizer treatments  Moderate protein calorie malnutrition -Currently feeding via PEG tube  DVT prophylaxis: Per primary team Code Status: Full code Family Communication: None at the bedside Disposition Plan: Medically stable.  Subjective: Patient is awake alert and does not have any complaints. Patient wishes to go home  Review of Systems Otherwise negative except as per HPI, including: HEENT/EYES = negative for pain, redness, loss of vision, double vision, blurred vision, loss of hearing, sore throat, hoarseness, dysphagia Cardiovascular= negative for chest pain, palpitation, murmurs, lower extremity swelling Respiratory/lungs= negative for shortness of breath, cough, hemoptysis, wheezing, mucus production Gastrointestinal= negative for nausea, vomiting,, abdominal pain, melena, hematemesis Genitourinary= negative for Dysuria, Hematuria, Change in Urinary Frequency MSK = Negative for arthralgia, myalgias, Back Pain, Joint swelling  Neurology= Negative for headache, seizures, numbness, tingling  Psychiatry= Negative for anxiety, depression, suicidal and homocidal ideation Allergy/Immunology= Medication/Food allergy as listed  Skin= Negative for Rash, lesions, ulcers, itching     Objective: Vitals:   07/05/17 0347 07/05/17 0500 07/05/17 0753 07/05/17 1131  BP:  124/81    Pulse: 85 (!) 25 68 83  Resp: 18 15 18 20   Temp:      TempSrc:      SpO2: 95% 97% 97% 98%  Weight:  74.5 kg (164 lb 3.9 oz)  Height:        Intake/Output Summary (Last 24 hours) at 07/05/2017 1238 Last data filed at 07/05/2017 0900 Gross per 24  hour  Intake 595 ml  Output 750 ml  Net -155 ml   Filed Weights   07/04/17 0348 07/04/17 2331 07/05/17 0500  Weight: 73 kg (161 lb) 74.2 kg (163 lb 9.3 oz) 74.5 kg (164 lb 3.9 oz)    Examination:  Constitutional: NAD, calm, comfortable Eyes: PERRL, lids and conjunctivae normal ENMT: Mucous membranes are moist. Posterior pharynx clear of any exudate or lesions.Normal dentition.  Tracheostomy in place Neck: normal, supple, no masses, no thyromegaly Respiratory: Slightly diffuse coarse breath sounds Cardiovascular: Regular rate and rhythm, no murmurs / rubs / gallops. No extremity edema. 2+ pedal pulses. No carotid bruits.  Abdomen: no tenderness, no masses palpated. No hepatosplenomegaly. Bowel sounds positive.  Musculoskeletal: no clubbing / cyanosis. No joint deformity upper and lower extremities. Good ROM, no contractures. Normal muscle tone.  Skin: no rashes, lesions, ulcers. No induration Neurologic: CN 2-12 grossly intact. Sensation intact, DTR normal. Strength 5/5 in all 4.  Psychiatric: Normal judgment and insight. Alert and oriented x 3. Normal mood.       Data Reviewed:   CBC: Recent Labs  Lab 06/29/17 0236  WBC 5.0  NEUTROABS 3.4  HGB 10.4*  HCT 32.1*  MCV 81.3  PLT 371   Basic Metabolic Panel: Recent Labs  Lab 06/29/17 0236  NA 137  K 4.2  CL 100*  CO2 29  GLUCOSE 79  BUN 12  CREATININE 1.06  CALCIUM 9.2  MG 1.8   GFR: Estimated Creatinine Clearance: 67.4 mL/min (by C-G formula based on SCr of 1.06 mg/dL). Liver Function Tests: No results for input(s): AST, ALT, ALKPHOS, BILITOT, PROT, ALBUMIN in the last 168 hours. No results for input(s): LIPASE, AMYLASE in the last 168 hours. No results for input(s): AMMONIA in the last 168 hours. Coagulation Profile: No results for input(s): INR, PROTIME in the last 168 hours. Cardiac Enzymes: No results for input(s): CKTOTAL, CKMB, CKMBINDEX, TROPONINI in the last 168 hours. BNP (last 3 results) No  results for input(s): PROBNP in the last 8760 hours. HbA1C: No results for input(s): HGBA1C in the last 72 hours. CBG: Recent Labs  Lab 07/04/17 1921 07/04/17 2323 07/05/17 0433 07/05/17 0811 07/05/17 1204  GLUCAP 193* 113* 127* 148* 111*   Lipid Profile: No results for input(s): CHOL, HDL, LDLCALC, TRIG, CHOLHDL, LDLDIRECT in the last 72 hours. Thyroid Function Tests: No results for input(s): TSH, T4TOTAL, FREET4, T3FREE, THYROIDAB in the last 72 hours. Anemia Panel: No results for input(s): VITAMINB12, FOLATE, FERRITIN, TIBC, IRON, RETICCTPCT in the last 72 hours. Sepsis Labs: No results for input(s): PROCALCITON, LATICACIDVEN in the last 168 hours.  Recent Results (from the past 240 hour(s))  Culture, blood (routine x 2)     Status: None   Collection Time: 06/25/17  2:40 PM  Result Value Ref Range Status   Specimen Description BLOOD RIGHT ANTECUBITAL  Final   Special Requests   Final    BOTTLES DRAWN AEROBIC AND ANAEROBIC Blood Culture adequate volume   Culture   Final    NO GROWTH 5 DAYS Performed at [facility], [address], [place],  [address]    Report Status 06/30/2017 FINAL  Final  Culture, blood (routine x 2)     Status: None   Collection Time: 06/25/17  2:40 PM  Result Value Ref Range Status   Specimen Description  BLOOD RIGHT ANTECUBITAL  Final   Special Requests   Final    BOTTLES DRAWN AEROBIC AND ANAEROBIC Blood Culture adequate volume   Culture   Final    NO GROWTH 5 DAYS Performed at [facility], [address], [place], [place] [address]    Report Status 06/30/2017 FINAL  Final  MRSA PCR Screening     Status: None   Collection Time: 06/28/17  3:10 PM  Result Value Ref Range Status   MRSA by PCR NEGATIVE NEGATIVE Final    Comment:        The GeneXpert MRSA Assay (FDA approved for NASAL specimens only), is one component of a comprehensive MRSA colonization surveillance program. It is not intended to diagnose MRSA infection  nor to guide or monitor treatment for MRSA infections. Performed at [facility], [place] [address], [place], [place] [address]   Culture, Urine     Status: Abnormal   Collection Time: 06/28/17  9:03 PM  Result Value Ref Range Status   Specimen Description URINE, CLEAN CATCH  Final   Special Requests   Final    NONE Performed at [facility], [place] [address] [place], [place] [address]    Culture (A)  Final    >=100,000 COLONIES/mL PSEUDOMONAS AERUGINOSA >=100,000 COLONIES/mL ENTEROBACTER AEROGENES    Report Status 07/01/2017 FINAL  Final   Organism ID, Bacteria ENTEROBACTER AEROGENES (A)  Final   Organism ID, Bacteria PSEUDOMONAS AERUGINOSA (A)  Final      Susceptibility   Enterobacter aerogenes - MIC*    CEFAZOLIN >=64 RESISTANT Resistant     CEFTRIAXONE <=1 SENSITIVE Sensitive     CIPROFLOXACIN <=0.25 SENSITIVE Sensitive     GENTAMICIN <=1 SENSITIVE Sensitive     IMIPENEM 2 SENSITIVE Sensitive     NITROFURANTOIN 128 RESISTANT Resistant     TRIMETH/SULFA <=20 SENSITIVE Sensitive     PIP/TAZO <=4 SENSITIVE Sensitive     * >=100,000 COLONIES/mL ENTEROBACTER AEROGENES   Pseudomonas aeruginosa - MIC*    CEFTAZIDIME 4 SENSITIVE Sensitive     CIPROFLOXACIN <=0.25 SENSITIVE Sensitive     GENTAMICIN <=1 SENSITIVE Sensitive     IMIPENEM 1 SENSITIVE Sensitive     PIP/TAZO 8 SENSITIVE Sensitive     CEFEPIME 2 SENSITIVE Sensitive     * >=100,000 COLONIES/mL PSEUDOMONAS AERUGINOSA         Radiology Studies: No results found.      Scheduled Meds: . albuterol  2.5 mg Nebulization TID  . budesonide (PULMICORT) nebulizer solution  0.5 mg Nebulization BID  . docusate  100 mg Per Tube BID  . feeding supplement (JEVITY 1.5 CAL/FIBER)  355 mL Oral QID  . feeding supplement (PRO-STAT SUGAR FREE 64)  30 mL Per Tube Daily  . haloperidol lactate  2 mg Intravenous Once  . insulin aspart  0-9 Units Subcutaneous Q4H  . insulin glargine  20 Units Subcutaneous QHS    Continuous Infusions:   LOS: 36 days    Time spent: 15 mins    Devine Klingel Arsenio Loader, MD [facility] Pager [phone]   If 7PM-7AM, please contact night-coverage [email or web address] Password [facility] 07/05/2017, 12:38 PM

## 2017-07-05 NOTE — Plan of Care (Signed)
Discussed plan of care with patient.  Emphasized the importance of using the call bell before getting out of the bed or chair.

## 2017-07-05 NOTE — Clinical Social Work Note (Signed)
Clinical Social Work Assessment  Patient Details  Name: Vincent Hansen MRN: [id number] Date of Birth: 05-22-1945  Date of referral:  07/05/17               Reason for consult:  Facility Placement                Permission sought to share information with:  Other(friend) Permission granted to share information::  Yes, Verbal Permission Granted  Name::     Minna Merritts  Agency::     Relationship::  friend/ primary support  Contact Information:     Housing/Transportation Living arrangements for the past 2 months:  [place] of Information:  Patient, Other (Comment Required)(friend) Patient Interpreter Needed:  None Criminal Activity/Legal Involvement Pertinent to Current Situation/Hospitalization:  No - Comment as needed Significant Relationships:  Friend Lives with:  Friends Do you feel safe going back to the place where you live?  Yes Need for family participation in patient care:  Yes (Comment)(will need supervision from friend)  Care giving concerns:  Pt lives at home with "live in girlfriend" who has been watching out for him.  Pt normally very independent with mobility and per friend he maintains his house fastidiously and runs his own errands.  Concerns with pt safety with current mental status.   Social Worker assessment / plan:  CSW spoke with pt at bedside concerning DC plan.  Patient anxious to leave the hospital- ready to return home.  CSW explained role in assisting with DC plan and making sure pt has safe plan for DC.  Pt unable to speak but answered questions appropriately using whiteboard.  Pt polite to CSW but became agitated with RN while CSW in the room.  Pt states that his live in girlfriend, Jannett Celestine, would be at home with him at DC- pt acknowledges that she works but states he also has a friend named Joey Scales who could assist him at home while Minna Merritts is at work.  CSW requested permission to call Minna Merritts to discuss- pt nodded consent.  CSW spoke with  Minna Merritts concerning patient plan for time of DC.  CSW explained barriers to placement through [place] (trach/ pt not having skilled needs).  CSW explained that MDs thinking pt would be stable to return home if he had consistent supervision.  Minna Merritts stated that she has known pt for 15 years and is motivated to assist as able.  Minna Merritts lives with pt but states pt normally very independent.  She has had some concerns with pt mental status as of late even prior to admission feeling as if pt mental status somewhat declining.    Minna Merritts states that she works 8-3pm everyday but works as a Administrator on a regular route and could stop back at home every morning around 10-10:30 to check in.  Minna Merritts also reports she has 1 month of vacation stored up and would be willing to take time off to be with pt 24/hr a day for when pt first returns home.  Employment status:  Retired Forensic scientist:  [facility] PT Recommendations:  [facility] / Referral to community resources:  [place]  Patient/Family's Response to care: Minna Merritts agreeable to to taking patient back home as long as not needing too much medical care from her.  Worried about long term plan but is motivated to have pt back home.  Patient/Family's Understanding of and Emotional Response to Diagnosis, Current Treatment, and Prognosis:  Unclear level of understanding from  pt who is intermittently agitated but pt friend very involved and seems to have good understanding of current condition.  Hopeful that she can take patient home safely.  Emotional Assessment Appearance:  Appears stated age Attitude/Demeanor/Rapport:  Combative Affect (typically observed):  Restless Orientation:  Oriented to Self, Oriented to Place, Oriented to  Time, Oriented to Situation Alcohol / Substance use:  Not Applicable Psych involvement (Current and /or in the community):     Discharge Needs  Concerns to be addressed:  Care  Coordination Readmission within the last 30 days:  No Current discharge risk:  Cognitively Impaired Barriers to Discharge:      Jorge Ny, LCSW 07/05/2017, 2:32 PM

## 2017-07-05 NOTE — Progress Notes (Signed)
Patient ID: Vincent Hansen, male   DOB: September 13, 1945, 72 y.o.   MRN: [id number] I just had a nice meeting with his friend, A Pi.  I instructed her on replacing and removing the laryngectomy tube.  I showed her how to clean out the stoma.  He is now on a soft diet so we can hold any tube feeds for now.  We will hold off on removing the gastrostomy feeding tube until we know for sure that his nutrition is good.  He can potentially go home as early as tomorrow.  I would like to set him up for some home nursing visits to assist in his care.  There is also the question of his dementia.  He may ultimately need a nursing home with dementia care.  We will plan for possible discharge tomorrow.

## 2017-07-05 NOTE — Progress Notes (Signed)
Patient ID: Vincent Hansen, male   DOB: [birth year], 72 y.o.   MRN: [id number] Doing well, seems more cooperative and more lucent.  Stoma looks excellent.  Tolerating liquids well.  We will advance to soft diet today.  We will keep his laryngectomy tube out for 1 or 2 hours at a time he tolerates that.  Continue to work on placement.  I will try to meet up with his lady friend to see if she might be able to help take care of him at home.  Otherwise awaiting placement at a nursing facility.

## 2017-07-06 ENCOUNTER — Encounter (HOSPITAL_COMMUNITY): Payer: Self-pay

## 2017-07-06 ENCOUNTER — Emergency Department (HOSPITAL_COMMUNITY): Payer: No Typology Code available for payment source

## 2017-07-06 ENCOUNTER — Emergency Department (HOSPITAL_COMMUNITY)
Admission: EM | Admit: 2017-07-06 | Discharge: 2017-07-06 | Disposition: A | Discharge status: Home / Self Care | Payer: No Typology Code available for payment source | Attending: Emergency Medicine | Admitting: Emergency Medicine

## 2017-07-06 DIAGNOSIS — Z430 Encounter for attention to tracheostomy: Secondary | ICD-10-CM | POA: Diagnosis not present

## 2017-07-06 DIAGNOSIS — Z87891 Personal history of nicotine dependence: Secondary | ICD-10-CM | POA: Insufficient documentation

## 2017-07-06 DIAGNOSIS — I10 Essential (primary) hypertension: Secondary | ICD-10-CM | POA: Insufficient documentation

## 2017-07-06 DIAGNOSIS — E119 Type 2 diabetes mellitus without complications: Secondary | ICD-10-CM | POA: Insufficient documentation

## 2017-07-06 LAB — GLUCOSE, CAPILLARY
GLUCOSE-CAPILLARY: 111 mg/dL — AB (ref 65–99)
Glucose-Capillary: 113 mg/dL — ABNORMAL HIGH (ref 65–99)
Glucose-Capillary: 115 mg/dL — ABNORMAL HIGH (ref 65–99)
Glucose-Capillary: 160 mg/dL — ABNORMAL HIGH (ref 65–99)

## 2017-07-06 MED ORDER — ALUM & MAG HYDROXIDE-SIMETH 200-200-20 MG/5ML PO SUSP
30.0000 mL | Freq: Once | ORAL | Status: AC
  Administered 2017-07-06: 30 mL
  Filled 2017-07-06: qty 30

## 2017-07-06 MED ORDER — QUETIAPINE FUMARATE 50 MG PO TABS
50.0000 mg | ORAL_TABLET | Freq: Once | ORAL | Status: AC
  Administered 2017-07-06: 50 mg
  Filled 2017-07-06: qty 1

## 2017-07-06 MED ORDER — CALCIUM CARBONATE ANTACID 500 MG PO CHEW: 2.0000 | CHEWABLE_TABLET | Freq: Once | ORAL | Status: DC

## 2017-07-06 NOTE — ED Notes (Signed)
[facility] called  

## 2017-07-06 NOTE — ED Triage Notes (Signed)
Pt to ED via EMS for yellow drainage around his trach stoma. Pt d/c from hospital this AM and reports that even after his caregiver changed his trach, there was drainage around the sire. Per EMS, pt noted to have thick mucus during hospital visit. NAD noted. Lung sounds clear and diminished. Pt denies SOB, CP, fever, or chills. Pt asking for TUMS and reports indigestion. EMS VS: 140/90, 100-106 HR, 98% on RA. Pt able to communicate by writing on paper.

## 2017-07-06 NOTE — Progress Notes (Signed)
Pt was able to walk with PT this afternoon and progression to  Home is expected.  Pt is positive with PT and worked well with O2 sats at 90% at end of walk but began to recover immediately.  Follow acutely as pt is in hospital to transition to next venue of care.    07/06/17 2100  PT Visit Information  Last PT Received On 07/06/17  Assistance Needed +1  History of Present Illness 72 year old with PMHx: HTN, COPD, DM, prior history of squamous cell carcinoma of the larynx admitted with hypoxia and found to have large laryngeal mass obstructing airway status post emergency tracheostomy 05/30/2017 extubation on 05/31/2017. Pt with PEG. Total laryngectomy 4/3  Subjective Data  Subjective writes that he is supposed to go without O2 for several hours  Patient Stated Goal to walk and get more therapy  Precautions  Precautions Fall  Precaution Comments trach  Restrictions  Weight Bearing Restrictions No  Pain Assessment  Pain Assessment No/denies pain  Cognition  Arousal/Alertness Awake/alert  Behavior During Therapy [facility] for tasks assessed/performed  Overall Cognitive Status Within Functional Limits for tasks assessed  General Comments writng on his whiteboard and mouthing words  Difficult to assess due to Impaired communication  Bed Mobility  Overal bed mobility Needs Assistance  Bed Mobility Supine to Sit  Supine to sit Supervision  General bed mobility comments cues for sequence with LUE assist to fully elevate trunk and tactile cues to move legs to EOB  Transfers  Overall transfer level Needs assistance  Equipment used Rolling walker (2 wheeled)  Transfers Sit to/from Stand  Sit to Stand Min guard  General transfer comment pt is standing without assistance  Ambulation/Gait  Ambulation/Gait assistance Min guard  Ambulation Distance (Feet) 180 Feet  Assistive device Rolling walker (2 wheeled)  Gait Pattern/deviations Step-through pattern;Wide base of support;Trunk flexed  Gait  velocity reduced  Gait velocity interpretation <1.31 ft/sec, indicative of household ambulator  Balance  Overall balance assessment Needs assistance  Sitting-balance support Feet supported  Sitting balance-Leahy Scale Good  Standing balance support Bilateral upper extremity supported  Standing balance-Leahy Scale Fair  General Comments  General comments (skin integrity, edema, etc.) Pt is in bed with O2 off and O2 sats are 97%  Exercises  Exercises Other exercises (LE strength is 4 to 4+ )  PT - End of Session  Equipment Utilized During Treatment Gait belt  Activity Tolerance Patient tolerated treatment well;Patient limited by fatigue  Patient left in bed;with call bell/phone within reach  Nurse Communication Mobility status   PT - Assessment/Plan  PT Plan Current plan remains appropriate  PT Visit Diagnosis Unsteadiness on feet (R26.81);Muscle weakness (generalized) (M62.81);Difficulty in walking, not elsewhere classified (R26.2)  PT Frequency (ACUTE ONLY) Min 2X/week  Follow Up Recommendations SNF  PT equipment Rolling walker with 5" wheels  AM-PAC PT "6 Clicks" Daily Activity Outcome Measure  Difficulty turning over in bed (including adjusting bedclothes, sheets and blankets)? 4  Difficulty moving from lying on back to sitting on the side of the bed?  4  Difficulty sitting down on and standing up from a chair with arms (e.g., wheelchair, bedside commode, etc,.)? 3  Help needed moving to and from a bed to chair (including a wheelchair)? 3  Help needed walking in hospital room? 3  Help needed climbing 3-5 steps with a railing?  3  6 Click Score 20  Mobility G Code  CJ  PT Goal Progression  Progress towards PT goals Progressing toward  goals  Acute Rehab PT Goals  PT Goal Formulation With patient  PT Time Calculation  PT Start Time (ACUTE ONLY) 1351  PT Stop Time (ACUTE ONLY) 1406  PT Time Calculation (min) (ACUTE ONLY) 15 min  PT G-Codes **NOT FOR INPATIENT CLASS**   Functional Assessment Tool Used AM-PAC 6 Clicks Basic Mobility  PT General Charges  $$ ACUTE PT VISIT 1 Visit  PT Treatments  $Gait Training 8-22 mins    Mee Hives, PT MS Acute Rehab Dept. Number: [place] [facility] [place]

## 2017-07-06 NOTE — ED Notes (Signed)
This RN had extensive phone conversation with Vincent Hansen lasting approx 20 minutes total. When Vincent Hansen notified pt stable and being d/c, Vincent Merritts reported to this RN that "this is a lot, and I don't have a lot of medical training and he just wasn't listening to me when we got home." Vincent Merritts reports that despite being spoken with in great detail about caring for patient, that she feels overwhelmed and that patient "would not sit still. And was taking the trash out and trying to do chores the whole time and wouldn't listen to me" This RN spoke with Dr Rex Kras and Vincent Hansen, [place] who agree that based on patient's Hansen condition, patient is stable to go home and after reading over all CSW notes the best plan of care would be to contact [facility] for nursing facility placement update tomorrow. Pt otherwise ambulatory with steady gait at this time, pt appears mildly agitated but states it is because he is "ready to go home" and "doesn't want to be here any longer." This RN spoke with Vincent Merritts and Dr Rex Kras about giving patient medication for agitation prior to d/c and both agreed to plan of care. Per [name], will be here to pick pt up in approx 1 hour (at about 1130).

## 2017-07-06 NOTE — Progress Notes (Addendum)
Per Dr. Constance Holster note he would like for patient to go home with [facility] visits, NCM contacted Dr. Constance Holster 's office spoke with his Nurse Olivia Mackie , informed her that need order in for [facility] ,she will have Dr. Constance Holster put this order in.  Also NCM called Daisy Floro at [phone] xt [phone] left message that pateint needs [facility]  Visits left my number for return call.  77 [facility] called [facility] , left message , then dialed operator , he connected me to Ms. Southern, NCM left vm for her to return call so we can get the Home RN set up for patient, NCM still awaiting call back.   1400 NCM received call from Horald Chestnut he said to fax information to [phone], NCM faxing now. Patient prefers [facility], this information was given to [facility].  4/24 9:28 Tomi Bamberger RN, BSN - [facility] called with Josem Kaufmann, and they are sending it to [facility].

## 2017-07-06 NOTE — Progress Notes (Signed)
Patient being discharged home with [name]. Discharge instructions given to patient/Vincent Hansen. Educated on stoma care/making follow up appts. Verbalized understanding. Patient discharged home with personal belongings.

## 2017-07-06 NOTE — ED Notes (Addendum)
Spoke with CSW regarding pt. [place] consult already in place. Minna Merritts aware [facility] tomorrow to get information on pt placement and home health. Minna Merritts verbalized understanding of plan. Pt A&Ox4 and competent to make decisions. Pt calm and cooperative at this time however pt continues to write "ready." Pt verbalized he would prefer to go by taxi then to wait for friend. Pt ambulatory with steady gait however wheeled to lobby by this RN and assisted into cab to ensure pt did not get tired. Cab voucher given to driver and address verified with driver and patient. Per [name], will meet patient at the house. Pt verbalized understanding of all d/c instructions and f/u information. Opportunity for questioning and answers provided. VSS. All belongings with pt at this time.

## 2017-07-06 NOTE — ED Provider Notes (Signed)
[facility] EMERGENCY DEPARTMENT Provider Note   CSN: [id number] Arrival date & time: 07/06/17  2008     History   Chief Complaint Chief Complaint  Patient presents with  . Tracheostomy Tube Change    Drainage    HPI Vincent Hansen is a 72 y.o. male.  72yo F w/ PMH including squamous cell carcinoma of the larynx status post recent total laryngectomy and trach, G-tube, T2DM, HTN who p/w trach drainage. The patient was just discharged from the hospital today after being hospitalized for over 1 month due to his laryngeal cancer status post recent total laryngectomy.  EMS reports that his caregiver changed his trach this evening and noted that there was drainage around the site which prompted her to call EMS.  The patient himself writes that she overreacted and he feels fine.  He denies SOB, breathing problems, fever, or pain. He reports indigestion and wants tums. He wants to go home.    The history is provided by the patient and a caregiver.    Past Medical History:  Diagnosis Date  . Diabetes mellitus without complication ([place])   . Hypertension     Patient Active Problem List   Diagnosis Date Noted  . Periodontitis, chronic 06/16/2017  . S/P laryngectomy 06/16/2017  . Post-operative complication 60/12/9321  . Squamous cell carcinoma of larynx ([place]) 06/09/2017  . Tracheostomy status ([place])   . Abnormal CT scan, stomach   . Hiatal hernia   . DM2 (diabetes mellitus, type 2) ([place]) 06/04/2017  . HTN (hypertension) 06/04/2017  . Larynx cancer ([place]) 06/03/2017  . Laryngeal carcinoma ([place])   . Malnutrition of moderate degree 06/01/2017  . Acute respiratory failure with hypoxia ([place])   . Status post tracheostomy [facility])     Past Surgical History:  Procedure Laterality Date  . ESOPHAGOGASTRODUODENOSCOPY (EGD) WITH PROPOFOL N/A 06/07/2017   Procedure: ESOPHAGOGASTRODUODENOSCOPY (EGD) WITH PROPOFOL;  Surgeon: Milus Banister, MD;  Location: [facility] ENDOSCOPY;   Service: Endoscopy;  Laterality: N/A;  . HERNIA REPAIR    . IR GASTROSTOMY TUBE MOD SED  06/09/2017  . LARYNGETOMY N/A 06/16/2017   Procedure: TOTAL LARYNGECTOMY;  Surgeon: [name], MD;  Location: [facility];  Service: ENT;  Laterality: N/A;  . TRACHEOSTOMY TUBE PLACEMENT N/A 05/30/2017   Procedure: TRACHEOSTOMY, LARYNGEAL MASS BIOPSY;  Surgeon: [name], MD;  Location: [facility] ORS;  Service: ENT;  Laterality: N/A;        Home Medications    Prior to Admission medications   Medication Sig Start Date End Date Taking? Authorizing Provider  albuterol (PROVENTIL HFA;VENTOLIN HFA) 108 (90 Base) MCG/ACT inhaler Inhale 1 puff into the lungs every 4 (four) hours as needed for wheezing or shortness of breath.    [provider]    Family History Family History  Problem Relation Age of Onset  . Cancer Neg Hx     Social History Social History   Tobacco Use  . Smoking status: Former Research scientist (life sciences)  . Smokeless tobacco: Never Used  Substance Use Topics  . Alcohol use: No  . Drug use: Never     Allergies   Patient has no known allergies.   Review of Systems Review of Systems UNABLE TO OBTAIN DUE TO PATIENT NON-VERBAL  Physical Exam Updated Vital Signs BP (!) 159/94   Pulse (!) 103   Temp 99.2 F (37.3 C) (Oral)   Resp 18   Ht 6' (1.829 m)   Wt 76.2 kg (168 lb)   SpO2 97%  BMI 22.78 kg/m   Physical Exam  Constitutional: He is oriented to person, place, and time. He appears well-developed. No distress.  Thin appearing male, ambulatory  HENT:  Head: Normocephalic and atraumatic.  Moist mucous membranes  Eyes: Pupils are equal, round, and reactive to light. Conjunctivae are normal.  Neck: Neck supple.  Trach in place w/ crusting surrounding, no active drainage and no bleeding  Cardiovascular: Normal rate, regular rhythm and normal heart sounds.  No murmur heard. Pulmonary/Chest: Effort normal and breath sounds normal.  Abdominal: Soft. Bowel sounds are normal. He  exhibits no distension. There is no tenderness.  Musculoskeletal: He exhibits no edema.  Neurological: He is alert and oriented to person, place, and time.  Fluent speech  Skin: Skin is warm and dry.  Psychiatric:  Mildly agitated  Nursing note and vitals reviewed.    ED Treatments / Results  Labs (all labs ordered are listed, but only abnormal results are displayed) Labs Reviewed - No data to display  EKG None  Radiology Dg Chest [facility] 1 View  Result Date: 07/06/2017 CLINICAL DATA:  Mucous plugging EXAM: PORTABLE CHEST 1 VIEW COMPARISON:  06/25/2017 FINDINGS: Cardiac shadow is within normal limits. Tracheostomy tube is noted in place. The lungs are clear bilaterally. No acute bony abnormality is noted. IMPRESSION: No acute abnormality noted. Electronically Signed   By: Inez Catalina M.D.   On: 07/06/2017 21:01    Procedures Procedures (including critical care time)  Medications Ordered in ED Medications  alum & mag hydroxide-simeth (MAALOX/MYLANTA) 200-200-20 MG/5ML suspension 30 mL (30 mLs Per Tube Given 07/06/17 2056)     Initial Impression / Assessment and Plan / ED Course  I have reviewed the triage vital signs and the nursing notes.  Pertinent  imaging results that were available during my care of the patient were reviewed by me and considered in my medical decision making (see chart for details).    Pt breathing comfortably on humidified air. He was discharged today and they noted secretions from trach at that time but no fevers, hypoxia, or other concerns to suggest infection.  He has reassuring vital signs here and does not want any work-up. CXR negative acute. I am reassured by appearance of trach currently. Have discussed return precautions. Nurse contacted his caregiver, girlfriend, who will transport patient home. Pt given seroquel for agitation/sundowning which has been an issue in the hospital according to documentation.   Final Clinical Impressions(s) / ED  Diagnoses   Final diagnoses:  Attention to tracheostomy tube [facility])    ED Discharge Orders    None       Patryce Depriest, Wenda Overland, MD 07/06/17 2235

## 2017-07-06 NOTE — Progress Notes (Signed)
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  He was found to have a large laryngeal mass for which emergent tracheostomy was placed.  Patient also had a PEG tube placement for nutrition.  Laryngectomy was performed on June 16, 2017 by ENT and was transferred to their service.  TRH was consulted for medical management. [facility] was called on June 27, 2017 as patient had pulled out his laryngeal tube and became hypercapnic with concerns for mucous plugging and hypoxia.  Patient's airway was suctioned and his saturation improved therefore no further CPR was performed. At this point awaiting placement but patient does have episodes of agitation therefore psychiatry was consulted who recommended giving patient Haldol every 6 hours as necessary.  Patient has frequent episodes of agitation therefore sitter was ordered but not it has resolved.    Assessment & Plan:   Principal Problem:   Squamous cell carcinoma of larynx (HCC) Active Problems:   Acute respiratory failure with hypoxia (HCC)   Status post tracheostomy (HCC)   Malnutrition of moderate degree   Laryngeal carcinoma (HCC)   DM2 (diabetes mellitus, type 2) (HCC)   HTN (hypertension)   Abnormal CT scan, stomach   Hiatal hernia   Tracheostomy status (HCC)   Periodontitis, chronic   S/P laryngectomy   Post-operative complication  Acute agitation and delirium; intermittent in nature, resolved -EKG is normal sinus rhythm with normal QTC.  Avoid benzodiazepine -Previously been evaluated by psychiatry.  Use Haldol as necessary.  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  Nebulizers ordered as needed - Provide supportive care nebulizer treatments. Management per ENT.  Essential hypertension -Blood pressure appears to be in relatively normal range.  Will closely monitor this  Diabetes mellitus type 2 -Lantus 20 units at bedtime, insulin sliding scale.  Blood glucose remains in stable range  COPD -Nebulizer treatments  Moderate protein calorie malnutrition -Currently feeding via PEG tube  DVT prophylaxis: Per primary team Code Status: Full code Family Communication: None at the bedside Disposition Plan: Stable from medicine persc  Subjective: No complaints this morning. He is eating his breakfast without any problems.  Review of Systems Otherwise negative except as per HPI, including: HEENT/EYES = negative for pain, redness, loss of vision, double vision, blurred vision, loss of hearing, sore throat, hoarseness, dysphagia Cardiovascular= negative for chest pain, palpitation, murmurs, lower extremity swelling Respiratory/lungs= negative for shortness of breath, cough, hemoptysis, wheezing, mucus production Gastrointestinal= negative for nausea, vomiting,, abdominal pain, melena, hematemesis Genitourinary= negative for Dysuria, Hematuria, Change in Urinary Frequency MSK = Negative for arthralgia, myalgias, Back Pain, Joint swelling  Neurology= Negative for headache, seizures, numbness, tingling  Psychiatry= Negative for anxiety, depression, suicidal and homocidal ideation Allergy/Immunology= Medication/Food allergy as listed  Skin= Negative for Rash, lesions, ulcers, itching      Objective: Vitals:   07/06/17 0404 07/06/17 0600 07/06/17 0718 07/06/17 0916  BP:   119/84   Pulse: 88  89 89  Resp: 18  19 20   Temp:   98.5 F (36.9 C)   TempSrc:   Oral   SpO2: 99%  92% 97%  Weight:  76.2 kg (168 lb)    Height:        Intake/Output Summary (Last 24 hours) at  07/06/2017 1129 Last data filed at 07/06/2017 1014 Gross per 24 hour  Intake 1080 ml  Output 1475 ml  Net -395 ml   Filed Weights   07/04/17 2331 07/05/17 0500 07/06/17 0600  Weight: 74.2 kg (163 lb 9.3 oz) 74.5 kg (164 lb 3.9 oz) 76.2 kg (168 lb)    Examination: Constitutional: NAD, calm, comfortable Eyes: PERRL, lids and conjunctivae normal ENMT: Mucous membranes are moist. Posterior pharynx clear of any exudate or lesions.Normal dentition. Tracheostomy in place Neck: normal, supple, no masses, no thyromegaly Respiratory: Slightly coarse BS  Cardiovascular: Regular rate and rhythm, no murmurs / rubs / gallops. No extremity edema. 2+ pedal pulses. No carotid bruits.  Abdomen: no tenderness, no masses palpated. No hepatosplenomegaly. Bowel sounds positive.  Musculoskeletal: no clubbing / cyanosis. No joint deformity upper and lower extremities. Good ROM, no contractures. Normal muscle tone.  Skin: no rashes, lesions, ulcers. No induration Neurologic: CN 2-12 grossly intact. Sensation intact, DTR normal. Strength 5/5 in all 4.  Psychiatric: Normal judgment and insight. Alert and oriented x 3. Normal mood.     Data Reviewed:   CBC: No results for input(s): WBC, NEUTROABS, HGB, HCT, MCV, PLT in the last 168 hours. Basic Metabolic Panel: No results for input(s): NA, K, CL, CO2, GLUCOSE, BUN, CREATININE, CALCIUM, MG, PHOS in the last 168 hours. GFR: Estimated Creatinine Clearance: 68.9 mL/min (by C-G formula based on SCr of 1.06 mg/dL). Liver Function Tests: No results for input(s): AST, ALT, ALKPHOS, BILITOT, PROT, ALBUMIN in the last 168 hours. No results for input(s): LIPASE, AMYLASE in the last 168 hours. No results for input(s): AMMONIA in the last 168 hours. Coagulation Profile: No results for input(s): INR, PROTIME in the last 168 hours. Cardiac Enzymes: No results for input(s): CKTOTAL, CKMB, CKMBINDEX, TROPONINI in the last 168 hours. BNP (last 3 results) No results for  input(s): PROBNP in the last 8760 hours. HbA1C: No results for input(s): HGBA1C in the last 72 hours. CBG: Recent Labs  Lab 07/05/17 1655 07/05/17 2026 07/06/17 0022 07/06/17 0406 07/06/17 0718  GLUCAP 178* 128* 115* 113* 111*   Lipid Profile: No results for input(s): CHOL, HDL, LDLCALC, TRIG, CHOLHDL, LDLDIRECT in the last 72 hours. Thyroid Function Tests: No results for input(s): TSH, T4TOTAL, FREET4, T3FREE, THYROIDAB in the last 72 hours. Anemia Panel: No results for input(s): VITAMINB12, FOLATE, FERRITIN, TIBC, IRON, RETICCTPCT in the last 72 hours. Sepsis Labs: No results for input(s): PROCALCITON, LATICACIDVEN in the last 168 hours.  Recent Results (from the past 240 hour(s))  MRSA PCR Screening     Status: None   Collection Time: 06/28/17  3:10 PM  Result Value Ref Range Status   MRSA by PCR NEGATIVE NEGATIVE Final    Comment:        The GeneXpert MRSA Assay (FDA approved for NASAL specimens only), is one component of a comprehensive MRSA colonization surveillance program. It is not intended to diagnose MRSA infection nor to guide or monitor treatment for MRSA infections. Performed at [facility], [place] [address] [place], [place] [address]   Culture, Urine     Status: Abnormal   Collection Time: 06/28/17  9:03 PM  Result Value Ref Range Status   Specimen Description URINE, CLEAN CATCH  Final   Special Requests   Final    NONE Performed at [facility], [address] [place], [place] [address]    Culture (A)  Final    >=100,000 COLONIES/mL PSEUDOMONAS AERUGINOSA >=100,000 COLONIES/mL ENTEROBACTER AEROGENES    Report Status 07/01/2017 FINAL  Final   Organism ID, Bacteria ENTEROBACTER AEROGENES (A)  Final   Organism ID, Bacteria PSEUDOMONAS AERUGINOSA (A)  Final      Susceptibility   Enterobacter aerogenes - MIC*    CEFAZOLIN >=64 RESISTANT Resistant     CEFTRIAXONE <=1 SENSITIVE Sensitive     CIPROFLOXACIN <=0.25 SENSITIVE Sensitive      GENTAMICIN <=1 SENSITIVE Sensitive     IMIPENEM 2 SENSITIVE Sensitive     NITROFURANTOIN 128 RESISTANT Resistant     TRIMETH/SULFA <=20 SENSITIVE Sensitive     PIP/TAZO <=4 SENSITIVE Sensitive     * >=100,000 COLONIES/mL ENTEROBACTER AEROGENES   Pseudomonas aeruginosa - MIC*    CEFTAZIDIME 4 SENSITIVE Sensitive     CIPROFLOXACIN <=0.25 SENSITIVE Sensitive     GENTAMICIN <=1 SENSITIVE Sensitive     IMIPENEM 1 SENSITIVE Sensitive     PIP/TAZO 8 SENSITIVE Sensitive     CEFEPIME 2 SENSITIVE Sensitive     * >=100,000 COLONIES/mL PSEUDOMONAS AERUGINOSA         Radiology Studies: No results found.      Scheduled Meds: . albuterol  2.5 mg Nebulization TID  . budesonide (PULMICORT) nebulizer solution  0.5 mg Nebulization BID  . docusate  100 mg Per Tube BID  . feeding supplement (JEVITY 1.5 CAL/FIBER)  355 mL Oral QID  . feeding supplement (PRO-STAT SUGAR FREE 64)  30 mL Per Tube Daily  . haloperidol lactate  2 mg Intravenous Once  . insulin aspart  0-9 Units Subcutaneous Q4H  . insulin glargine  20 Units Subcutaneous QHS   Continuous Infusions:   LOS: 37 days    Time spent: 15 mins    Ankit Arsenio Loader, MD [facility] Pager [phone]   If 7PM-7AM, please contact night-coverage [email or web address] Password [facility] 07/06/2017, 11:29 AM

## 2017-07-06 NOTE — ED Notes (Signed)
Pt reports he DOES have someone to pick him up. This RN called [name] and cancelled [facility] pick up.

## 2017-07-06 NOTE — ED Notes (Signed)
ED Provider at bedside. 

## 2017-07-06 NOTE — ED Notes (Signed)
RT at bedside.

## 2017-07-06 NOTE — Plan of Care (Signed)
Discussed plan of care with patient.  Explained the importance of keeping his laryngeal tube  around his neck.  Several times he has removed it.

## 2017-07-06 NOTE — ED Notes (Signed)
Pt continues to call out and write that he would like to go home. Pt reports he has no one to take him home. This RN explained we would need to call [facility] to transport him home after d/c which could potentially take hours. Pt verbalized understanding however approx 10 mins later patient called out to request d/c and to go home. Delay explained to patient again.

## 2017-07-06 NOTE — ED Notes (Signed)
Pt wrote on sheet of paper, "Everybody's overreacting." Pt reports his caregiver called EMS because "she is just scared. Drake draining."

## 2017-07-06 NOTE — Discharge Instructions (Addendum)
KEEP TRACHEOSTOMY AREA CLEAN AND DRY. SUCTION AS NEEDED. FOLLOW ALL INSTRUCTIONS THAT YOU WERE GIVEN AT HOSPITAL DISCHARGE INCLUDING THE INSTRUCTIONS FROM THE EAR NOSE AND THROAT DOCTOR. RETURN TO ER IF YOU HAVE ANY BLEEDING FROM TRACH, FEVER, SHORTNESS OF BREATH, OR IF THE TRACH FALLS OUT.

## 2017-07-06 NOTE — Discharge Instructions (Signed)
Clean stoma daily.  Avoid allowing any water in stoma - do not submerge stoma under water.   Soft diet until follow up appointment with Dr. Constance Hansen  Keep the tube in but OK to remove periodically. Clean it out as needed.

## 2017-07-06 NOTE — ED Notes (Addendum)
Pt girlfriend and responsible party called again by this RN to follow up. No answer. Phone message attempted however mailbox full. CN notified

## 2017-07-06 NOTE — Progress Notes (Signed)
Patient ID: Vincent Hansen, male   DOB: [birth year], 72 y.o.   MRN: [id number] Asleep on rounds, doing well. Possible discharge today.

## 2017-07-06 NOTE — ED Notes (Signed)
XR at bedside

## 2017-07-06 NOTE — ED Notes (Signed)
Minna Merritts pt's friend called stating her car was broken down. Pt sent home by taxi. Minna Merritts stated that she will meet pt at house when she gets her car jumped.

## 2017-07-07 ENCOUNTER — Emergency Department (HOSPITAL_COMMUNITY)
Admission: EM | Admit: 2017-07-07 | Discharge: 2017-07-07 | Disposition: A | Discharge status: Home / Self Care | Payer: Non-veteran care | Source: Home / Self Care | Attending: Emergency Medicine | Admitting: Emergency Medicine

## 2017-07-07 ENCOUNTER — Emergency Department (HOSPITAL_COMMUNITY): Payer: Non-veteran care

## 2017-07-07 ENCOUNTER — Emergency Department (HOSPITAL_COMMUNITY)
Admission: EM | Admit: 2017-07-07 | Discharge: 2017-07-07 | Disposition: A | Discharge status: Home / Self Care | Payer: Non-veteran care | Attending: Emergency Medicine | Admitting: Emergency Medicine

## 2017-07-07 ENCOUNTER — Encounter (HOSPITAL_COMMUNITY): Payer: Self-pay | Admitting: Emergency Medicine

## 2017-07-07 ENCOUNTER — Encounter (HOSPITAL_COMMUNITY): Payer: Self-pay

## 2017-07-07 DIAGNOSIS — Z87891 Personal history of nicotine dependence: Secondary | ICD-10-CM

## 2017-07-07 DIAGNOSIS — J9500 Unspecified tracheostomy complication: Secondary | ICD-10-CM | POA: Diagnosis not present

## 2017-07-07 DIAGNOSIS — J9509 Other tracheostomy complication: Secondary | ICD-10-CM | POA: Insufficient documentation

## 2017-07-07 DIAGNOSIS — E119 Type 2 diabetes mellitus without complications: Secondary | ICD-10-CM | POA: Insufficient documentation

## 2017-07-07 DIAGNOSIS — Z79899 Other long term (current) drug therapy: Secondary | ICD-10-CM | POA: Insufficient documentation

## 2017-07-07 DIAGNOSIS — I10 Essential (primary) hypertension: Secondary | ICD-10-CM

## 2017-07-07 NOTE — Discharge Instructions (Addendum)
Follow-up with Dr. Constance Holster is some is possible for a new tube.  [facility] need [facility] with [facility].

## 2017-07-07 NOTE — ED Triage Notes (Signed)
Pt was brought back to the ED b/c pt removed his trach trying to clean it.  According to [facility] they and the police were called b/c pt had locked care given out of the house stating she was on drugs.  She states he has dementia and keeps pulling the trach out.  Per EMS they did clean thick mucus from the trach.  Pt is not in respiratory distress at this time.

## 2017-07-07 NOTE — ED Triage Notes (Signed)
Pt arrives via [facility], called by a friend who wanted to check on pt, found with no trach in and no lung sounds auscultated by [facility]; put on non-rebreather nebulizer over trach site; douneb given and still receiving on arrival. 79% on RA initially, 95% on arrival.

## 2017-07-07 NOTE — Progress Notes (Addendum)
CSW and CM met with pt. Pt visited ED earlier today and yesterday 4/23 after being discharged from inpatient yesterday, 4/23. Pt informed CSW and CM that no one is home with him to provide care. According to note, pt was supposed to received care from his friend, Minna Merritts. Pt informed staff that Minna Merritts is not capable of providing care and is no longer living with him. Pt is looking for SNF placement. Pt is unsafe at home. 3rd trip to the [facility] ED within 24 hours of inpatient discharge. PT order will be placed to re-assess pt. Physical therapy recommended SNF on 4/19.   Plan: Checking with registration to ensure that pt does not have Medicare and [place]. If pt does not have Medicare, pt will have to go to a [place] based facility.   CSW will continue to follow for discharge needs.   PASSR Number: [id number]  Update: CSW called Medicare provider line to see if pt has Medicare. [facility] Medicare provider line is only accessible Monday - Friday 8 am to 4:30 pm. Unknown if pt has Medicare.   ED evening CSW will leave handoff to ED daytime CSW to follow up with the [place] and Medicare to see what coverage pt has.   Pt has had multiple visits to the ED and feels unsafe at home.   Wendelyn Breslow, [facility]  [phone]

## 2017-07-07 NOTE — ED Notes (Signed)
Patient requested update - MD states patient is waiting on placement d/t frequency of visits and pt reporting to case management that he feels unsafe at home. Patient updated on plan of care and wrote down on paper that he refuses to stay here for much longer. MD made aware, at bedside now.

## 2017-07-07 NOTE — ED Provider Notes (Signed)
TIME SEEN: 5:13 AM  CHIEF COMPLAINT: Needs laryngostomy tube replaced  HPI: Patient is a 72 year old male with history of hypertension, diabetes, laryngeal squamous cell cancer currently status post laryngectomy on 06/16/2017 by Dr. Constance Holster who states tonight he felt like he could not breathe and pulled out his laryngostomy tube.  It appears Dr. Constance Holster placed in adjustable length size 6 tube.  Patient has this tube at bedside.  Per EMS, patient had locked his caregiver out of the house stating that she was on drugs.  Patient has history of dementia and reportedly continues to pull out his tube.   Patient denies chest pain or shortness of breath.  No secretions.  No fever.  He has no complaints at this time.  Asking for the tube to be replaced.  ROS: See HPI Constitutional: no fever  Eyes: no drainage  ENT: no runny nose   Cardiovascular:  no chest pain  Resp: no SOB  GI: no vomiting GU: no dysuria Integumentary: no rash  Allergy: no hives  Musculoskeletal: no leg swelling  Neurological: no slurred speech ROS otherwise negative  PAST MEDICAL HISTORY/PAST SURGICAL HISTORY:  Past Medical History:  Diagnosis Date  . Diabetes mellitus without complication ([place])   . Hypertension     MEDICATIONS:  Prior to Admission medications   Medication Sig Start Date End Date Taking? Authorizing Provider  albuterol (PROVENTIL HFA;VENTOLIN HFA) 108 (90 Base) MCG/ACT inhaler Inhale 1 puff into the lungs every 4 (four) hours as needed for wheezing or shortness of breath.    [provider]    ALLERGIES:  No Known Allergies  SOCIAL HISTORY:  Social History   Tobacco Use  . Smoking status: Former Research scientist (life sciences)  . Smokeless tobacco: Never Used  Substance Use Topics  . Alcohol use: No    FAMILY HISTORY: Family History  Problem Relation Age of Onset  . Cancer Neg Hx     EXAM: BP 117/74 (BP Location: Right Arm)   Pulse (!) 109   Temp 99.1 F (37.3 C) (Oral)   Resp 16   SpO2 96%   CONSTITUTIONAL: Alert and oriented and responds appropriately to questions.  Elderly, nonverbal HEAD: Normocephalic EYES: Conjunctivae clear, pupils appear equal, EOMI ENT: normal nose; moist mucous membranes, stoma looks great without surrounding redness or warmth and there is no drainage or secretions NECK: Supple, no meningismus, no nuchal rigidity, no LAD  CARD: RRR; S1 and S2 appreciated; no murmurs, no clicks, no rubs, no gallops RESP: Normal chest excursion without splinting or tachypnea; breath sounds clear and equal bilaterally; no wheezes, no rhonchi, no rales, no hypoxia or respiratory distress, speaking full sentences ABD/GI: Normal bowel sounds; non-distended; soft, non-tender, no rebound, no guarding, no peritoneal signs, no hepatosplenomegaly BACK:  The back appears normal and is non-tender to palpation, there is no CVA tenderness EXT: Normal ROM in all joints; non-tender to palpation; no edema; normal capillary refill; no cyanosis, no calf tenderness or swelling    SKIN: Normal color for age and race; warm; no rash NEURO: Moves all extremities equally PSYCH: The patient's mood and manner are appropriate. Grooming and personal hygiene are appropriate.  MEDICAL DECISION MAKING: Patient here after he pulled his laryngeal ostomy tube out.  He has a tube at bedtime.  I have cleaned it and lubricated it and replaced it.  He has no complaints at this time.  Tube went back and easily.  No signs of surrounding infection.  His lungs are clear.  Will obtain chest x-ray  to confirm placement and good aeration of his lungs.  Anticipate discharge home.  ED PROGRESS: Patient's chest x-ray shows no acute abnormality.  Tube is in place.  He has no complaints and no hypoxia or respiratory distress.  Patient ready for discharge home.  Discussed return precautions.  Trach care has been discussed.  It does appear that Dr. Constance Holster also had long discussion with patient's caregiver prior to discharge  yesterday as well.   At this time, I do not feel there is any life-threatening condition present. I have reviewed and discussed all results (EKG, imaging, lab, urine as appropriate) and exam findings with patient/family. I have reviewed nursing notes and appropriate previous records.  I feel the patient is safe to be discharged home without further emergent workup and can continue workup as an outpatient as needed. Discussed usual and customary return precautions. Patient/family verbalize understanding and are comfortable with this plan.  Outpatient follow-up has been provided if needed. All questions have been answered.      Kale Dols, Delice Bison, DO 07/07/17 [phone]

## 2017-07-07 NOTE — ED Provider Notes (Addendum)
[facility] EMERGENCY DEPARTMENT Provider Note   CSN: [id number] Arrival date & time: 07/07/17  1628     History   Chief Complaint Chief Complaint  Patient presents with  . Airway Obstruction    HPI Vincent Hansen is a 72 y.o. male.  HPI Patient presents with his laryngostomy tube removed.  Shortness of breath.  Reportedly pulled out at home.  Does have a history of dementia.  Presents in respiratory distress.  This is his third visit to the ER since being discharged from Dr. Constance Holster' services 24 hours ago.  Patient is nonverbal due to the laryngectomy. Past Medical History:  Diagnosis Date  . Diabetes mellitus without complication ([place])   . Hypertension     Patient Active Problem List   Diagnosis Date Noted  . Periodontitis, chronic 06/16/2017  . S/P laryngectomy 06/16/2017  . Post-operative complication 96/29/5284  . 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 Tracheostomy status ([place])   . Abnormal CT scan, stomach   . Hiatal hernia   . DM2 (diabetes mellitus, type 2) ([place]) 06/04/2017  . HTN (hypertension) 06/04/2017  . Larynx cancer ([place]) 06/03/2017  . Laryngeal carcinoma ([place])   . Malnutrition of moderate degree 06/01/2017  . Acute respiratory failure with hypoxia ([place])   . Status post tracheostomy [facility])     Past Surgical History:  Procedure Laterality Date  . ESOPHAGOGASTRODUODENOSCOPY (EGD) WITH PROPOFOL N/A 06/07/2017   Procedure: ESOPHAGOGASTRODUODENOSCOPY (EGD) WITH PROPOFOL;  Surgeon: Milus Banister, MD;  [facility];  Service: Endoscopy;  Laterality: N/A;  . HERNIA REPAIR    . IR GASTROSTOMY TUBE MOD SED  06/09/2017  . LARYNGETOMY N/A 06/16/2017   Procedure: TOTAL LARYNGECTOMY;  Surgeon: [name], MD;  Location: [facility];  Service: ENT;  Laterality: N/A;  . TRACHEOSTOMY TUBE PLACEMENT N/A 05/30/2017   Procedure: TRACHEOSTOMY, LARYNGEAL MASS BIOPSY;  Surgeon: [name], MD;  [facility];  Service: ENT;   Laterality: N/A;        Home Medications    Prior to Admission medications   Medication Sig Start Date End Date Taking? Authorizing Provider  albuterol (PROVENTIL HFA;VENTOLIN HFA) 108 (90 Base) MCG/ACT inhaler Inhale 1 puff into the lungs every 4 (four) hours as needed for wheezing or shortness of breath.    [provider]    Family History Family History  Problem Relation Age of Onset  . Cancer Neg Hx     Social History Social History   Tobacco Use  . Smoking status: Former Research scientist (life sciences)  . Smokeless tobacco: Never Used  Substance Use Topics  . Alcohol use: No  . Drug use: Never     Allergies   Patient has no known allergies.   Review of Systems Review of Systems  Unable to perform ROS: Patient nonverbal     Physical Exam Updated Vital Signs BP (!) 143/84   Pulse (!) 112   Resp (!) 22   Ht 6' (1.829 m)   Wt 76.2 kg (168 lb)   SpO2 98%   BMI 22.78 kg/m   Physical Exam  Constitutional: He appears well-developed.  HENT:  Head: Normocephalic.  Neck:  Patient with stoma on neck occluded.  No tube in place.  Cardiovascular:  Tachycardia  Pulmonary/Chest:  Decreased air movement throughout.  Musculoskeletal: He exhibits no edema.  Neurological:  Patient is in respiratory distress.  Will nod yes or no to some questions.  Skin: Skin is warm. Capillary refill takes less than 2  seconds.     ED Treatments / Results  Labs (all labs ordered are listed, but only abnormal results are displayed) Labs Reviewed - No data to display  EKG None  Radiology Dg Chest [facility] 1 View  Result Date: 07/07/2017 CLINICAL DATA:  Tracheostomy replacement. EXAM: PORTABLE CHEST 1 VIEW COMPARISON:  07/06/2017 FINDINGS: A minimally radiopaque tracheostomy tube is demonstrated. It is positioned at the midline in an appropriate appearing location. Shallow inspiration with mild linear atelectasis in the lung bases. Heart size and pulmonary vascularity are normal. No airspace  disease or consolidation in the lungs. No blunting of costophrenic angles. No pneumothorax. IMPRESSION: Minimally radiopaque tracheostomy tube appears to be in appropriate position. Linear atelectasis in the lung bases. No focal consolidation. Electronically Signed   By: Lucienne Capers M.D.   On: 07/07/2017 06:39   Dg Chest Port 1 View  Result Date: 07/06/2017 CLINICAL DATA:  Mucous plugging EXAM: PORTABLE CHEST 1 VIEW COMPARISON:  06/25/2017 FINDINGS: Cardiac shadow is within normal limits. Tracheostomy tube is noted in place. The lungs are clear bilaterally. No acute bony abnormality is noted. IMPRESSION: No acute abnormality noted. Electronically Signed   By: Inez Catalina M.D.   On: 07/06/2017 21:01    Procedures Procedures (including critical care time)  Medications Ordered in ED Medications - No data to display   Initial Impression / Assessment and Plan / ED Course  I have reviewed the triage vital signs and the nursing notes.  Pertinent labs & imaging results that were available during my care of the patient were reviewed by me and considered in my medical decision making (see chart for details).     Patient in respiratory distress after his laryngostomy tube had been removed by the patient.  This is his third visit in 24 hours for the same since being discharged 24 hours ago.  He had tracheostomy tube placed.  I discussed with Dr. Wilburn Cornelia.  States can follow in the office for a replacement tube.  Tracheostomy tube will help with now.  However patient will be seen by care management as he may require placement since he reportedly had kicked his caregiver out saying that she was on drugs and has known caring for him at home.   Patient is been seen by care management social work.  They were attempting to arrange follow-up however patient refuses to stay.  Appears to have capacity to make a decision to leave.  States he can manage it at home.  Will discharge with think he has a high  likelihood of coming or pulling out his tube again.  Will need to follow-up with Dr. Constance Holster to get a new tube in the office. Final Clinical Impressions(s) / ED Diagnoses   Final diagnoses:  Tracheostomy complication, unspecified complication type [facility])    ED Discharge Orders    None       Davonna Belling, MD 07/07/17 1918    Davonna Belling, MD 07/07/17 2042

## 2017-07-07 NOTE — ED Notes (Signed)
Dr. Leonides Schanz applied pt.'s tracheostomy tube with RT assistance . Respirations unlabored .

## 2017-07-07 NOTE — NC FL2 (Deleted)
  [place] MEDICAID FL2 LEVEL OF CARE SCREENING TOOL     IDENTIFICATION  Patient Name: Vincent Hansen Birthdate: 03-29-1945 Sex: male Admission Date (Current Location): 07/07/2017  [facility] and [place] Number:  Herbalist and Address:  The [place]. [facility], [place] [address], [place], [place] [address]      Provider Number: [id number]  Attending Physician Name and Address:  Davonna Belling, MD  Relative Name and Phone Number:       Current Level of Care: Hospital Recommended Level of Care: [place] Prior Approval Number:    Date Approved/Denied: 07/07/17 PASRR Number: [id number]  Discharge Plan: SNF    Current Diagnoses: Patient Active Problem List   Diagnosis Date Noted  . Periodontitis, chronic 06/16/2017  . S/P laryngectomy 06/16/2017  . Post-operative complication 63/14/9702  . 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 Tracheostomy status ([place])   . Abnormal CT scan, stomach   . Hiatal hernia   . DM2 (diabetes mellitus, type 2) ([place]) 06/04/2017  . HTN (hypertension) 06/04/2017  . Larynx cancer ([place]) 06/03/2017  . Laryngeal carcinoma ([place])   . Malnutrition of moderate degree 06/01/2017  . Acute respiratory failure with hypoxia ([place])   . Status post tracheostomy ([place])     Orientation RESPIRATION BLADDER Height & Weight     Self, Time, Situation, Place  Tracheostomy Continent Weight: 168 lb (76.2 kg) Height:  6' (182.9 cm)  BEHAVIORAL SYMPTOMS/MOOD NEUROLOGICAL BOWEL NUTRITION STATUS      Continent Diet(See AVS)  AMBULATORY STATUS COMMUNICATION OF NEEDS Skin   Supervision Non-Verbally(Writes/ gestures) Other (Comment)(Peg Tube (G-Tube), Trach)                       Personal Care Assistance Level of Assistance  Feeding Bathing Assistance: Limited assistance Feeding assistance: Maximum assistance Dressing Assistance: Limited assistance     Functional Limitations Info  Speech     Speech Info:  Impaired    SPECIAL CARE FACTORS FREQUENCY  PT (By licensed PT), OT (By licensed OT)     PT Frequency: 5 OT Frequency: 5            Contractures      Additional Factors Info  Code Status, Allergies Code Status Info: Prior Allergies Info: No known allergies           Current Medications (07/07/2017):  This is the current hospital active medication list No current facility-administered medications for this encounter.    Current Outpatient Medications  Medication Sig Dispense Refill  . albuterol (PROVENTIL HFA;VENTOLIN HFA) 108 (90 Base) MCG/ACT inhaler Inhale 1 puff into the lungs every 4 (four) hours as needed for wheezing or shortness of breath.       Discharge Medications: Please see discharge summary for a list of discharge medications.  Relevant Imaging Results:  Relevant Lab Results:   Additional Information [id number]  Wendelyn Breslow, LCSW

## 2017-07-07 NOTE — Discharge Summary (Signed)
Physician Discharge Summary  Patient ID: Vincent Hansen MRN: [id number] DOB/AGE: Sep 23, 1945 72 y.o.  Admit date: 05/30/2017 Discharge date: 07/07/2017  Admission Diagnoses: Mental status change, respiratory distress, stridor  Discharge Diagnoses:  Principal Problem:   Squamous cell carcinoma of larynx (HCC) Active Problems:   Acute respiratory failure with hypoxia (HCC)   Status post tracheostomy (HCC)   Malnutrition of moderate degree   Laryngeal carcinoma (HCC)   DM2 (diabetes mellitus, type 2) (HCC)   HTN (hypertension)   Abnormal CT scan, stomach   Hiatal hernia   Tracheostomy status (HCC)   Periodontitis, chronic   S/P laryngectomy   Post-operative complication   Discharged Condition: fair  Hospital Course: He was admitted urgently and underwent emergent tracheostomy.  He was found to have a laryngeal tumor which was biopsied and confirmed a squamous cell carcinoma.  He underwent a total laryngectomy.  Following the laryngectomy his surgical course has been uneventful.  He has had problems with dementia and behavioral issues.  Consults: none  Significant Diagnostic Studies: none  Treatments: surgery: Emergent tracheostomy, total laryngectomy, gastrostomy tube placement  Discharge Exam: Blood pressure 119/84, pulse 100, temperature 98.5 F (36.9 C), temperature source Oral, resp. rate 18, height 6' (1.829 m), weight 76.2 kg (168 lb), SpO2 96 %. PHYSICAL EXAM: He is awake and alert.  Stoma is healthy and clear.  Neck looks excellent.  He is on a soft diet.  Disposition:   Discharge Instructions    DIET SOFT   Complete by:  As directed    Increase activity slowly   Complete by:  As directed      Allergies as of 07/06/2017   No Known Allergies     Medication List    TAKE these medications   albuterol 108 (90 Base) MCG/ACT inhaler Commonly known as:  PROVENTIL HFA;VENTOLIN HFA Inhale 1 puff into the lungs every 4 (four) hours as needed for wheezing or  shortness of breath.      Follow-up Information    [facility] Follow up.   Specialty:  [place] Why:  awaiting authorization from [place] for agency to come out to see patient. Contact information: [address] [place] [address] [phone]        [name], MD. Schedule an appointment as soon as possible for a visit in 1 week(s).   Specialty:  Otolaryngology Contact information: [address] [place] [address] [phone]           Signed: [name] 07/07/2017, 12:03 PM

## 2017-07-07 NOTE — Progress Notes (Signed)
4 cuffed shiley inserted in PT per ED physician  PT was unable to cough out secretions and was struggling for breath

## 2017-07-07 NOTE — NC FL2 (Signed)
  [place] MEDICAID FL2 LEVEL OF CARE SCREENING TOOL     IDENTIFICATION  Patient Name: Vincent Hansen Birthdate: 1945-06-15 Sex: male Admission Date (Current Location): 07/07/2017  [facility] and [place] Number:  Herbalist and Address:  The [place]. [facility], [place] [address] [place], [place] [address]      Provider Number: [id number]  Attending Physician Name and Address:  Davonna Belling, MD  Relative Name and Phone Number:       Current Level of Care: Hospital Recommended Level of Care: [place] Prior Approval Number:    Date Approved/Denied: 07/07/17 PASRR Number: [id number]  Discharge Plan: SNF    Current Diagnoses: Patient Active Problem List   Diagnosis Date Noted  . Periodontitis, chronic 06/16/2017  . S/P laryngectomy 06/16/2017  . Post-operative complication 49/70/2637  . 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 Tracheostomy status ([place])   . Abnormal CT scan, stomach   . Hiatal hernia   . DM2 (diabetes mellitus, type 2) ([place]) 06/04/2017  . HTN (hypertension) 06/04/2017  . Larynx cancer ([place]) 06/03/2017  . Laryngeal carcinoma ([place])   . Malnutrition of moderate degree 06/01/2017  . Acute respiratory failure with hypoxia ([place])   . Status post tracheostomy ([place])     Orientation RESPIRATION BLADDER Height & Weight     Self, Time, Situation, Place  Tracheostomy Continent Weight: 168 lb (76.2 kg) Height:  6' (182.9 cm)  BEHAVIORAL SYMPTOMS/MOOD NEUROLOGICAL BOWEL NUTRITION STATUS      Continent Diet(See AVS)  AMBULATORY STATUS COMMUNICATION OF NEEDS Skin   Supervision Non-Verbally(Writes/ gestures) Other (Comment)(Peg Tube (G-Tube), Trach)                       Personal Care Assistance Level of Assistance  Feeding Bathing Assistance: Limited assistance Feeding assistance: Maximum assistance Dressing Assistance: Limited assistance     Functional Limitations Info  Speech     Speech Info:  Impaired    SPECIAL CARE FACTORS FREQUENCY  PT (By licensed PT), OT (By licensed OT)     PT Frequency: 5 OT Frequency: 5            Contractures      Additional Factors Info  Code Status, Allergies Code Status Info: Prior Allergies Info: No known allergies           Current Medications (07/07/2017):  This is the current hospital active medication list No current facility-administered medications for this encounter.    Current Outpatient Medications  Medication Sig Dispense Refill  . albuterol (PROVENTIL HFA;VENTOLIN HFA) 108 (90 Base) MCG/ACT inhaler Inhale 1 puff into the lungs every 4 (four) hours as needed for wheezing or shortness of breath.       Discharge Medications: Please see discharge summary for a list of discharge medications.  Relevant Imaging Results:  Relevant Lab Results:   Additional Information [id number]  Wendelyn Breslow, LCSW

## 2017-07-07 NOTE — Progress Notes (Signed)
Per medical team, pt is requesting to leave. CSW updated SW leadership.   CSW made APS report with [place].   Wendelyn Breslow, [facility]  [phone]

## 2017-07-07 NOTE — ED Notes (Signed)
Patient requesting to go home, does not want [facility] to take him home. Spoke with family member, Montoria, who is supposed to come and take patient home.

## 2017-07-07 NOTE — ED Notes (Signed)
Care givers phone number Minna Merritts [phone]

## 2017-07-07 NOTE — ED Notes (Signed)
Patient refuses to stay and wait for placement. Patient verbalized understanding of risk of leaving and follow-up information. Patient still wanting to go home, refusing [facility] and called family member, Montoria to come pick him up and take him home. Patient given paper scrubs and assisted to ED lobby via wheelchair. VSS, room air sat 94%.

## 2017-07-25 ENCOUNTER — Emergency Department (HOSPITAL_COMMUNITY)
Admission: EM | Admit: 2017-07-25 | Discharge: 2017-07-25 | Disposition: A | Discharge status: Home / Self Care | Payer: Non-veteran care | Attending: Emergency Medicine | Admitting: Emergency Medicine

## 2017-07-25 ENCOUNTER — Encounter (HOSPITAL_COMMUNITY): Payer: Self-pay | Admitting: Emergency Medicine

## 2017-07-25 ENCOUNTER — Other Ambulatory Visit: Payer: Self-pay

## 2017-07-25 DIAGNOSIS — K9423 Gastrostomy malfunction: Secondary | ICD-10-CM

## 2017-07-25 DIAGNOSIS — I10 Essential (primary) hypertension: Secondary | ICD-10-CM | POA: Diagnosis not present

## 2017-07-25 DIAGNOSIS — Z87891 Personal history of nicotine dependence: Secondary | ICD-10-CM | POA: Diagnosis not present

## 2017-07-25 DIAGNOSIS — E119 Type 2 diabetes mellitus without complications: Secondary | ICD-10-CM | POA: Insufficient documentation

## 2017-07-25 DIAGNOSIS — K9429 Other complications of gastrostomy: Secondary | ICD-10-CM | POA: Insufficient documentation

## 2017-07-25 DIAGNOSIS — K9420 Gastrostomy complication, unspecified: Secondary | ICD-10-CM | POA: Diagnosis present

## 2017-07-25 NOTE — ED Triage Notes (Addendum)
Patient presents to ED for assessment of a leak around his g-tube site.  Patient states he does not know anything about how to use it.  States he does not use it.  States it has been present for three weeks.  States he has a follow up appointment tomorrow with the GI doctor.  Denies pain.  Denies any other symptoms.  Patient has a trach and is unable to communicate except through written coomunication.  Patient also refuses to use notepad during questions, and this RN has difficulty reading his pantomime.  On visual assessment, tube is in place, discharge noted around the site, contents are filling the tubing as well.

## 2017-07-25 NOTE — ED Provider Notes (Signed)
[facility] Provider Note   CSN: [id number] Arrival date & time: 07/25/17  1439     History   Chief Complaint Chief Complaint  Patient presents with  . G-tube Dysfunction    HPI Vincent Hansen is a 72 y.o. male hx of DM, HTN, laryngeal cancer s/p trach and peg, here with peg tube dysfunction. Patient states that he is eating food already and hasn't been using his G tube for the last several months. He noticed that his G tube clotted and there is some leakage around it. Denies abdominal pain. Denies fever or drainage around the tube.   The history is provided by the patient.    Past Medical History:  Diagnosis Date  . Diabetes mellitus without complication ([place])   . Hypertension     Patient Active Problem List   Diagnosis Date Noted  . Periodontitis, chronic 06/16/2017  . S/P laryngectomy 06/16/2017  . Post-operative complication 40/34/7425  . 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 Tracheostomy status ([place])   . Abnormal CT scan, stomach   . Hiatal hernia   . DM2 (diabetes mellitus, type 2) ([place]) 06/04/2017  . HTN (hypertension) 06/04/2017  . Larynx cancer ([place]) 06/03/2017  . Laryngeal carcinoma ([place])   . Malnutrition of moderate degree 06/01/2017  . Acute respiratory failure with hypoxia ([place])   . Status post tracheostomy [facility])     Past Surgical History:  Procedure Laterality Date  . ESOPHAGOGASTRODUODENOSCOPY (EGD) WITH PROPOFOL N/A 06/07/2017   Procedure: ESOPHAGOGASTRODUODENOSCOPY (EGD) WITH PROPOFOL;  Surgeon: Milus Banister, MD;  [facility];  Service: Endoscopy;  Laterality: N/A;  . HERNIA REPAIR    . IR GASTROSTOMY TUBE MOD SED  06/09/2017  . LARYNGETOMY N/A 06/16/2017   Procedure: TOTAL LARYNGECTOMY;  Surgeon: [name], MD;  Location: [place];  Service: ENT;  Laterality: N/A;  . TRACHEOSTOMY TUBE PLACEMENT N/A 05/30/2017   Procedure: TRACHEOSTOMY, LARYNGEAL MASS BIOPSY;  Surgeon: [name], MD;  [facility];  Service: ENT;  Laterality: N/A;        Home Medications    Prior to Admission medications   Medication Sig Start Date End Date Taking? Authorizing Provider  albuterol (PROVENTIL HFA;VENTOLIN HFA) 108 (90 Base) MCG/ACT inhaler Inhale 1 puff into the lungs every 4 (four) hours as needed for wheezing or shortness of breath.    [provider]    Family History Family History  Problem Relation Age of Onset  . Cancer Neg Hx     Social History Social History   Tobacco Use  . Smoking status: Former Research scientist (life sciences)  . Smokeless tobacco: Never Used  Substance Use Topics  . Alcohol use: No  . Drug use: Never     Allergies   Patient has no known allergies.   Review of Systems Review of Systems  Gastrointestinal: Negative for abdominal pain.       G tube clotted   All other systems reviewed and are negative.    Physical Exam Updated Vital Signs BP 136/89 (BP Location: Right Arm)   Pulse (!) 103   Temp 98.2 F (36.8 C) (Oral)   Resp (!) 22   SpO2 100%   Physical Exam  Constitutional: He is oriented to person, place, and time. He appears well-nourished.  Chronically ill, NAD   HENT:  Head: Normocephalic.  Eyes: Pupils are equal, round, and reactive to light.  Neck:  Trach collar in place   Cardiovascular: Normal rate, regular rhythm  and normal heart sounds.  Pulmonary/Chest: Effort normal and breath sounds normal. No stridor. No respiratory distress.  Abdominal: Soft. Bowel sounds are normal. He exhibits no distension. There is no tenderness.  G tube clotted with tube feeds, no obvious surrounding erythema   Musculoskeletal: Normal range of motion.  Neurological: He is alert and oriented to person, place, and time.  Skin: Skin is warm.  Psychiatric: He has a normal mood and affect.  Nursing note and vitals reviewed.    ED Treatments / Results  Labs (all labs ordered are listed, but only abnormal results are displayed) Labs  Reviewed - No data to display  EKG None  Radiology No results found.  Procedures Irrigation Date/Time: 07/25/2017 5:09 PM Performed by: Drenda Freeze, MD Authorized by: Drenda Freeze, MD  Consent: Verbal consent obtained. Risks and benefits: risks, benefits and alternatives were discussed Consent given by: patient Patient understanding: patient states understanding of the procedure being performed Patient consent: the patient's understanding of the procedure matches consent given Patient identity confirmed: verbally with patient Time out: Immediately prior to procedure a "time out" was called to verify the correct patient, procedure, equipment, support staff and site/side marked as required. Preparation: Patient was prepped and draped in the usual sterile fashion. Local anesthesia used: no  Anesthesia: Local anesthesia used: no  Sedation: Patient sedated: no  Patient tolerance: Patient tolerated the procedure well with no immediate complications Comments: G tube irrigated with cola and then saline. I was able to declog the G tube     (including critical care time)  Medications Ordered in ED Medications - No data to display   Initial Impression / Assessment and Plan / ED Course  I have reviewed the triage vital signs and the nursing notes.  Pertinent labs & imaging results that were available during my care of the patient were reviewed by me and considered in my medical decision making (see chart for details).     Vincent Hansen is a 72 y.o. male here with G tube clogged. I was able to use cola and saline to declog the G tube. He hasn't been using it for awhile. I told him to talk to his doctor regarding when it can be removed.     Final Clinical Impressions(s) / ED Diagnoses   Final diagnoses:  None    ED Discharge Orders    None       Drenda Freeze, MD 07/25/17 1710

## 2017-07-25 NOTE — Discharge Instructions (Signed)
G tube is functioning now. Keep it clapped. Talked to your doctor regarding when it can be removed.   See your doctor for follow up   Return to ER if you have severe abdominal pain, vomiting, fever, G tube clogged

## 2017-07-25 NOTE — ED Notes (Signed)
G tube irrigated with coke, clog dislodged, g tube now patent; pt to be discharged and followed up with doctor

## 2017-07-26 ENCOUNTER — Emergency Department (HOSPITAL_COMMUNITY)
Admission: EM | Admit: 2017-07-26 | Discharge: 2017-07-26 | Disposition: A | Discharge status: Home / Self Care | Payer: Non-veteran care | Attending: Emergency Medicine | Admitting: Emergency Medicine

## 2017-07-26 ENCOUNTER — Encounter (HOSPITAL_COMMUNITY): Payer: Self-pay | Admitting: Emergency Medicine

## 2017-07-26 ENCOUNTER — Other Ambulatory Visit: Payer: Self-pay | Admitting: Otolaryngology

## 2017-07-26 DIAGNOSIS — I10 Essential (primary) hypertension: Secondary | ICD-10-CM | POA: Diagnosis not present

## 2017-07-26 DIAGNOSIS — C329 Malignant neoplasm of larynx, unspecified: Secondary | ICD-10-CM | POA: Insufficient documentation

## 2017-07-26 DIAGNOSIS — R0602 Shortness of breath: Secondary | ICD-10-CM | POA: Diagnosis not present

## 2017-07-26 DIAGNOSIS — E119 Type 2 diabetes mellitus without complications: Secondary | ICD-10-CM | POA: Insufficient documentation

## 2017-07-26 DIAGNOSIS — Z87891 Personal history of nicotine dependence: Secondary | ICD-10-CM | POA: Insufficient documentation

## 2017-07-26 DIAGNOSIS — Z430 Encounter for attention to tracheostomy: Secondary | ICD-10-CM | POA: Diagnosis present

## 2017-07-26 HISTORY — DX: Malignant (primary) neoplasm, unspecified: C80.1

## 2017-07-26 MED ORDER — ZIPRASIDONE HCL 80 MG PO CAPS: 80.0000 mg | ORAL_CAPSULE | Freq: Two times a day (BID) | ORAL | Status: DC

## 2017-07-26 MED ORDER — ZIPRASIDONE MESYLATE 20 MG IM SOLR: 20.0000 mg | Freq: Once | INTRAMUSCULAR | Status: DC

## 2017-07-26 NOTE — ED Notes (Signed)
Patients laryngeal tube in hand, respiratory placed temporary trach. Patient insistent on leaving. MD notified and working on discharge papers.

## 2017-07-26 NOTE — ED Provider Notes (Signed)
[facility] EMERGENCY DEPARTMENT Provider Note   CSN: [id number] Arrival date & time: 07/26/17  9528     History   Chief Complaint No chief complaint on file.   HPI Vincent Hansen is a 72 y.o. male.  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  He has a tracheostomy.  He presents today with complaints of his trach tube falling out.  He reports increased difficulty breathing since this occurred.  He denies any fevers or chills.  He denies any aggravating or alleviating factors.  It appears as though the rubber and that holds strap in place.  His trach was performed by Dr. Constance Holster from ENT.     Past Medical History:  Diagnosis Date  . Diabetes mellitus without complication ([place])   . Hypertension     Patient Active Problem List   Diagnosis Date Noted  . Periodontitis, chronic 06/16/2017  . S/P laryngectomy 06/16/2017  . Post-operative complication 41/32/4401  . Squamous cell carcinoma of larynx ([place]) 06/09/2017  . Tracheostomy status ([place])   . Abnormal CT scan, stomach   . Hiatal hernia   . DM2 (diabetes mellitus, type 2) ([place]) 06/04/2017  . HTN (hypertension) 06/04/2017  . Larynx cancer ([place]) 06/03/2017  . Laryngeal carcinoma ([place])   . Malnutrition of moderate degree 06/01/2017  . Acute respiratory failure with hypoxia ([place])   . Status post tracheostomy [facility])     Past Surgical History:  Procedure Laterality Date  . ESOPHAGOGASTRODUODENOSCOPY (EGD) WITH PROPOFOL N/A 06/07/2017   Procedure: ESOPHAGOGASTRODUODENOSCOPY (EGD) WITH PROPOFOL;  Surgeon: Milus Banister, MD;  Location: [facility];  Service: Endoscopy;  Laterality: N/A;  . HERNIA REPAIR    . IR GASTROSTOMY TUBE MOD SED  06/09/2017  . LARYNGETOMY N/A 06/16/2017   Procedure: TOTAL LARYNGECTOMY;  Surgeon: [name], MD;  Location: [facility];  Service: ENT;  Laterality: N/A;  . TRACHEOSTOMY TUBE PLACEMENT N/A 05/30/2017     Procedure: TRACHEOSTOMY, LARYNGEAL MASS BIOPSY;  Surgeon: [name], MD;  Location: [facility];  Service: ENT;  Laterality: N/A;        Home Medications    Prior to Admission medications   Medication Sig Start Date End Date Taking? Authorizing Provider  albuterol (PROVENTIL HFA;VENTOLIN HFA) 108 (90 Base) MCG/ACT inhaler Inhale 1 puff into the lungs every 4 (four) hours as needed for wheezing or shortness of breath.    [provider]    Family History Family History  Problem Relation Age of Onset  . Cancer Neg Hx     Social History Social History   Tobacco Use  . Smoking status: Former Research scientist (life sciences)  . Smokeless tobacco: Never Used  Substance Use Topics  . Alcohol use: No  . Drug use: Never     Allergies   Patient has no known allergies.   Review of Systems Review of Systems  All other systems reviewed and are negative.    Physical Exam Updated Vital Signs BP (!) 141/95 (BP Location: Right Arm)   Pulse 86   Temp 97.6 F (36.4 C) (Temporal)   Resp (!) 22   SpO2 98%   Physical Exam  Constitutional: He is oriented to person, place, and time. He appears well-developed and well-nourished. No distress.  HENT:  Head: Normocephalic and atraumatic.  Mouth/Throat: Oropharynx is clear and moist.  Neck: Normal range of motion. Neck supple.  Stoma appears clean.  There is no bleeding.  Cardiovascular: Normal  rate and regular rhythm. Exam reveals no friction rub.  No murmur heard. Pulmonary/Chest: Effort normal and breath sounds normal. No respiratory distress. He has no wheezes. He has no rales.  Abdominal: Soft. Bowel sounds are normal. He exhibits no distension. There is no tenderness.  Musculoskeletal: Normal range of motion. He exhibits no edema.  Neurological: He is alert and oriented to person, place, and time. Coordination normal.  Skin: Skin is warm and dry. He is not diaphoretic.  Nursing note and vitals reviewed.      ED Treatments / Results   Labs (all labs ordered are listed, but only abnormal results are displayed) Labs Reviewed - No data to display  EKG None  Radiology No results found.  Procedures Procedures (including critical care time)  Medications Ordered in ED Medications - No data to display   Initial Impression / Assessment and Plan / ED Course  I have reviewed the triage vital signs and the nursing notes.  Pertinent labs & imaging results that were available during my care of the patient were reviewed by me and considered in my medical decision making (see chart for details.)   In discussion with Dr. Janace Hoard, the tracheostomy tube the patient has been fell out is a special order.  A #4 Shiley will be placed in the interim.  The patient will follow-up this afternoon with Dr. Constance Holster.  Final Clinical Impressions(s) / ED Diagnoses   Final diagnoses:  None    ED Discharge Orders    None       Veryl Speak, MD 07/26/17 [phone]

## 2017-07-26 NOTE — Discharge Instructions (Signed)
Follow-up with Dr. Constance Holster this afternoon as scheduled.

## 2017-07-26 NOTE — ED Triage Notes (Signed)
Pt presents with broken Laryngeal tube out of stoma. Pt states while cleaning tube @5am  phalange on side broke, pt appears very anxious, does not have another tube at home.

## 2017-07-26 NOTE — ED Notes (Signed)
Pt frantically waving down staff, lary tube once again out of stoma, tube placed back in stoma.  Tube is a special order patient specific, unable to secure current tube d/t broken phlanage. Dr. Stark Jock to speak with ENT regarding how to proceed.

## 2017-07-26 NOTE — ED Notes (Signed)
Called to room again, pt attempting to expel secretions, RT called for suctioning.

## 2017-07-26 NOTE — ED Notes (Signed)
Materials management to send [employer]ox LaryTube to ED, unsure of size, will attempt #7

## 2017-08-10 ENCOUNTER — Encounter (HOSPITAL_COMMUNITY): Payer: Self-pay | Admitting: Radiology

## 2017-08-10 ENCOUNTER — Ambulatory Visit (HOSPITAL_COMMUNITY)
Admission: RE | Admit: 2017-08-10 | Discharge: 2017-08-10 | Disposition: A | Discharge status: Home / Self Care | Payer: Non-veteran care | Source: Ambulatory Visit | Attending: Otolaryngology | Admitting: Otolaryngology

## 2017-08-10 DIAGNOSIS — C329 Malignant neoplasm of larynx, unspecified: Secondary | ICD-10-CM

## 2017-08-10 DIAGNOSIS — Z431 Encounter for attention to gastrostomy: Secondary | ICD-10-CM | POA: Diagnosis not present

## 2017-08-10 DIAGNOSIS — Z8521 Personal history of malignant neoplasm of larynx: Secondary | ICD-10-CM | POA: Diagnosis not present

## 2017-08-10 HISTORY — PX: IR GASTROSTOMY TUBE REMOVAL: IMG5492

## 2017-08-10 MED ORDER — LIDOCAINE VISCOUS HCL 2 % MT SOLN
OROMUCOSAL | Status: AC
  Filled 2017-08-10: qty 15

## 2017-08-10 MED ORDER — LIDOCAINE VISCOUS HCL 2 % MT SOLN
OROMUCOSAL | Status: DC | PRN
  Administered 2017-08-10: 15 mL via OROMUCOSAL

## 2018-09-08 ENCOUNTER — Telehealth (HOSPITAL_COMMUNITY): Payer: Self-pay | Admitting: Rehabilitation

## 2018-09-08 NOTE — Telephone Encounter (Signed)

## 2018-09-09 ENCOUNTER — Encounter: Payer: Self-pay | Admitting: Vascular Surgery

## 2018-09-09 ENCOUNTER — Ambulatory Visit (INDEPENDENT_AMBULATORY_CARE_PROVIDER_SITE_OTHER): Payer: No Typology Code available for payment source | Admitting: Vascular Surgery

## 2018-09-09 ENCOUNTER — Encounter: Payer: Self-pay | Admitting: *Deleted

## 2018-09-09 ENCOUNTER — Other Ambulatory Visit: Payer: Self-pay

## 2018-09-09 ENCOUNTER — Other Ambulatory Visit: Payer: Self-pay | Admitting: *Deleted

## 2018-09-09 ENCOUNTER — Ambulatory Visit (HOSPITAL_COMMUNITY)
Admission: RE | Admit: 2018-09-09 | Discharge: 2018-09-09 | Disposition: A | Discharge status: Home / Self Care | Payer: No Typology Code available for payment source | Source: Ambulatory Visit | Attending: Vascular Surgery | Admitting: Vascular Surgery

## 2018-09-09 ENCOUNTER — Other Ambulatory Visit (HOSPITAL_COMMUNITY): Payer: Self-pay | Admitting: Vascular Surgery

## 2018-09-09 VITALS — BP 111/69 | HR 88 | Temp 97.8°F | Resp 20 | Ht 72.0 in | Wt 179.0 lb

## 2018-09-09 DIAGNOSIS — I739 Peripheral vascular disease, unspecified: Secondary | ICD-10-CM | POA: Insufficient documentation

## 2018-09-09 DIAGNOSIS — I723 Aneurysm of iliac artery: Secondary | ICD-10-CM | POA: Diagnosis not present

## 2018-09-09 NOTE — Progress Notes (Signed)
Patient ID: Vincent Hansen, male   DOB: 09-Apr-1945, 73 y.o.   MRN: [id number]  Reason for Consult: New Patient (Initial Visit)   Referred by Clinic, [facility]  Subjective:     HPI:  Vincent Hansen is a 73 y.o. male history of laryngectomy diabetes hypertension previous smoking.  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  Possibly a dissection of the left common iliac artery.  Also has foot pain which she describes is mostly at night and requires him to get up and walk.  He also has a small toe sore on the second toe of the right with some purple discoloration states that this was with minimal trauma cutting grass possibly barefooted last Tuesday.  Has not any fevers or chills.  Denies any stroke TIA or amaurosis.  Takes an aspirin statin daily does not take blood thinners.  Past Medical History:  Diagnosis Date  . Cancer ([place])    laryngeal  . Diabetes mellitus without complication ([place])   . Hypertension    Family History  Problem Relation Age of Onset  . Cancer Neg Hx    Past Surgical History:  Procedure Laterality Date  . ESOPHAGOGASTRODUODENOSCOPY (EGD) WITH PROPOFOL N/A 06/07/2017   Procedure: ESOPHAGOGASTRODUODENOSCOPY (EGD) WITH PROPOFOL;  Surgeon: Milus Banister, MD;  [facility];  Service: Endoscopy;  Laterality: N/A;  . HERNIA REPAIR    . IR GASTROSTOMY TUBE MOD SED  06/09/2017  . IR GASTROSTOMY TUBE REMOVAL  08/10/2017  . LARYNGETOMY N/A 06/16/2017   Procedure: TOTAL LARYNGECTOMY;  Surgeon: [name], MD;  Location: [facility];  Service: ENT;  Laterality: N/A;  . TRACHEOSTOMY TUBE PLACEMENT N/A 05/30/2017   Procedure: TRACHEOSTOMY, LARYNGEAL MASS BIOPSY;  Surgeon: [name], MD;  [facility];  Service: ENT;  Laterality: N/A;    Short Social History:  Social History   Tobacco Use  . Smoking status: Former Research scientist (life sciences)  . Smokeless tobacco: Never Used  Substance Use Topics  .  Alcohol use: No    Allergies  Allergen Reactions  . Baclofen Other (See Comments)    Please verify reaction type with patient,, reaction type not detailed in referral information from [facility]  . Hydrochlorothiazide Other (See Comments)    Please verify reaction type with patient,, reaction type not detailed in referral information from [facility]    Current Outpatient Medications  Medication Sig Dispense Refill  . albuterol (PROVENTIL HFA;VENTOLIN HFA) 108 (90 Base) MCG/ACT inhaler Inhale 1 puff into the lungs every 4 (four) hours as needed for wheezing or shortness of breath.    Marland Kitchen amLODipine (NORVASC) 10 MG tablet Take 10 mg by mouth daily.    Marland Kitchen aspirin 81 MG chewable tablet Chew 81 mg by mouth daily.    Marland Kitchen atorvastatin (LIPITOR) 10 MG tablet Take 10 mg by mouth daily.    . cilostazol (PLETAL) 100 MG tablet Take 100 mg by mouth 2 (two) times daily.    . empagliflozin (JARDIANCE) 25 MG TABS tablet Take 12.5 mg by mouth daily.    . famotidine (PEPCID) 20 MG tablet Take 20 mg by mouth 2 (two) times daily.    . ferrous sulfate 325 (65 FE) MG tablet Take 325 mg by mouth daily with breakfast.    . fluticasone (FLONASE) 50 MCG/ACT nasal spray Place into both nostrils daily.    Marland Kitchen glimepiride (AMARYL) 4 MG tablet Take 4 mg  by mouth daily with breakfast.    . loratadine (CLARITIN) 10 MG tablet Take 10 mg by mouth daily.    . metFORMIN (GLUCOPHAGE-XR) 750 MG 24 hr tablet Take 750 mg by mouth daily with breakfast.    . metoprolol succinate (TOPROL-XL) 25 MG 24 hr tablet Take 25 mg by mouth daily.    . pantoprazole (PROTONIX) 40 MG tablet Take 40 mg by mouth 2 (two) times daily.    . sildenafil (VIAGRA) 100 MG tablet Take 100 mg by mouth daily as needed for erectile dysfunction.    Marland Kitchen tiotropium (SPIRIVA) 18 MCG inhalation capsule Place 18 mcg into inhaler and inhale daily.    . traMADol (ULTRAM) 50 MG tablet Take by mouth.    . Vitamin D, Cholecalciferol, 25 MCG (1000 UT) TABS  Take by mouth.     No current facility-administered medications for this visit.     Review of Systems  Constitutional:  Constitutional negative. HENT: HENT negative.  Eyes: Eyes negative.  Respiratory: Respiratory negative.  GI: Gastrointestinal negative.  Musculoskeletal: Positive for leg pain.  Skin: Positive for wound.  Neurological: Neurological negative. Hematologic: Hematologic/lymphatic negative.  Psychiatric: Psychiatric negative.        Objective:  Objective   Vitals:   09/09/18 1134  BP: 111/69  Pulse: 88  Resp: 20  Temp: 97.8 F (36.6 C)  SpO2: 97%  Weight: 179 lb (81.2 kg)  Height: 6' (1.829 m)   Body mass index is 24.28 kg/m.  Physical Exam Constitutional:      Appearance: Normal appearance.  HENT:     Head: Normocephalic.     Nose: Nose normal.     Mouth/Throat:     Mouth: Mucous membranes are moist.  Eyes:     Pupils: Pupils are equal, round, and reactive to light.  Neck:     Vascular: No carotid bruit.  Cardiovascular:     Rate and Rhythm: Normal rate.  Pulmonary:     Effort: Pulmonary effort is normal.  Abdominal:     General: Abdomen is flat.     Palpations: There is no mass.  Musculoskeletal: Normal range of motion.     Comments: The right second toe has purple discoloration and a small less than 5 cm ulceration  Skin:    General: Skin is warm.     Capillary Refill: Capillary refill takes 2 to 3 seconds.  Neurological:     General: No focal deficit present.     Mental Status: He is alert.  Psychiatric:        Mood and Affect: Mood normal.        Thought Content: Thought content normal.        Judgment: Judgment normal.     Data: I have independently to read his ABIs to be 0.79 on the right and monophasic on the left are 1 and biphasic.  Toe pressure on the right 64 and left is 90.   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  I do not have the  images only to read from an MRI that was obtained to evaluate his prostate.  He does not have any history of aneurysms.  He does not have any carotid bruits or history of right second toe does have ulceration and is purple discolored with decreased ABI in the mild range.  Given this we will perform angiography from a left common femoral approach to evaluate both  the aneurysm on the right common iliac artery as well as the right lower extremity blood flow which I think is a more pressing issue at this time.  May need CT scan if aneurysm is to be repaired although we may be able to place a covered stent to exclude at the time of angiogram but I discussed with him that this is low likelihood.     Waynetta Sandy MD [facility]

## 2018-09-16 ENCOUNTER — Other Ambulatory Visit (HOSPITAL_COMMUNITY)
Admission: RE | Admit: 2018-09-16 | Discharge: 2018-09-16 | Disposition: A | Discharge status: Home / Self Care | Payer: No Typology Code available for payment source | Source: Ambulatory Visit | Attending: Vascular Surgery | Admitting: Vascular Surgery

## 2018-09-16 DIAGNOSIS — Z01812 Encounter for preprocedural laboratory examination: Secondary | ICD-10-CM | POA: Diagnosis present

## 2018-09-16 DIAGNOSIS — Z1159 Encounter for screening for other viral diseases: Secondary | ICD-10-CM | POA: Diagnosis not present

## 2018-09-16 LAB — SARS CORONAVIRUS 2 (TAT 6-24 HRS): SARS Coronavirus 2: NEGATIVE

## 2018-09-19 ENCOUNTER — Encounter (HOSPITAL_COMMUNITY)
Admission: RE | Disposition: A | Discharge status: Home / Self Care | Payer: Self-pay | Source: Home / Self Care | Attending: Vascular Surgery

## 2018-09-19 ENCOUNTER — Ambulatory Visit (HOSPITAL_COMMUNITY)
Admission: RE | Admit: 2018-09-19 | Discharge: 2018-09-19 | Disposition: A | Discharge status: Home / Self Care | Payer: No Typology Code available for payment source | Attending: Vascular Surgery | Admitting: Vascular Surgery

## 2018-09-19 ENCOUNTER — Other Ambulatory Visit: Payer: Self-pay

## 2018-09-19 DIAGNOSIS — Z888 Allergy status to other drugs, medicaments and biological substances status: Secondary | ICD-10-CM | POA: Insufficient documentation

## 2018-09-19 DIAGNOSIS — L97519 Non-pressure chronic ulcer of other part of right foot with unspecified severity: Secondary | ICD-10-CM | POA: Diagnosis not present

## 2018-09-19 DIAGNOSIS — I998 Other disorder of circulatory system: Secondary | ICD-10-CM | POA: Diagnosis not present

## 2018-09-19 DIAGNOSIS — I723 Aneurysm of iliac artery: Secondary | ICD-10-CM | POA: Insufficient documentation

## 2018-09-19 DIAGNOSIS — Z87891 Personal history of nicotine dependence: Secondary | ICD-10-CM | POA: Insufficient documentation

## 2018-09-19 DIAGNOSIS — Z7982 Long term (current) use of aspirin: Secondary | ICD-10-CM | POA: Diagnosis not present

## 2018-09-19 DIAGNOSIS — E11621 Type 2 diabetes mellitus with foot ulcer: Secondary | ICD-10-CM | POA: Insufficient documentation

## 2018-09-19 DIAGNOSIS — Z881 Allergy status to other antibiotic agents status: Secondary | ICD-10-CM | POA: Insufficient documentation

## 2018-09-19 DIAGNOSIS — I10 Essential (primary) hypertension: Secondary | ICD-10-CM | POA: Diagnosis not present

## 2018-09-19 DIAGNOSIS — Z7984 Long term (current) use of oral hypoglycemic drugs: Secondary | ICD-10-CM | POA: Insufficient documentation

## 2018-09-19 DIAGNOSIS — I70235 Atherosclerosis of native arteries of right leg with ulceration of other part of foot: Secondary | ICD-10-CM | POA: Insufficient documentation

## 2018-09-19 DIAGNOSIS — Z79899 Other long term (current) drug therapy: Secondary | ICD-10-CM | POA: Diagnosis not present

## 2018-09-19 HISTORY — PX: ABDOMINAL AORTOGRAM W/LOWER EXTREMITY: CATH118223

## 2018-09-19 LAB — GLUCOSE, CAPILLARY
Glucose-Capillary: 137 mg/dL — ABNORMAL HIGH (ref 70–99)
Glucose-Capillary: 90 mg/dL (ref 70–99)

## 2018-09-19 SURGERY — ABDOMINAL AORTOGRAM W/LOWER EXTREMITY
Anesthesia: LOCAL | Laterality: Right

## 2018-09-19 MED ORDER — OXYCODONE HCL 5 MG PO TABS: 5.0000 mg | ORAL_TABLET | ORAL | Status: DC | PRN

## 2018-09-19 MED ORDER — LABETALOL HCL 5 MG/ML IV SOLN: 10.0000 mg | INTRAVENOUS | Status: DC | PRN

## 2018-09-19 MED ORDER — ONDANSETRON HCL 4 MG/2ML IJ SOLN: 4.0000 mg | Freq: Four times a day (QID) | INTRAMUSCULAR | Status: DC | PRN

## 2018-09-19 MED ORDER — LIDOCAINE HCL (PF) 1 % IJ SOLN
INTRAMUSCULAR | Status: DC | PRN
  Administered 2018-09-19: 18 mL

## 2018-09-19 MED ORDER — MIDAZOLAM HCL 2 MG/2ML IJ SOLN
INTRAMUSCULAR | Status: AC
  Filled 2018-09-19: qty 2

## 2018-09-19 MED ORDER — IODIXANOL 320 MG/ML IV SOLN
INTRAVENOUS | Status: DC | PRN
  Administered 2018-09-19: 50 mL via INTRA_ARTERIAL

## 2018-09-19 MED ORDER — FENTANYL CITRATE (PF) 100 MCG/2ML IJ SOLN
INTRAMUSCULAR | Status: AC
  Filled 2018-09-19: qty 2

## 2018-09-19 MED ORDER — HYDRALAZINE HCL 20 MG/ML IJ SOLN: 5.0000 mg | INTRAMUSCULAR | Status: DC | PRN

## 2018-09-19 MED ORDER — SODIUM CHLORIDE 0.9 % IV SOLN: INTRAVENOUS | Status: DC

## 2018-09-19 MED ORDER — SODIUM CHLORIDE 0.9% FLUSH: 3.0000 mL | INTRAVENOUS | Status: DC | PRN

## 2018-09-19 MED ORDER — MORPHINE SULFATE (PF) 10 MG/ML IV SOLN: 2.0000 mg | INTRAVENOUS | Status: DC | PRN

## 2018-09-19 MED ORDER — FENTANYL CITRATE (PF) 100 MCG/2ML IJ SOLN
INTRAMUSCULAR | Status: DC | PRN
  Administered 2018-09-19: 25 ug via INTRAVENOUS

## 2018-09-19 MED ORDER — HEPARIN (PORCINE) IN NACL 1000-0.9 UT/500ML-% IV SOLN
INTRAVENOUS | Status: DC | PRN
  Administered 2018-09-19 (×2): 500 mL

## 2018-09-19 MED ORDER — SODIUM CHLORIDE 0.9 % IV SOLN: 250.0000 mL | INTRAVENOUS | Status: DC | PRN

## 2018-09-19 MED ORDER — ACETAMINOPHEN 325 MG PO TABS: 650.0000 mg | ORAL_TABLET | ORAL | Status: DC | PRN

## 2018-09-19 MED ORDER — SODIUM CHLORIDE 0.9% FLUSH: 3.0000 mL | Freq: Two times a day (BID) | INTRAVENOUS | Status: DC

## 2018-09-19 MED ORDER — HEPARIN (PORCINE) IN NACL 1000-0.9 UT/500ML-% IV SOLN
INTRAVENOUS | Status: AC
  Filled 2018-09-19: qty 1000

## 2018-09-19 MED ORDER — SODIUM CHLORIDE 0.9 % IV SOLN
INTRAVENOUS | Status: DC
  Administered 2018-09-19: 10:00:00 via INTRAVENOUS

## 2018-09-19 MED ORDER — LIDOCAINE HCL (PF) 1 % IJ SOLN
INTRAMUSCULAR | Status: AC
  Filled 2018-09-19: qty 30

## 2018-09-19 MED ORDER — MIDAZOLAM HCL 2 MG/2ML IJ SOLN
INTRAMUSCULAR | Status: DC | PRN
  Administered 2018-09-19: 1 mg via INTRAVENOUS

## 2018-09-19 SURGICAL SUPPLY — 17 items
CATH ANGIO 5F BER2 65CM (CATHETERS) ×2 IMPLANT
CATH NAVICROSS ST 65CM (CATHETERS) ×1 IMPLANT
CATH OMNI FLUSH 5F 65CM (CATHETERS) ×2 IMPLANT
CATHETER NAVICROSS ST 65CM (CATHETERS) ×2
CLOSURE MYNX CONTROL 5F (Vascular Products) ×2 IMPLANT
DEVICE TORQUE .025-.038 (MISCELLANEOUS) ×2 IMPLANT
FILTER CO2 0.2 MICRON (VASCULAR PRODUCTS) ×2 IMPLANT
GUIDEWIRE ANGLED .035X150CM (WIRE) ×2 IMPLANT
KIT MICROPUNCTURE NIT STIFF (SHEATH) ×2 IMPLANT
KIT PV (KITS) ×2 IMPLANT
RESERVOIR CO2 (VASCULAR PRODUCTS) ×2 IMPLANT
SET FLUSH CO2 (MISCELLANEOUS) ×2 IMPLANT
SHEATH PINNACLE 5F 10CM (SHEATH) ×2 IMPLANT
SHEATH PROBE COVER 6X72 (BAG) ×2 IMPLANT
TRANSDUCER W/STOPCOCK (MISCELLANEOUS) ×2 IMPLANT
TRAY PV CATH (CUSTOM PROCEDURE TRAY) ×2 IMPLANT
WIRE BENTSON .035X145CM (WIRE) ×2 IMPLANT

## 2018-09-19 NOTE — Progress Notes (Signed)
Cath lab called to update, pt has trach but no cuff in, cuff in blue bag. Veronica Therapist, sports received [employer].

## 2018-09-19 NOTE — Progress Notes (Signed)
Up to bathroom and tolerated well; left groin stable no bleeding or hematoma

## 2018-09-19 NOTE — Op Note (Signed)
    Patient name: Vincent Hansen MRN: [id number] DOB: 20-Dec-1945 Sex: male  09/19/2018 Pre-operative Diagnosis: Right common iliac artery aneurysm, right second toe ulceration with critical limb ischemia Post-operative diagnosis:  Same Surgeon:  Eda Paschal. Donzetta Matters, MD Procedure Performed: 1.  Ultrasound-guided cannulation left common femoral artery 2.  CO2 and contrast aortogram 3.  Selection of right common femoral artery and right lower extremity angiogram 4.  Minx device closure left common femoral artery 5.  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  At the same time he is found to have a second toe ulceration with decreased ABIs that were monophasic and a decreased toe pressure.  He subsequently indicated for angiogram.  At presentation his creatinine was 1.9 so CO2 would also be used.  Findings: He has heavily tortuous iliacs.  His aorta appears to be normal size.  There is a calcified common iliac artery aneurysm size cannot be determined by angiogram.  Peers to have a stenosis at least 50% of the origin of the external iliac artery just after the aneurysm.  There is possibly a neck of the aneurysm in the common iliac which could be repaired endovascularly.  The right lower extremity which was the only leg evaluated demonstrated occluded SFA with minimal collaterals reconstituted above-knee popliteal artery.  Runoff is dominant via the peroneal the posterior tibial also appears to be patent.  Anterior tibial is occluded after the takeoff.  Patient will be scheduled for right femoral to popliteal artery bypass.  He will get vein mapping and clearance from the [facility] hospital in [place].  It is possible that the bypass will need to be performed in [place] if it cannot be cleared through the [place] to be performed here.  He will also need a CT scan  with contrast to evaluate his iliac artery aneurysm but the most pressing issue is his occluded SFA with second toe ulcer.   Procedure:  The patient was identified in the holding area and taken to room 8.  The patient was then placed supine on the table and prepped and draped in the usual sterile fashion.  A time out was called.  Ultrasound was used to evaluate the left common femoral artery which was noted to be patent.  The area was anesthetized 1% lidocaine cannulated micropuncture needle followed by wire and sheath.  Bentson wire was placed followed by 5 [place] sheath.  Fentanyl and Versed were administered and his vital signs were monitored throughout this case.  Bare catheter was used to get the Bentson wire into the aorta.  Aortogram was performed first with CO2 followed by pulled down CO2 again.  At completion we did use 20 cc of contrast for the aortogram.  We crossed the bifurcation using Glidewire and Nava cross catheter.  Perform right lower extremity angios both with CO2 and then with contrast distally.  He appears to reconstitute above-knee popliteal artery.  Catheter and wire were removed.  Patient will be scheduled for right femoropopliteal bypass and will subsequently need evaluation of his common iliac artery aneurysm in the future.  He tolerated procedure without immediate complication.  Minx device was deployed in the groin sheath removed.  Contrast: 50 cc C02 300 cc   Brandon C. Donzetta Matters, MD [facility] [place] Office: [phone] Pager: [phone]

## 2018-09-19 NOTE — Discharge Instructions (Signed)

## 2018-09-19 NOTE — H&P (Signed)
   History and Physical Update  The patient was interviewed and re-examined.  The patient's previous History and Physical has been reviewed and is unchanged from recent office visit. Plan for aortogram possible intervention right lower extremity and right common iliac artery aneurysm.   Namrata Dangler C. Donzetta Matters, MD [facility] [place] Office: [phone] Pager: [phone]  09/19/2018, 7:39 AM

## 2018-09-20 ENCOUNTER — Encounter (HOSPITAL_COMMUNITY): Payer: Self-pay | Admitting: Vascular Surgery

## 2018-09-20 LAB — POCT I-STAT, CHEM 8
BUN: 26 mg/dL — ABNORMAL HIGH (ref 8–23)
Calcium, Ion: 1.29 mmol/L (ref 1.15–1.40)
Chloride: 105 mmol/L (ref 98–111)
Creatinine, Ser: 1.9 mg/dL — ABNORMAL HIGH (ref 0.61–1.24)
Glucose, Bld: 208 mg/dL — ABNORMAL HIGH (ref 70–99)
HCT: 47 % (ref 39.0–52.0)
Hemoglobin: 16 g/dL (ref 13.0–17.0)
Potassium: 4.4 mmol/L (ref 3.5–5.1)
Sodium: 138 mmol/L (ref 135–145)
TCO2: 25 mmol/L (ref 22–32)

## 2018-11-07 ENCOUNTER — Telehealth: Payer: Self-pay | Admitting: *Deleted

## 2018-11-07 NOTE — Telephone Encounter (Signed)
I have been trying to work with the [facility] for 3 weeks to get this patient a cardiac clearance for his right fem-pop surgery that was tentatively scheduled on 11-10-2018. Mr. Happ evidently had a stress test and EKG at their [facility] [phone] ext [phone]) and was seen by his PCP, Dr. Arletha Grippe. This PCP then referred him to the cardiology office within the [facility], Dr. Carney Bern [phone] Ext [phone]). I spoke to them again today and they said that they need to see the patient for a consult; they have tried to call him numerous times and have sent him a letter requesting that he contact them to schedule this visit. They have received no reply from the patient or anybody on his behalf (pt has had laryngectomy). Unfortunately, Dr. Donzetta Matters can not do his surgery without this clearance. I also tried to get the patient on the phone today (he has a mechanical voicebox), and could not reach him. I will place his case in our pending file.

## 2018-11-08 ENCOUNTER — Encounter: Payer: Self-pay | Admitting: *Deleted

## 2018-11-10 ENCOUNTER — Encounter (HOSPITAL_COMMUNITY): Admission: RE | Payer: Self-pay | Source: Home / Self Care

## 2018-11-10 ENCOUNTER — Inpatient Hospital Stay (HOSPITAL_COMMUNITY)
Admission: RE | Admit: 2018-11-10 | Payer: No Typology Code available for payment source | Source: Home / Self Care | Admitting: Vascular Surgery

## 2018-11-10 SURGERY — BYPASS GRAFT FEMORAL-POPLITEAL ARTERY
Anesthesia: General | Laterality: Right

## 2019-01-19 ENCOUNTER — Other Ambulatory Visit: Payer: Self-pay | Admitting: *Deleted

## 2019-02-22 ENCOUNTER — Telehealth: Payer: Self-pay

## 2019-02-22 NOTE — Telephone Encounter (Signed)
Pt came by the office today to say that he did not want to proceed with doing the procedure on the 15th with Dr Donzetta Matters. He said that as long as it is not life or death at the moment he would prefer to wait. We discussed possible complications by waiting and he still insisted.   Spoke with Katrina at the [place] as well and advised her of his decision. She said that they agree and want to respect his desires.   Will notify Dr Donzetta Matters on Friday when he is in the office.   York Cerise, CMA

## 2019-02-28 ENCOUNTER — Inpatient Hospital Stay (HOSPITAL_COMMUNITY)
Admission: RE | Admit: 2019-02-28 | Payer: No Typology Code available for payment source | Source: Home / Self Care | Admitting: Vascular Surgery

## 2019-02-28 ENCOUNTER — Encounter (HOSPITAL_COMMUNITY): Admission: RE | Payer: Self-pay | Source: Home / Self Care

## 2019-02-28 SURGERY — BYPASS GRAFT FEMORAL-POPLITEAL ARTERY
Anesthesia: General | Laterality: Right

## 2019-03-22 IMAGING — CT CT NECK W/ CM
4 of 7 series · 13 of 33 positions shown, 15 images · IV contrast (iopamidol)
Comparison: CT neck 05/30/2017 and CT chest 05/30/2017.

CLINICAL DATA: Hypoxia.  Laryngeal mass.

EXAM:
CT NECK WITH CONTRAST
TECHNIQUE: Multidetector CT imaging of the neck was performed using the
standard protocol following the bolus administration of intravenous
contrast.
CONTRAST:  75mL XRBIY5-2VV IOPAMIDOL (XRBIY5-2VV) INJECTION 61%

[Series 3: axial neck · axial · 0.39mm/px · z∈[-118,-52]mm · 2 of 101 slices shown]
[im 34/101  bone]
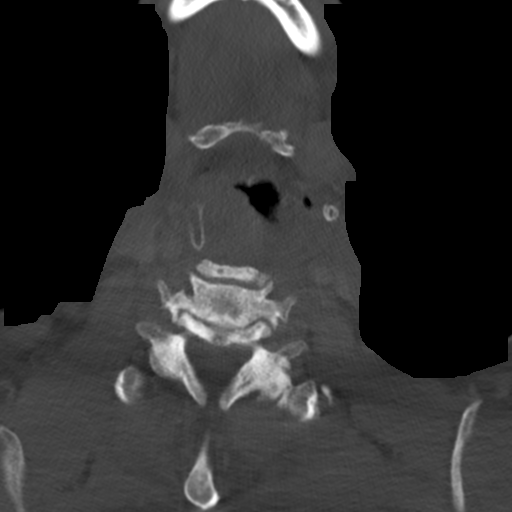
[im 67/101  bone]
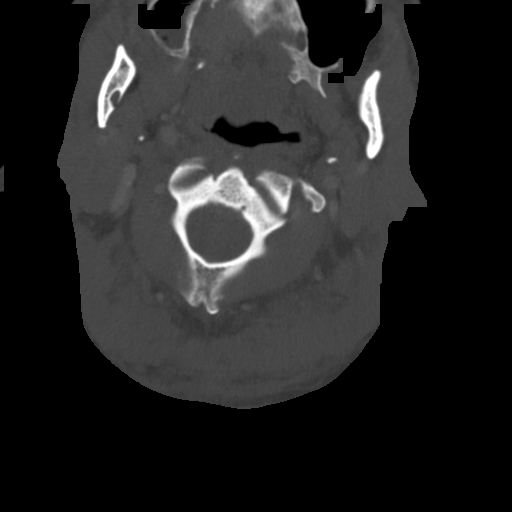

[Series 7: orthogonal ax · axial · 0.39mm/px · z∈[-223,-84]mm · 3 of 157 slices shown, 4 images]
[im 40/157  soft-tissue]
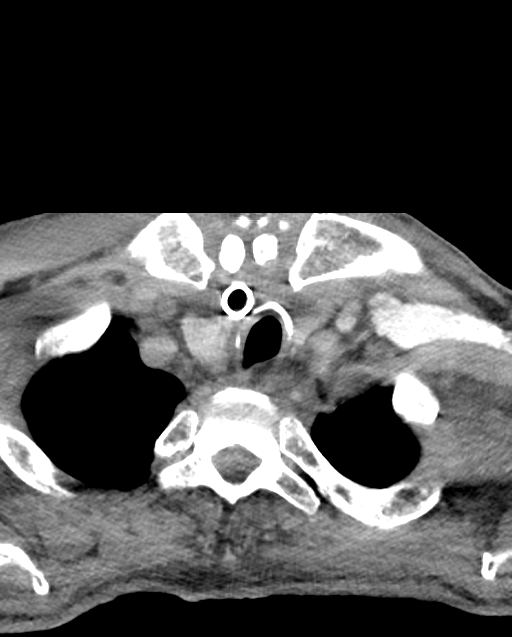
[im 40/157  bone]
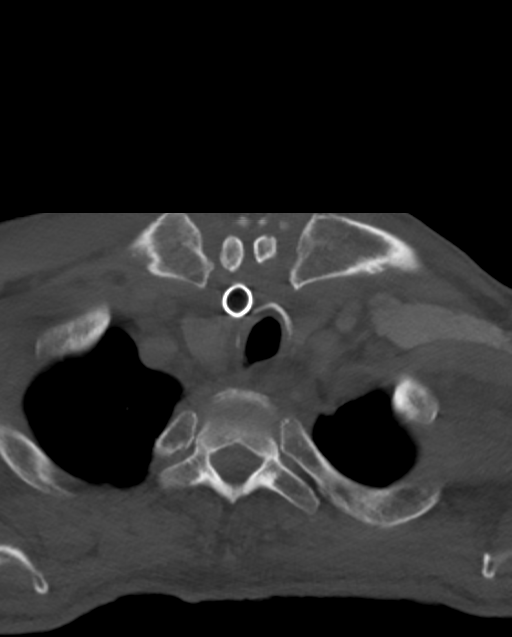
[im 79/157  bone]
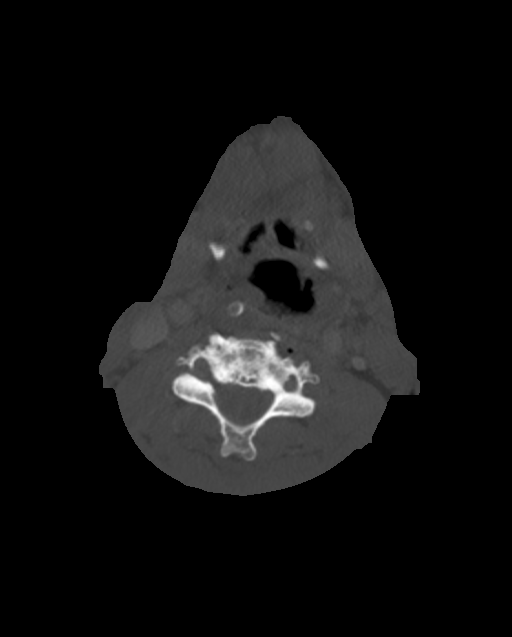
[im 118/157  bone]
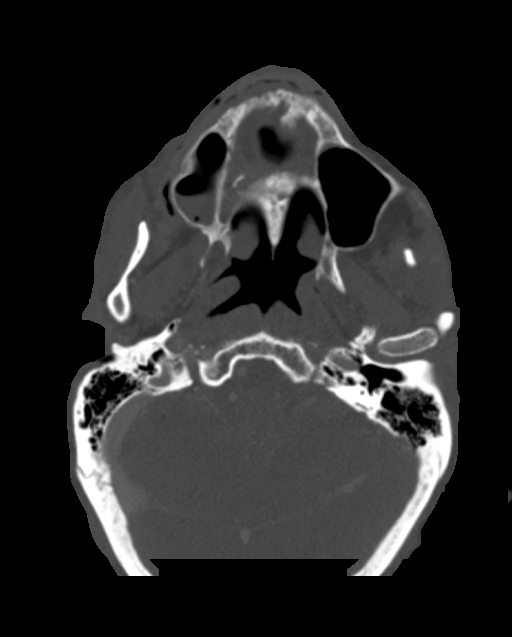

[Series 8: cor neck · coronal · 0.39mm/px · 3 of 124 slices shown]
[im 45/124  bone]
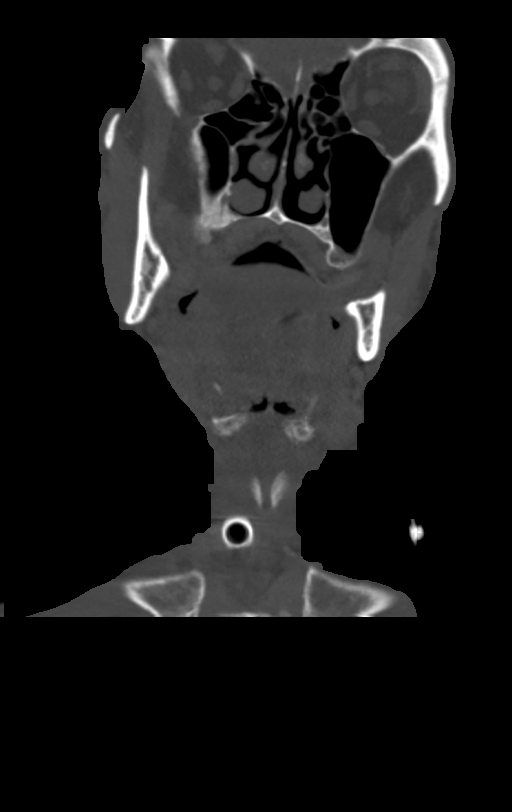
[im 56/124  bone]
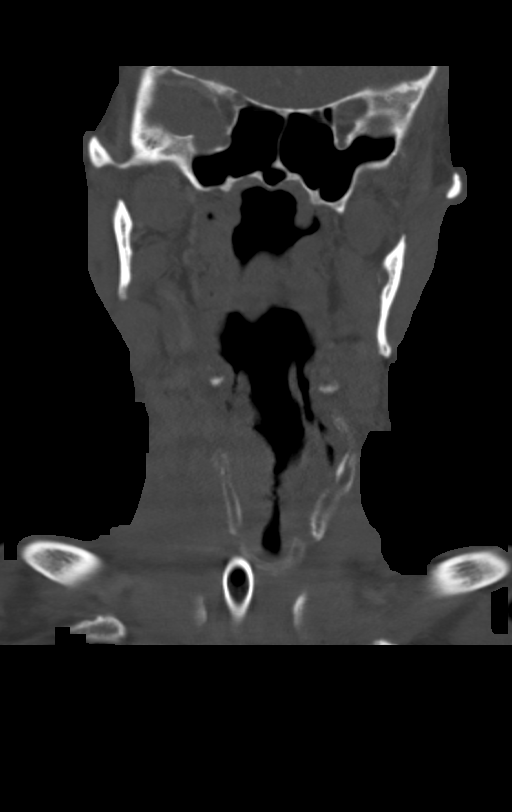
[im 67/124  bone]
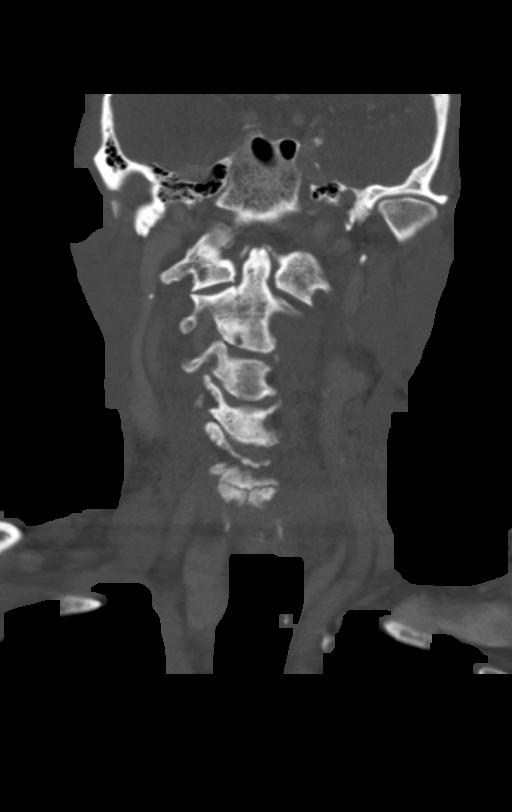

[Series 9: sag neck · sagittal · 0.49mm/px · 5 of 101 slices shown, 6 images]
[im 34/101  bone]
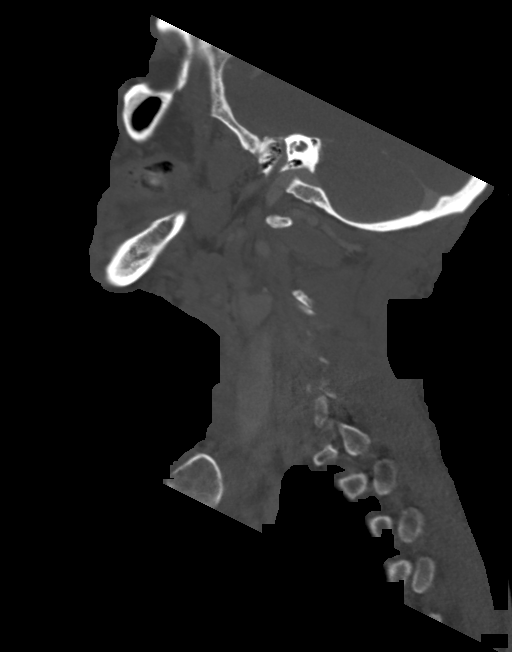
[im 42/101  bone]
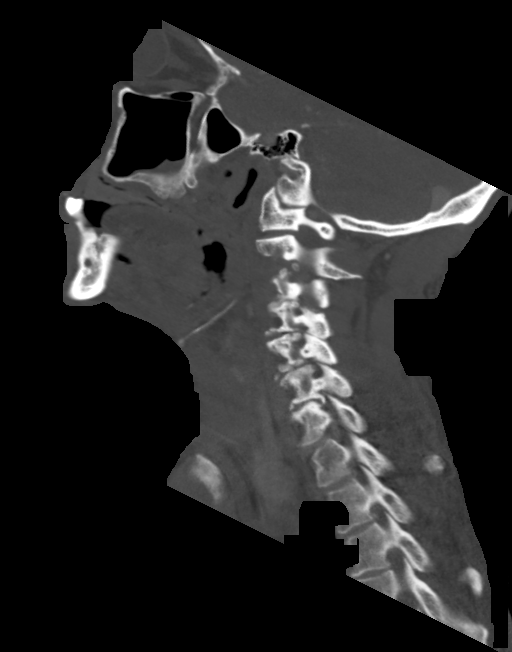
[im 51/101  soft-tissue]
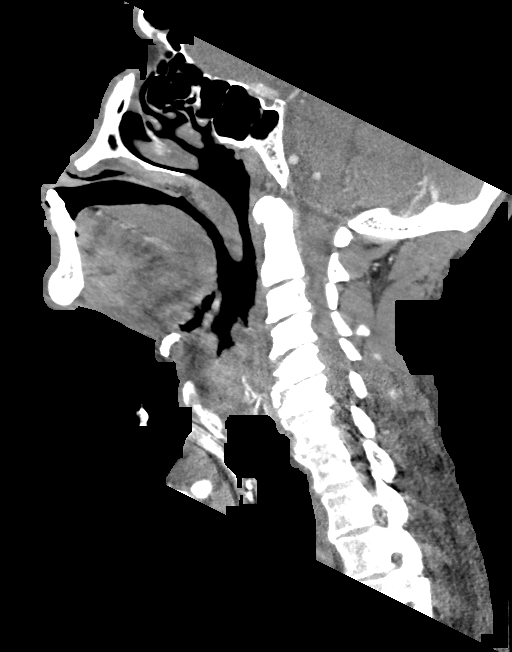
[im 51/101  bone]
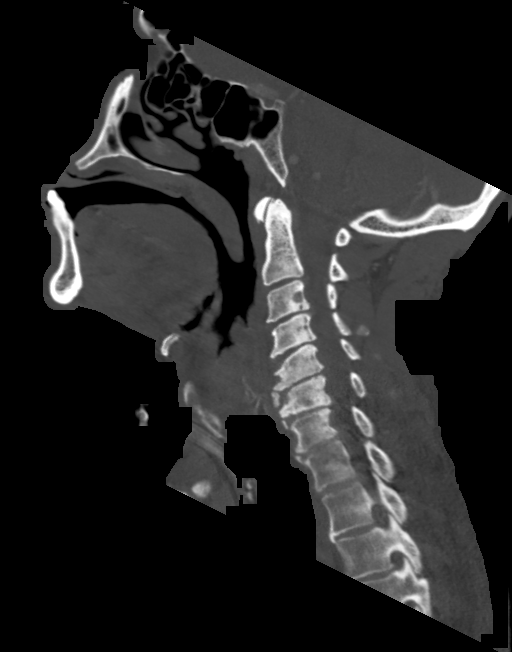
[im 59/101  bone]
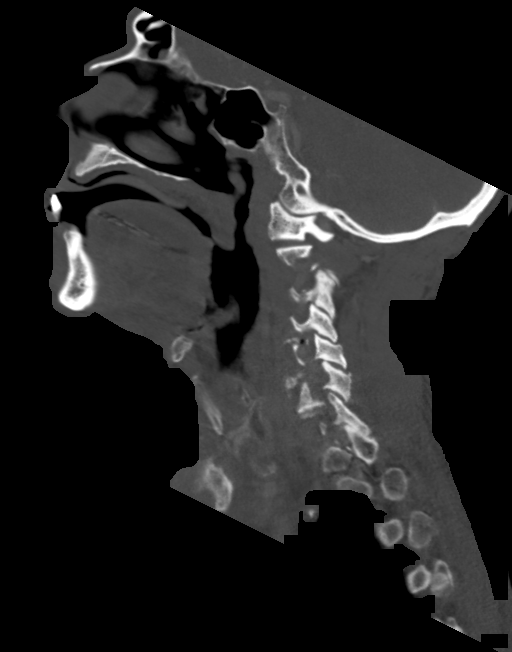
[im 67/101  bone]
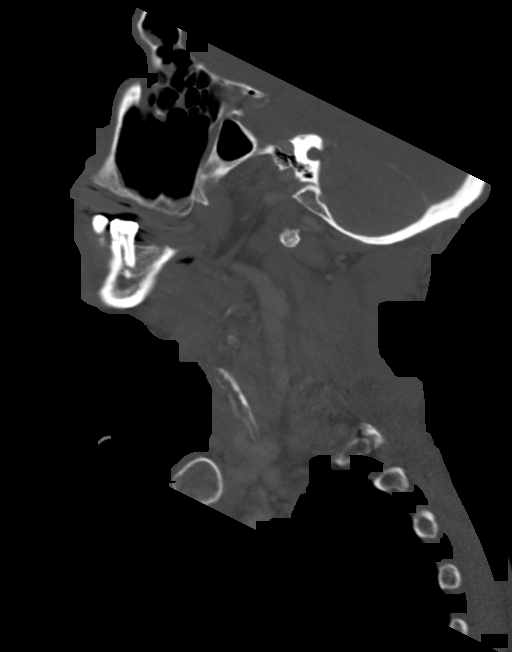

[13 of 33 positions shown; findings below may reference images not displayed]

FINDINGS: Pharynx and larynx: Image quality is poor. No pharyngeal
abnormalities.

Redemonstrated is a glottic and supraglottic mass, likely arising
from the RIGHT vocal cords, circumferential. No arytenoid sclerosis.
Suspected invasion of the paraglottic fat on the RIGHT. Possible
discontinuity/invasion of the RIGHT thyroid cartilage, see series
10, image 72, and series 7, image 95. Significant narrowing of the
airway. No subglottic extension.

Salivary glands: No inflammation, mass, or stone.

Thyroid: Normal.

Lymph nodes: None enlarged or abnormal density.

Vascular: Atherosclerosis.

Limited intracranial: Negative.

Visualized orbits: Negative.

Mastoids and visualized paranasal sinuses: Layering RIGHT maxillary
sinus fluid. Mastoids are clear

Skeleton: Spondylosis.  No worrisome osseous findings.

Upper chest: No change from priors.

Other: Tracheostomy tube is partially outside the trachea.
IMPRESSION: RIGHT glottic and supraglottic mass, narrowing the airway, with
suspected invasion the paraglottic fat on the RIGHT. Possible
discontinuity/invasion of the RIGHT thyroid cartilage.

Tracheostomy tube appears to be only partially within the trachea. A
call is in to the ordering provider.

## 2019-03-23 IMAGING — XA IR PERC PLACEMENT GASTROSTOMY
3 series · 13 of 14 positions shown · non-contrast
Comparison: none

INDICATION: Laryngeal CA, dysphagia

[Series 6: fl - angio · 4 of 39 frames shown (1 of 2)]
[frame 6/39]
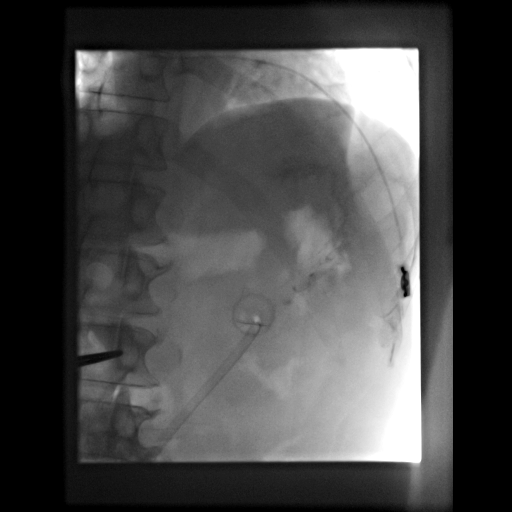
[frame 20/39]
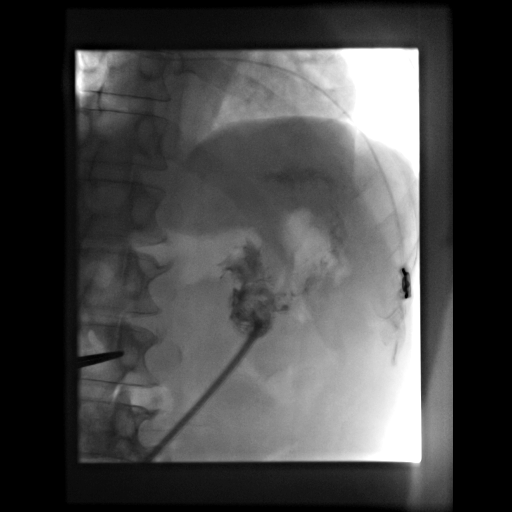
[frame 34/39]
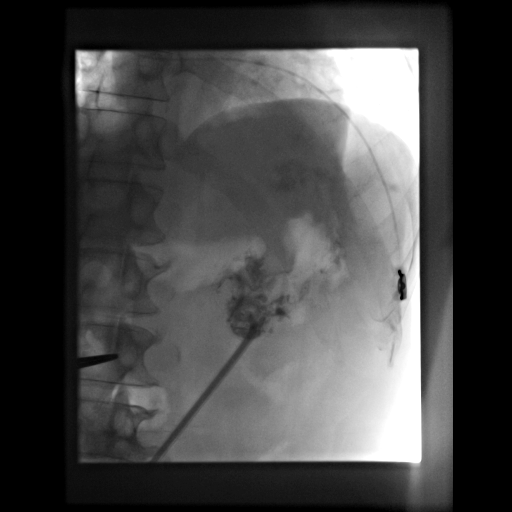
[frame 38/39]
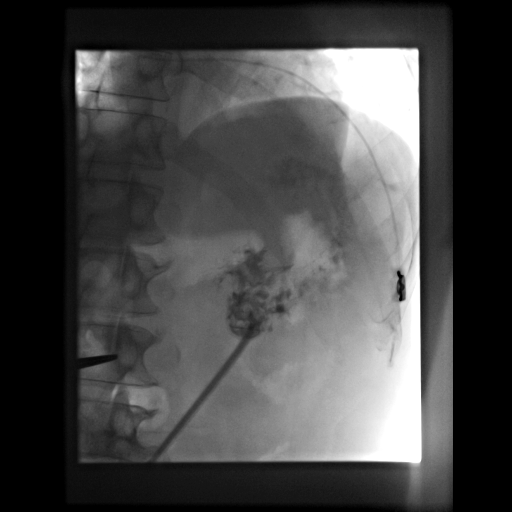

[Series 7: fl - angio · 3 of 19 frames shown (2 of 2)]
[frame 3/19]
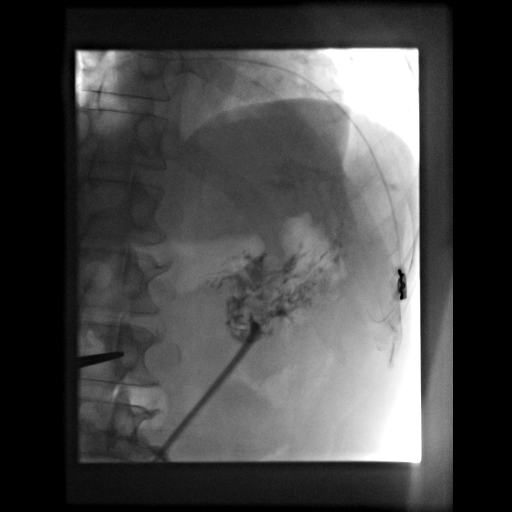
[frame 10/19]
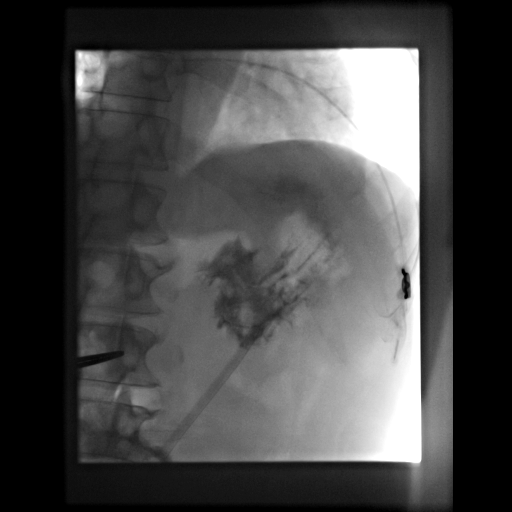
[frame 19/19]
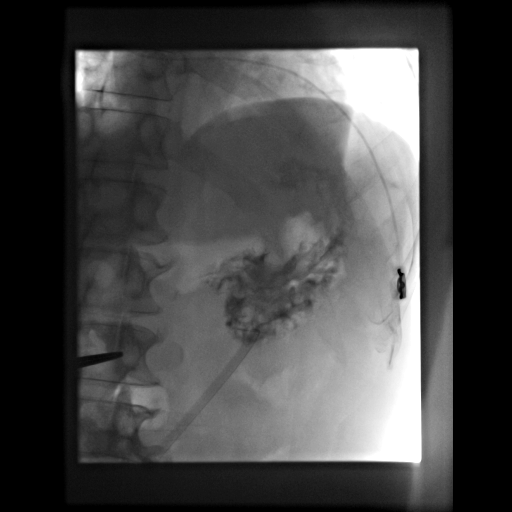

[Series 300: tube placements · 6 of 6 slices shown]
[im 1/6]
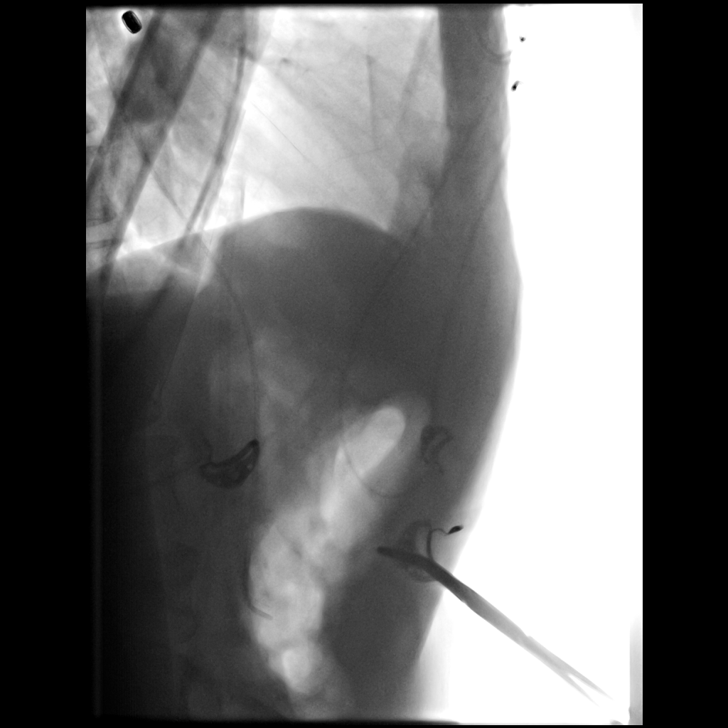
[im 2/6]
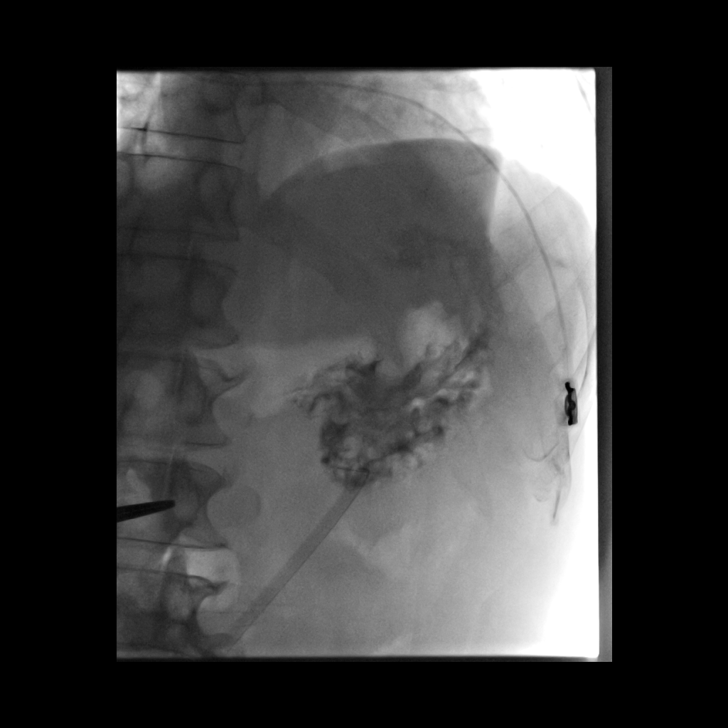
[im 3/6]
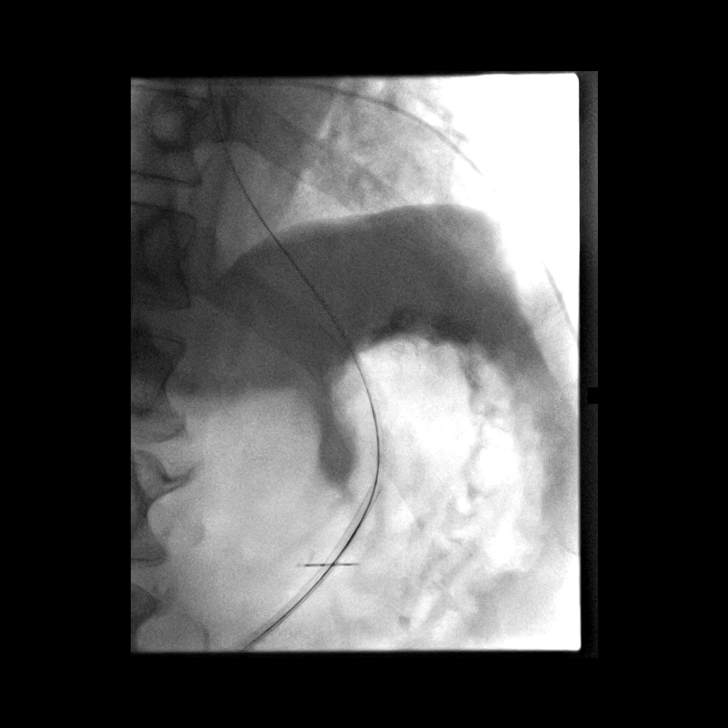
[im 4/6]
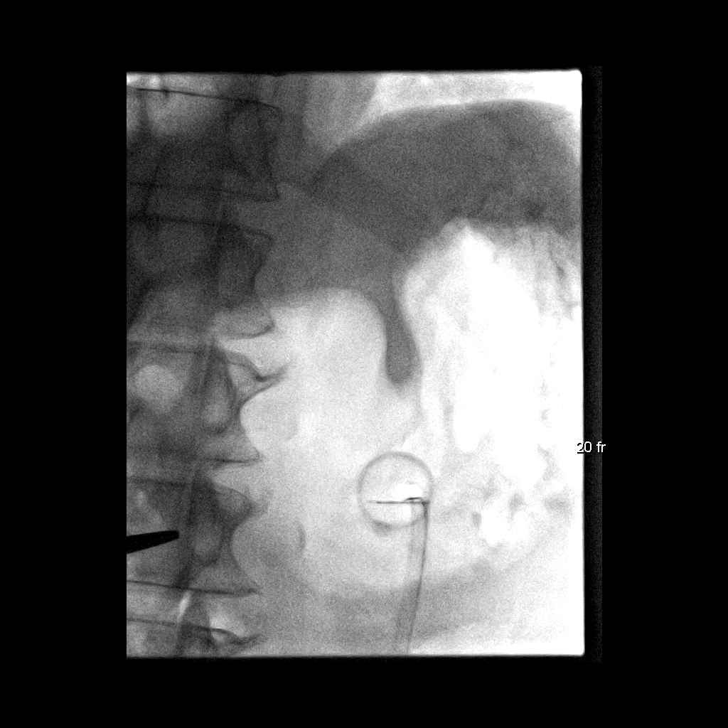
[im 5/6]
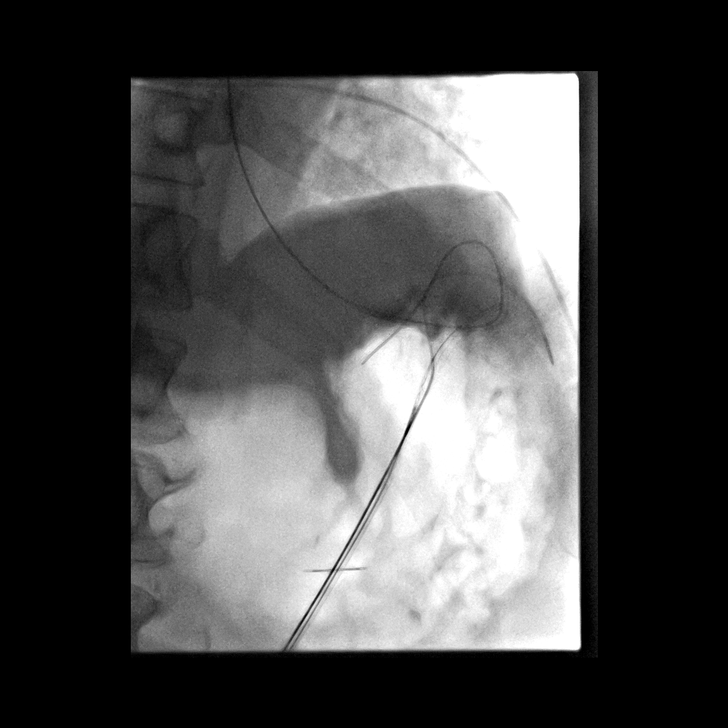
[im 6/6]
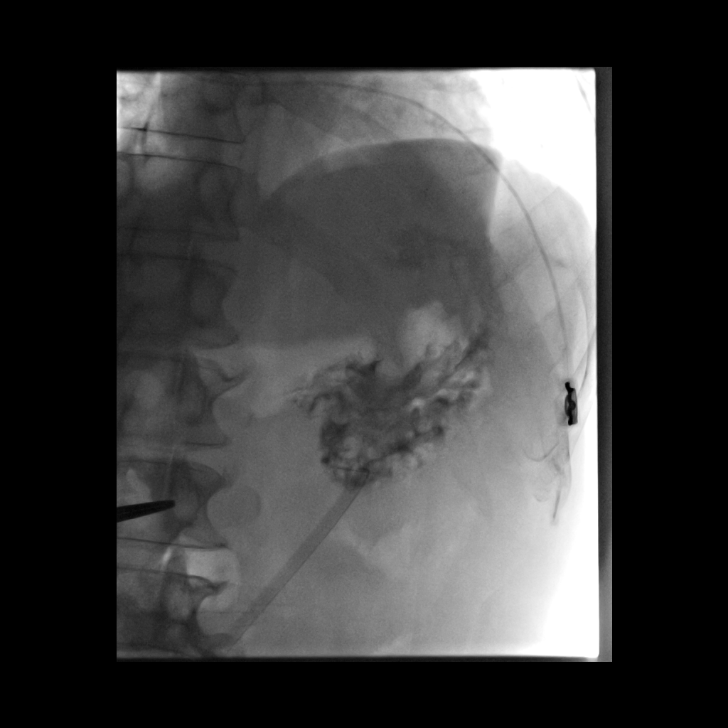

[13 of 14 positions shown; findings below may reference images not displayed]

EXAM:
FLUOROSCOPIC 20 FRENCH PULL-THROUGH GASTROSTOMY

Date:  06/09/2017 06/09/2017 [DATE]

Radiologist:  Dionisio, Montoya

Guidance:  Fluoroscopic

MEDICATIONS:
Ancef 2 g; Antibiotics were administered within 1 hour of the
procedure. Glucagon 0.5 mg IV

ANESTHESIA/SEDATION:
Versed 1 mg IV; Fentanyl 50 mcg IV

Moderate Sedation Time:  15 minutes

The patient was continuously monitored during the procedure by the
interventional radiology nurse under my direct supervision.

CONTRAST:  10mL QZMFTQ-XSS IOPAMIDOL (QZMFTQ-XSS) INJECTION 61% -
administered into the gastric lumen.

FLUOROSCOPY TIME:  Fluoroscopy Time: 2 minutes 18 seconds (18 mGy).

COMPLICATIONS:
None immediate.

PROCEDURE:
Informed consent was obtained from the patient following explanation
of the procedure, risks, benefits and alternatives. The patient
understands, agrees and consents for the procedure. All questions
were addressed. A time out was performed.

Maximal barrier sterile technique utilized including caps, mask,
sterile gowns, sterile gloves, large sterile drape, hand hygiene,
and betadine prep.

The left upper quadrant was sterilely prepped and draped. An oral
gastric catheter was inserted into the stomach under fluoroscopy.
The existing nasogastric feeding tube was removed. Air was injected
into the stomach for insufflation and visualization under
fluoroscopy. The air distended stomach was confirmed beneath the
anterior abdominal wall in the frontal and lateral projections.
Under sterile conditions and local anesthesia, a 17 gauge trocar
needle was utilized to access the stomach percutaneously beneath the
left subcostal margin. Needle position was confirmed within the
stomach under biplane fluoroscopy. Contrast injection confirmed
position also. A single T tack was deployed for gastropexy. Over an
Amplatz guide wire, a 9-French sheath was inserted into the stomach.
A snare device was utilized to capture the oral gastric catheter.
The snare device was pulled retrograde from the stomach up the
esophagus and out the oropharynx. The 20-French pull-through
gastrostomy was connected to the snare device and pulled antegrade
through the oropharynx down the esophagus into the stomach and then
through the percutaneous tract external to the patient. The
gastrostomy was assembled externally. Contrast injection confirms
position in the stomach. Images were obtained for documentation. The
patient tolerated procedure well. No immediate complication.
IMPRESSION: Fluoroscopic insertion of a 20-French "pull-through" gastrostomy.

## 2019-05-11 ENCOUNTER — Ambulatory Visit: Payer: No Typology Code available for payment source | Attending: Internal Medicine

## 2019-05-11 DIAGNOSIS — Z23 Encounter for immunization: Secondary | ICD-10-CM | POA: Insufficient documentation

## 2019-05-11 NOTE — Progress Notes (Signed)
   Cov[facility] Vaccination [facility]  Name:  Vincent Hansen    MRN: [id number] DOB: 02-13-1946  05/11/2019  Mr. Vincent Hansen was observed post Covid-19 immunization for 15 minutes without incidence. He was provided with Vaccine Information Sheet and instruction to access the V-Safe system.   Mr. Vincent Hansen was instructed to call 911 with any severe reactions post vaccine: Marland Kitchen Difficulty breathing  . Swelling of your face and throat  . A fast heartbeat  . A bad rash all over your body  . Dizziness and weakness    Immunizations Administered    Name Date Dose VIS Date Route   Pfizer COVID-19 Vaccine 05/11/2019  1:03 PM 0.3 mL 02/24/2019 Intramuscular   Manufacturer: [employer]   Lot: [id number]   [place]: [id number]

## 2019-06-06 ENCOUNTER — Ambulatory Visit: Payer: No Typology Code available for payment source | Attending: Internal Medicine

## 2019-06-06 DIAGNOSIS — Z23 Encounter for immunization: Secondary | ICD-10-CM

## 2019-06-06 NOTE — Progress Notes (Signed)
   Covid-19 Vaccination Clinic  Name:  Vincent Hansen    MRN: [id number] DOB: 01/25/1946  06/06/2019  [name] was observed post Covid-19 immunization for 15 minutes without incident. He was provided with Vaccine Information Sheet and instruction to access the V-Safe system.   [name] was instructed to call 911 with any severe reactions post vaccine: Marland Kitchen Difficulty breathing  . Swelling of face and throat  . A fast heartbeat  . A bad rash all over body  . Dizziness and weakness   Immunizations Administered    Name Date Dose VIS Date Route   Pfizer COVID-19 Vaccine 06/06/2019  8:58 AM 0.3 mL 02/24/2019 Intramuscular   Manufacturer: [employer]   Lot: [id number]   [place]: [id number]

## 2023-03-17 DEATH — deceased
